# Patient Record
Sex: Male | Born: 1945 | Race: White | Hispanic: No | Marital: Married | Smoking: Former smoker
Health system: Southern US, Community
[De-identification: ages and names within clinical notes are randomized; demographics above are authoritative.]

## PROBLEM LIST (undated history)

## (undated) DIAGNOSIS — J708 Respiratory conditions due to other specified external agents: Secondary | ICD-10-CM

## (undated) DIAGNOSIS — J45909 Unspecified asthma, uncomplicated: Secondary | ICD-10-CM

## (undated) DIAGNOSIS — I219 Acute myocardial infarction, unspecified: Secondary | ICD-10-CM

## (undated) DIAGNOSIS — J449 Chronic obstructive pulmonary disease, unspecified: Secondary | ICD-10-CM

## (undated) DIAGNOSIS — I10 Essential (primary) hypertension: Secondary | ICD-10-CM

## (undated) DIAGNOSIS — N400 Enlarged prostate without lower urinary tract symptoms: Secondary | ICD-10-CM

## (undated) DIAGNOSIS — R911 Solitary pulmonary nodule: Secondary | ICD-10-CM

## (undated) DIAGNOSIS — E785 Hyperlipidemia, unspecified: Secondary | ICD-10-CM

## (undated) DIAGNOSIS — IMO0001 Reserved for inherently not codable concepts without codable children: Secondary | ICD-10-CM

## (undated) DIAGNOSIS — Z860100 Personal history of colon polyps, unspecified: Secondary | ICD-10-CM

## (undated) DIAGNOSIS — M1990 Unspecified osteoarthritis, unspecified site: Secondary | ICD-10-CM

## (undated) DIAGNOSIS — G4730 Sleep apnea, unspecified: Secondary | ICD-10-CM

## (undated) DIAGNOSIS — J189 Pneumonia, unspecified organism: Secondary | ICD-10-CM

## (undated) DIAGNOSIS — E1169 Type 2 diabetes mellitus with other specified complication: Secondary | ICD-10-CM

## (undated) DIAGNOSIS — R011 Cardiac murmur, unspecified: Secondary | ICD-10-CM

## (undated) DIAGNOSIS — I2510 Atherosclerotic heart disease of native coronary artery without angina pectoris: Secondary | ICD-10-CM

## (undated) DIAGNOSIS — M5126 Other intervertebral disc displacement, lumbar region: Secondary | ICD-10-CM

## (undated) DIAGNOSIS — Z87442 Personal history of urinary calculi: Secondary | ICD-10-CM

## (undated) DIAGNOSIS — E782 Mixed hyperlipidemia: Secondary | ICD-10-CM

## (undated) DIAGNOSIS — R0683 Snoring: Secondary | ICD-10-CM

## (undated) DIAGNOSIS — K219 Gastro-esophageal reflux disease without esophagitis: Secondary | ICD-10-CM

## (undated) DIAGNOSIS — T594X1A Toxic effect of chlorine gas, accidental (unintentional), initial encounter: Secondary | ICD-10-CM

## (undated) HISTORY — DX: Personal history of colonic polyps: Z86.010

## (undated) HISTORY — DX: Personal history of colon polyps, unspecified: Z86.0100

## (undated) HISTORY — PX: SPINE SURGERY: SHX786

## (undated) HISTORY — DX: Hyperlipidemia, unspecified: E78.5

## (undated) HISTORY — PX: TONSILLECTOMY: SUR1361

## (undated) HISTORY — DX: Benign prostatic hyperplasia without lower urinary tract symptoms: N40.0

## (undated) HISTORY — DX: Chronic obstructive pulmonary disease, unspecified: J44.9

## (undated) HISTORY — DX: Unspecified osteoarthritis, unspecified site: M19.90

## (undated) HISTORY — DX: Solitary pulmonary nodule: R91.1

## (undated) HISTORY — DX: Snoring: R06.83

## (undated) HISTORY — DX: Pneumonia, unspecified organism: J18.9

## (undated) HISTORY — DX: Atherosclerotic heart disease of native coronary artery without angina pectoris: I25.10

## (undated) HISTORY — DX: Essential (primary) hypertension: I10

## (undated) HISTORY — PX: CARDIOVASCULAR STRESS TEST: SHX262

---

## 1995-07-06 HISTORY — PX: OTHER SURGICAL HISTORY: SHX169

## 1996-07-05 DIAGNOSIS — J708 Respiratory conditions due to other specified external agents: Secondary | ICD-10-CM

## 1996-07-05 HISTORY — DX: Respiratory conditions due to other specified external agents: J70.8

## 1998-06-13 ENCOUNTER — Ambulatory Visit (HOSPITAL_COMMUNITY): Admission: RE | Admit: 1998-06-13 | Discharge: 1998-06-13 | Payer: Self-pay | Admitting: Family Medicine

## 1998-06-13 ENCOUNTER — Encounter: Payer: Self-pay | Admitting: Family Medicine

## 1998-08-13 ENCOUNTER — Ambulatory Visit (HOSPITAL_COMMUNITY): Admission: RE | Admit: 1998-08-13 | Discharge: 1998-08-13 | Payer: Self-pay | Admitting: Cardiology

## 1998-08-13 ENCOUNTER — Encounter: Payer: Self-pay | Admitting: Critical Care Medicine

## 1998-09-05 ENCOUNTER — Ambulatory Visit (HOSPITAL_COMMUNITY): Admission: RE | Admit: 1998-09-05 | Discharge: 1998-09-05 | Payer: Self-pay | Admitting: Cardiovascular Disease

## 1999-07-06 HISTORY — PX: OTHER SURGICAL HISTORY: SHX169

## 2004-01-29 ENCOUNTER — Encounter: Payer: Self-pay | Admitting: Internal Medicine

## 2004-05-18 ENCOUNTER — Ambulatory Visit: Payer: Self-pay | Admitting: Internal Medicine

## 2004-05-28 ENCOUNTER — Emergency Department (HOSPITAL_COMMUNITY): Admission: EM | Admit: 2004-05-28 | Discharge: 2004-05-29 | Payer: Self-pay | Admitting: Emergency Medicine

## 2004-06-24 ENCOUNTER — Ambulatory Visit: Payer: Self-pay | Admitting: Internal Medicine

## 2004-07-05 DIAGNOSIS — I2510 Atherosclerotic heart disease of native coronary artery without angina pectoris: Secondary | ICD-10-CM

## 2004-07-05 HISTORY — DX: Atherosclerotic heart disease of native coronary artery without angina pectoris: I25.10

## 2004-07-17 ENCOUNTER — Ambulatory Visit: Payer: Self-pay | Admitting: Cardiology

## 2004-07-28 ENCOUNTER — Ambulatory Visit: Payer: Self-pay

## 2004-07-28 ENCOUNTER — Ambulatory Visit: Payer: Self-pay | Admitting: Cardiology

## 2004-07-31 ENCOUNTER — Inpatient Hospital Stay (HOSPITAL_BASED_OUTPATIENT_CLINIC_OR_DEPARTMENT_OTHER): Admission: RE | Admit: 2004-07-31 | Discharge: 2004-07-31 | Payer: Self-pay | Admitting: Cardiology

## 2004-07-31 ENCOUNTER — Ambulatory Visit: Payer: Self-pay | Admitting: Cardiology

## 2004-08-13 ENCOUNTER — Ambulatory Visit: Payer: Self-pay | Admitting: Cardiology

## 2004-08-17 ENCOUNTER — Ambulatory Visit: Payer: Self-pay | Admitting: Internal Medicine

## 2004-09-16 ENCOUNTER — Ambulatory Visit: Payer: Self-pay | Admitting: Internal Medicine

## 2004-11-10 ENCOUNTER — Ambulatory Visit: Payer: Self-pay | Admitting: Internal Medicine

## 2004-11-23 ENCOUNTER — Ambulatory Visit: Payer: Self-pay | Admitting: Internal Medicine

## 2005-01-19 ENCOUNTER — Ambulatory Visit: Payer: Self-pay | Admitting: Internal Medicine

## 2005-02-23 ENCOUNTER — Ambulatory Visit: Payer: Self-pay | Admitting: Internal Medicine

## 2005-03-18 ENCOUNTER — Ambulatory Visit: Payer: Self-pay | Admitting: Internal Medicine

## 2005-05-13 ENCOUNTER — Ambulatory Visit: Payer: Self-pay | Admitting: Internal Medicine

## 2005-05-31 ENCOUNTER — Emergency Department (HOSPITAL_COMMUNITY): Admission: EM | Admit: 2005-05-31 | Discharge: 2005-06-01 | Payer: Self-pay | Admitting: Emergency Medicine

## 2005-06-07 ENCOUNTER — Ambulatory Visit: Payer: Self-pay | Admitting: Internal Medicine

## 2005-06-14 ENCOUNTER — Ambulatory Visit: Payer: Self-pay | Admitting: Pulmonary Disease

## 2005-06-18 ENCOUNTER — Encounter: Payer: Self-pay | Admitting: Internal Medicine

## 2005-06-18 ENCOUNTER — Ambulatory Visit: Payer: Self-pay | Admitting: Cardiology

## 2005-07-20 ENCOUNTER — Ambulatory Visit: Payer: Self-pay | Admitting: Internal Medicine

## 2005-07-29 ENCOUNTER — Ambulatory Visit: Payer: Self-pay | Admitting: Internal Medicine

## 2005-12-02 ENCOUNTER — Ambulatory Visit: Payer: Self-pay | Admitting: Gastroenterology

## 2005-12-24 ENCOUNTER — Encounter (INDEPENDENT_AMBULATORY_CARE_PROVIDER_SITE_OTHER): Payer: Self-pay | Admitting: *Deleted

## 2005-12-24 ENCOUNTER — Ambulatory Visit: Payer: Self-pay | Admitting: Gastroenterology

## 2006-01-19 ENCOUNTER — Ambulatory Visit: Payer: Self-pay | Admitting: Internal Medicine

## 2006-03-22 ENCOUNTER — Ambulatory Visit: Payer: Self-pay | Admitting: Internal Medicine

## 2006-04-21 ENCOUNTER — Ambulatory Visit: Payer: Self-pay | Admitting: Internal Medicine

## 2006-08-22 ENCOUNTER — Ambulatory Visit: Payer: Self-pay | Admitting: Internal Medicine

## 2006-08-22 LAB — CONVERTED CEMR LAB
ALT: 34 units/L (ref 0–40)
Albumin: 3.8 g/dL (ref 3.5–5.2)
BUN: 19 mg/dL (ref 6–23)
CO2: 31 meq/L (ref 19–32)
Calcium: 9.6 mg/dL (ref 8.4–10.5)
Chloride: 104 meq/L (ref 96–112)
Cholesterol: 118 mg/dL (ref 0–200)
Creatinine, Ser: 1.2 mg/dL (ref 0.4–1.5)
Creatinine,U: 378.8 mg/dL
GFR calc Af Amer: 80 mL/min
GFR calc non Af Amer: 66 mL/min
Glucose, Bld: 220 mg/dL — ABNORMAL HIGH (ref 70–99)
HDL: 36.1 mg/dL — ABNORMAL LOW (ref >39.0)
Hgb A1c MFr Bld: 10.5 % — ABNORMAL HIGH (ref 4.6–6.0)
LDL Cholesterol: 43 mg/dL (ref 0–99)
Microalb Creat Ratio: 11.6 mg/g (ref 0.0–30.0)
Microalb, Ur: 4.4 mg/dL — ABNORMAL HIGH (ref 0.0–1.9)
Phosphorus: 3.5 mg/dL (ref 2.3–4.6)
Potassium: 4.7 meq/L (ref 3.5–5.1)
Sodium: 143 meq/L (ref 135–145)
Total CHOL/HDL Ratio: 3.3
Triglycerides: 194 mg/dL — ABNORMAL HIGH (ref 0–149)
VLDL: 39 mg/dL (ref 0–40)

## 2006-09-05 ENCOUNTER — Ambulatory Visit: Payer: Self-pay | Admitting: Internal Medicine

## 2006-09-16 ENCOUNTER — Ambulatory Visit: Payer: Self-pay | Admitting: Endocrinology

## 2006-09-26 ENCOUNTER — Ambulatory Visit: Payer: Self-pay | Admitting: Endocrinology

## 2006-10-24 ENCOUNTER — Ambulatory Visit: Payer: Self-pay | Admitting: Endocrinology

## 2006-11-21 ENCOUNTER — Ambulatory Visit: Payer: Self-pay | Admitting: Endocrinology

## 2006-12-27 ENCOUNTER — Encounter: Payer: Self-pay | Admitting: Internal Medicine

## 2007-01-23 ENCOUNTER — Ambulatory Visit: Payer: Self-pay | Admitting: Endocrinology

## 2007-01-23 LAB — CONVERTED CEMR LAB
Creatinine,U: 206.9 mg/dL
Hgb A1c MFr Bld: 7.4 % — ABNORMAL HIGH (ref 4.6–6.0)
Microalb Creat Ratio: 3.4 mg/g (ref 0.0–30.0)
Microalb, Ur: 0.7 mg/dL (ref 0.0–1.9)

## 2007-02-02 ENCOUNTER — Encounter: Payer: Self-pay | Admitting: Internal Medicine

## 2007-02-02 DIAGNOSIS — I2510 Atherosclerotic heart disease of native coronary artery without angina pectoris: Secondary | ICD-10-CM

## 2007-02-02 DIAGNOSIS — N529 Male erectile dysfunction, unspecified: Secondary | ICD-10-CM

## 2007-02-14 DIAGNOSIS — E119 Type 2 diabetes mellitus without complications: Secondary | ICD-10-CM | POA: Insufficient documentation

## 2007-02-14 DIAGNOSIS — I10 Essential (primary) hypertension: Secondary | ICD-10-CM

## 2007-02-14 DIAGNOSIS — E785 Hyperlipidemia, unspecified: Secondary | ICD-10-CM | POA: Insufficient documentation

## 2007-02-14 DIAGNOSIS — J309 Allergic rhinitis, unspecified: Secondary | ICD-10-CM | POA: Insufficient documentation

## 2007-04-03 ENCOUNTER — Ambulatory Visit: Payer: Self-pay | Admitting: Internal Medicine

## 2007-04-03 LAB — CONVERTED CEMR LAB: PSA: 0.75 ng/mL (ref 0.10–4.00)

## 2007-05-19 ENCOUNTER — Ambulatory Visit: Payer: Self-pay | Admitting: Internal Medicine

## 2007-06-26 ENCOUNTER — Ambulatory Visit: Payer: Self-pay | Admitting: Endocrinology

## 2007-06-26 DIAGNOSIS — R05 Cough: Secondary | ICD-10-CM

## 2007-06-26 LAB — CONVERTED CEMR LAB: Hgb A1c MFr Bld: 7.2 % — ABNORMAL HIGH (ref 4.6–6.0)

## 2007-06-27 ENCOUNTER — Telehealth: Payer: Self-pay | Admitting: Endocrinology

## 2007-07-12 ENCOUNTER — Telehealth (INDEPENDENT_AMBULATORY_CARE_PROVIDER_SITE_OTHER): Payer: Self-pay | Admitting: *Deleted

## 2007-07-13 ENCOUNTER — Telehealth (INDEPENDENT_AMBULATORY_CARE_PROVIDER_SITE_OTHER): Payer: Self-pay | Admitting: *Deleted

## 2007-09-25 ENCOUNTER — Ambulatory Visit: Payer: Self-pay | Admitting: Endocrinology

## 2007-09-25 LAB — CONVERTED CEMR LAB: Hgb A1c MFr Bld: 6.8 % — ABNORMAL HIGH (ref 4.6–6.0)

## 2007-09-26 ENCOUNTER — Ambulatory Visit: Payer: Self-pay | Admitting: Cardiology

## 2007-10-03 ENCOUNTER — Telehealth (INDEPENDENT_AMBULATORY_CARE_PROVIDER_SITE_OTHER): Payer: Self-pay | Admitting: *Deleted

## 2007-10-06 ENCOUNTER — Encounter: Payer: Self-pay | Admitting: Endocrinology

## 2007-10-06 ENCOUNTER — Ambulatory Visit: Payer: Self-pay

## 2007-10-09 ENCOUNTER — Ambulatory Visit: Payer: Self-pay

## 2007-10-19 ENCOUNTER — Telehealth: Payer: Self-pay | Admitting: Endocrinology

## 2007-10-23 ENCOUNTER — Telehealth (INDEPENDENT_AMBULATORY_CARE_PROVIDER_SITE_OTHER): Payer: Self-pay | Admitting: *Deleted

## 2007-10-30 ENCOUNTER — Ambulatory Visit: Payer: Self-pay | Admitting: Internal Medicine

## 2007-10-30 DIAGNOSIS — J984 Other disorders of lung: Secondary | ICD-10-CM

## 2007-10-30 DIAGNOSIS — R0602 Shortness of breath: Secondary | ICD-10-CM | POA: Insufficient documentation

## 2007-10-31 ENCOUNTER — Telehealth (INDEPENDENT_AMBULATORY_CARE_PROVIDER_SITE_OTHER): Payer: Self-pay | Admitting: *Deleted

## 2007-11-06 ENCOUNTER — Telehealth: Payer: Self-pay | Admitting: Internal Medicine

## 2007-11-06 ENCOUNTER — Ambulatory Visit: Payer: Self-pay | Admitting: Internal Medicine

## 2007-11-08 ENCOUNTER — Telehealth (INDEPENDENT_AMBULATORY_CARE_PROVIDER_SITE_OTHER): Payer: Self-pay | Admitting: *Deleted

## 2007-11-13 ENCOUNTER — Ambulatory Visit: Payer: Self-pay | Admitting: Internal Medicine

## 2007-12-04 ENCOUNTER — Ambulatory Visit: Payer: Self-pay | Admitting: Internal Medicine

## 2007-12-04 DIAGNOSIS — J439 Emphysema, unspecified: Secondary | ICD-10-CM | POA: Insufficient documentation

## 2007-12-04 DIAGNOSIS — I270 Primary pulmonary hypertension: Secondary | ICD-10-CM

## 2007-12-25 ENCOUNTER — Ambulatory Visit: Payer: Self-pay | Admitting: Endocrinology

## 2007-12-25 LAB — CONVERTED CEMR LAB: Hgb A1c MFr Bld: 7.2 % — ABNORMAL HIGH (ref 4.6–6.0)

## 2008-01-22 ENCOUNTER — Ambulatory Visit: Payer: Self-pay | Admitting: Internal Medicine

## 2008-01-24 LAB — CONVERTED CEMR LAB
ALT: 33 units/L (ref 0–53)
AST: 26 units/L (ref 0–37)
Albumin: 3.8 g/dL (ref 3.5–5.2)
Alkaline Phosphatase: 60 units/L (ref 39–117)
BUN: 21 mg/dL (ref 6–23)
Basophils Absolute: 0 10*3/uL (ref 0.0–0.1)
Basophils Relative: 0.2 % (ref 0.0–3.0)
Bilirubin, Direct: 0.1 mg/dL (ref 0.0–0.3)
CO2: 28 meq/L (ref 19–32)
Calcium: 9 mg/dL (ref 8.4–10.5)
Chloride: 107 meq/L (ref 96–112)
Cholesterol: 101 mg/dL (ref 0–200)
Creatinine, Ser: 1.4 mg/dL (ref 0.4–1.5)
Eosinophils Absolute: 0.1 10*3/uL (ref 0.0–0.7)
Eosinophils Relative: 0.9 % (ref 0.0–5.0)
GFR calc Af Amer: 67 mL/min
GFR calc non Af Amer: 55 mL/min
Glucose, Bld: 165 mg/dL — ABNORMAL HIGH (ref 70–99)
HCT: 43 % (ref 39.0–52.0)
HDL: 33.5 mg/dL — ABNORMAL LOW (ref >39.0)
Hemoglobin: 14.9 g/dL (ref 13.0–17.0)
LDL Cholesterol: 48 mg/dL (ref 0–99)
Lymphocytes Relative: 14.9 % (ref 12.0–46.0)
MCHC: 34.6 g/dL (ref 30.0–36.0)
MCV: 89.1 fL (ref 78.0–100.0)
Monocytes Absolute: 0.5 10*3/uL (ref 0.1–1.0)
Monocytes Relative: 6.1 % (ref 3.0–12.0)
Neutro Abs: 6.9 10*3/uL (ref 1.4–7.7)
Neutrophils Relative %: 77.9 % — ABNORMAL HIGH (ref 43.0–77.0)
PSA: 1.48 ng/mL (ref 0.10–4.00)
Phosphorus: 3 mg/dL (ref 2.3–4.6)
Platelets: 251 10*3/uL (ref 150–400)
Potassium: 4.3 meq/L (ref 3.5–5.1)
RBC: 4.83 M/uL (ref 4.22–5.81)
RDW: 13 % (ref 11.5–14.6)
Sodium: 142 meq/L (ref 135–145)
TSH: 0.67 microintl units/mL (ref 0.35–5.50)
Total Bilirubin: 0.9 mg/dL (ref 0.3–1.2)
Total CHOL/HDL Ratio: 3
Total Protein: 6.5 g/dL (ref 6.0–8.3)
Triglycerides: 96 mg/dL (ref 0–149)
VLDL: 19 mg/dL (ref 0–40)
WBC: 8.8 10*3/uL (ref 4.5–10.5)

## 2008-04-01 ENCOUNTER — Ambulatory Visit: Payer: Self-pay | Admitting: Endocrinology

## 2008-04-01 LAB — CONVERTED CEMR LAB: Hgb A1c MFr Bld: 7.3 % — ABNORMAL HIGH (ref 4.6–6.0)

## 2008-04-08 ENCOUNTER — Telehealth (INDEPENDENT_AMBULATORY_CARE_PROVIDER_SITE_OTHER): Payer: Self-pay | Admitting: *Deleted

## 2008-07-08 ENCOUNTER — Telehealth: Payer: Self-pay | Admitting: Internal Medicine

## 2008-08-12 ENCOUNTER — Ambulatory Visit: Payer: Self-pay | Admitting: Endocrinology

## 2008-08-12 LAB — CONVERTED CEMR LAB: Hgb A1c MFr Bld: 7.4 % — ABNORMAL HIGH (ref 4.6–6.0)

## 2008-08-26 ENCOUNTER — Telehealth: Payer: Self-pay | Admitting: Internal Medicine

## 2008-09-04 ENCOUNTER — Telehealth (INDEPENDENT_AMBULATORY_CARE_PROVIDER_SITE_OTHER): Payer: Self-pay | Admitting: *Deleted

## 2008-09-09 ENCOUNTER — Telehealth (INDEPENDENT_AMBULATORY_CARE_PROVIDER_SITE_OTHER): Payer: Self-pay | Admitting: *Deleted

## 2008-09-20 ENCOUNTER — Telehealth: Payer: Self-pay | Admitting: Internal Medicine

## 2008-11-20 ENCOUNTER — Telehealth: Payer: Self-pay | Admitting: Internal Medicine

## 2009-01-13 ENCOUNTER — Ambulatory Visit: Payer: Self-pay | Admitting: Endocrinology

## 2009-01-13 LAB — CONVERTED CEMR LAB: Hgb A1c MFr Bld: 7.8 % — ABNORMAL HIGH (ref 4.6–6.5)

## 2009-03-17 ENCOUNTER — Ambulatory Visit: Payer: Self-pay | Admitting: Internal Medicine

## 2009-03-19 LAB — CONVERTED CEMR LAB
ALT: 42 units/L (ref 0–53)
AST: 31 units/L (ref 0–37)
Albumin: 4.2 g/dL (ref 3.5–5.2)
Alkaline Phosphatase: 66 units/L (ref 39–117)
BUN: 18 mg/dL (ref 6–23)
Basophils Absolute: 0 10*3/uL (ref 0.0–0.1)
Basophils Relative: 0.2 % (ref 0.0–3.0)
Bilirubin, Direct: 0.1 mg/dL (ref 0.0–0.3)
CO2: 30 meq/L (ref 19–32)
Calcium: 9.2 mg/dL (ref 8.4–10.5)
Chloride: 106 meq/L (ref 96–112)
Cholesterol: 141 mg/dL (ref 0–200)
Creatinine, Ser: 1.3 mg/dL (ref 0.4–1.5)
Creatinine,U: 155.8 mg/dL
Eosinophils Absolute: 0.1 10*3/uL (ref 0.0–0.7)
Eosinophils Relative: 0.9 % (ref 0.0–5.0)
Glucose, Bld: 197 mg/dL — ABNORMAL HIGH (ref 70–99)
HCT: 47.2 % (ref 39.0–52.0)
HDL: 43.8 mg/dL (ref >39.00)
Hemoglobin: 16.1 g/dL (ref 13.0–17.0)
Hgb A1c MFr Bld: 7.7 % — ABNORMAL HIGH (ref 4.6–6.5)
LDL Cholesterol: 62 mg/dL (ref 0–99)
Lymphocytes Relative: 12.4 % (ref 12.0–46.0)
Lymphs Abs: 1.4 10*3/uL (ref 0.7–4.0)
MCHC: 34 g/dL (ref 30.0–36.0)
MCV: 89.5 fL (ref 78.0–100.0)
Microalb Creat Ratio: 21.8 mg/g (ref 0.0–30.0)
Microalb, Ur: 3.4 mg/dL — ABNORMAL HIGH (ref 0.0–1.9)
Monocytes Absolute: 0.5 10*3/uL (ref 0.1–1.0)
Monocytes Relative: 4.4 % (ref 3.0–12.0)
Neutro Abs: 8.9 10*3/uL — ABNORMAL HIGH (ref 1.4–7.7)
Neutrophils Relative %: 82.1 % — ABNORMAL HIGH (ref 43.0–77.0)
PSA: 0.58 ng/mL (ref 0.10–4.00)
Phosphorus: 2.7 mg/dL (ref 2.3–4.6)
Platelets: 250 10*3/uL (ref 150.0–400.0)
Potassium: 4.3 meq/L (ref 3.5–5.1)
RBC: 5.27 M/uL (ref 4.22–5.81)
RDW: 12.7 % (ref 11.5–14.6)
Sodium: 142 meq/L (ref 135–145)
TSH: 0.75 microintl units/mL (ref 0.35–5.50)
Total Bilirubin: 0.9 mg/dL (ref 0.3–1.2)
Total CHOL/HDL Ratio: 3
Total Protein: 7.6 g/dL (ref 6.0–8.3)
Triglycerides: 174 mg/dL — ABNORMAL HIGH (ref 0.0–149.0)
VLDL: 34.8 mg/dL (ref 0.0–40.0)
WBC: 10.9 10*3/uL — ABNORMAL HIGH (ref 4.5–10.5)

## 2009-05-01 ENCOUNTER — Telehealth: Payer: Self-pay | Admitting: Internal Medicine

## 2009-05-05 ENCOUNTER — Ambulatory Visit: Payer: Self-pay | Admitting: Internal Medicine

## 2009-05-05 DIAGNOSIS — S335XXA Sprain of ligaments of lumbar spine, initial encounter: Secondary | ICD-10-CM

## 2009-05-12 ENCOUNTER — Telehealth: Payer: Self-pay | Admitting: Internal Medicine

## 2009-05-12 ENCOUNTER — Ambulatory Visit: Payer: Self-pay | Admitting: Internal Medicine

## 2009-05-16 ENCOUNTER — Telehealth: Payer: Self-pay | Admitting: Internal Medicine

## 2009-06-16 ENCOUNTER — Telehealth: Payer: Self-pay | Admitting: Internal Medicine

## 2009-06-22 ENCOUNTER — Encounter: Admission: RE | Admit: 2009-06-22 | Discharge: 2009-06-22 | Payer: Self-pay | Admitting: Sports Medicine

## 2009-07-03 ENCOUNTER — Encounter: Admission: RE | Admit: 2009-07-03 | Discharge: 2009-07-03 | Payer: Self-pay | Admitting: Sports Medicine

## 2009-07-25 ENCOUNTER — Encounter: Admission: RE | Admit: 2009-07-25 | Discharge: 2009-07-25 | Payer: Self-pay | Admitting: Sports Medicine

## 2009-08-04 ENCOUNTER — Ambulatory Visit: Payer: Self-pay | Admitting: Endocrinology

## 2009-08-04 LAB — CONVERTED CEMR LAB: Hgb A1c MFr Bld: 7.3 % — ABNORMAL HIGH (ref 4.6–6.5)

## 2009-09-15 ENCOUNTER — Telehealth: Payer: Self-pay | Admitting: Internal Medicine

## 2009-09-29 ENCOUNTER — Telehealth: Payer: Self-pay | Admitting: Endocrinology

## 2009-12-29 ENCOUNTER — Telehealth: Payer: Self-pay | Admitting: Internal Medicine

## 2009-12-29 ENCOUNTER — Encounter: Payer: Self-pay | Admitting: Internal Medicine

## 2010-01-01 ENCOUNTER — Encounter: Payer: Self-pay | Admitting: Internal Medicine

## 2010-01-06 ENCOUNTER — Ambulatory Visit: Payer: Self-pay | Admitting: Endocrinology

## 2010-02-02 ENCOUNTER — Ambulatory Visit: Payer: Self-pay | Admitting: Internal Medicine

## 2010-02-05 LAB — CONVERTED CEMR LAB
ALT: 46 units/L (ref 0–53)
AST: 30 units/L (ref 0–37)
Albumin: 4.1 g/dL (ref 3.5–5.2)
Alkaline Phosphatase: 53 units/L (ref 39–117)
Bilirubin, Direct: 0.1 mg/dL (ref 0.0–0.3)
Cholesterol: 171 mg/dL (ref 0–200)
HDL: 45.1 mg/dL (ref >39.00)
LDL Cholesterol: 90 mg/dL (ref 0–99)
Total Bilirubin: 0.7 mg/dL (ref 0.3–1.2)
Total CHOL/HDL Ratio: 4
Total Protein: 6.9 g/dL (ref 6.0–8.3)
Triglycerides: 180 mg/dL — ABNORMAL HIGH (ref 0.0–149.0)
VLDL: 36 mg/dL (ref 0.0–40.0)

## 2010-04-06 LAB — HM DIABETES EYE EXAM

## 2010-05-14 ENCOUNTER — Ambulatory Visit: Payer: Self-pay | Admitting: Internal Medicine

## 2010-05-14 DIAGNOSIS — N400 Enlarged prostate without lower urinary tract symptoms: Secondary | ICD-10-CM

## 2010-05-14 DIAGNOSIS — M1990 Unspecified osteoarthritis, unspecified site: Secondary | ICD-10-CM | POA: Insufficient documentation

## 2010-05-14 LAB — HM DIABETES FOOT EXAM

## 2010-05-18 LAB — CONVERTED CEMR LAB
ALT: 58 units/L — ABNORMAL HIGH (ref 0–53)
AST: 44 units/L — ABNORMAL HIGH (ref 0–37)
Albumin: 4.4 g/dL (ref 3.5–5.2)
Alkaline Phosphatase: 63 units/L (ref 39–117)
BUN: 15 mg/dL (ref 6–23)
Basophils Absolute: 0 10*3/uL (ref 0.0–0.1)
Basophils Relative: 0.3 % (ref 0.0–3.0)
Bilirubin, Direct: 0.1 mg/dL (ref 0.0–0.3)
CO2: 29 meq/L (ref 19–32)
Calcium: 9.5 mg/dL (ref 8.4–10.5)
Chloride: 102 meq/L (ref 96–112)
Cholesterol: 162 mg/dL (ref 0–200)
Creatinine, Ser: 1.1 mg/dL (ref 0.4–1.5)
Eosinophils Absolute: 0.1 10*3/uL (ref 0.0–0.7)
Eosinophils Relative: 1.2 % (ref 0.0–5.0)
GFR calc non Af Amer: 74.41 mL/min (ref >60)
Glucose, Bld: 153 mg/dL — ABNORMAL HIGH (ref 70–99)
HCT: 46.6 % (ref 39.0–52.0)
HDL: 45.5 mg/dL (ref >39.00)
Hemoglobin: 16.2 g/dL (ref 13.0–17.0)
Hgb A1c MFr Bld: 8.1 % — ABNORMAL HIGH (ref 4.6–6.5)
LDL Cholesterol: 83 mg/dL (ref 0–99)
Lymphocytes Relative: 17.8 % (ref 12.0–46.0)
Lymphs Abs: 1.7 10*3/uL (ref 0.7–4.0)
MCHC: 34.7 g/dL (ref 30.0–36.0)
MCV: 88.8 fL (ref 78.0–100.0)
Monocytes Absolute: 0.4 10*3/uL (ref 0.1–1.0)
Monocytes Relative: 4.4 % (ref 3.0–12.0)
Neutro Abs: 7.3 10*3/uL (ref 1.4–7.7)
Neutrophils Relative %: 76.3 % (ref 43.0–77.0)
PSA: 0.53 ng/mL (ref 0.10–4.00)
Phosphorus: 3.7 mg/dL (ref 2.3–4.6)
Platelets: 282 10*3/uL (ref 150.0–400.0)
Potassium: 4.1 meq/L (ref 3.5–5.1)
RBC: 5.25 M/uL (ref 4.22–5.81)
RDW: 13.7 % (ref 11.5–14.6)
Sodium: 140 meq/L (ref 135–145)
TSH: 1.05 microintl units/mL (ref 0.35–5.50)
Total Bilirubin: 0.7 mg/dL (ref 0.3–1.2)
Total CHOL/HDL Ratio: 4
Total Protein: 7.1 g/dL (ref 6.0–8.3)
Triglycerides: 169 mg/dL — ABNORMAL HIGH (ref 0.0–149.0)
VLDL: 33.8 mg/dL (ref 0.0–40.0)
WBC: 9.5 10*3/uL (ref 4.5–10.5)

## 2010-06-08 ENCOUNTER — Ambulatory Visit: Payer: Self-pay | Admitting: Endocrinology

## 2010-06-22 ENCOUNTER — Telehealth: Payer: Self-pay | Admitting: Endocrinology

## 2010-08-04 NOTE — Progress Notes (Signed)
Summary:  LEVITRA  Phone Note Refill Request Message from:  [facility] on September 15, 2009 11:52 AM  Refills Requested: Medication #1:  LEVITRA 20 MG  TABS As needed Form on your desk, ok to refill?   Method Requested: Fax to Mail Away Pharmacy Initial call taken by: Mervin Hack CMA [facility]),  September 15, 2009 11:52 AM  Follow-up for Phone Call        per Dr. Alphonsus Sias ok to refill, until physical Follow-up by: Mervin Hack CMA [facility]),  September 15, 2009 11:55 AM  Additional Follow-up for Phone Call Additional follow up Details #1::        Rx [facility]ed [facility] Additional Follow-up by: Mervin Hack CMA [facility]),  September 15, 2009 1:07 PM    Prescriptions: LEVITRA 20 MG  TABS (VARDENAFIL HCL) As needed  #30 x 3   Entered by:   Mervin Hack CMA (AAMA)   Authorized by:   Cindee Salt MD   Signed by:   Mervin Hack CMA (AAMA) on 09/15/2009   Method used:   Electronically to        [facility]* (mail-order)             ,          Ph: [phone]       Fax: [phone]   RxID:   [id number]

## 2010-08-04 NOTE — Assessment & Plan Note (Signed)
Summary: RS/D FROM 12/6 DIABETES/#/CD   Vital Signs:  Patient profile:   65 year old male Height:      74.75 inches (189.87 cm) Weight:      308 pounds (140 kg) O2 Sat:      95 % on Room air Temp:     97.0 degrees F (36.11 degrees C) oral Pulse rate:   94 / minute BP sitting:   180 / 84  (left arm) Cuff size:   large  Vitals Entered By: Josph Macho CMA (August 04, 2009 9:13 AM)  O2 Flow:  Room air CC: Follow-up visit/ CF Is Patient Diabetic? Yes   Referring Provider:  Dr. Juanito Doom Primary Provider:  Dr. Tillman Abide  CC:  Follow-up visit/ CF.  History of Present Illness: pt states he feels well in general, except for exacerbation of low-back pain.  he continues to work.  no cbg record, but states cbg's are well-controlled, even after steriod injections.  he has reduced his humalog to 60 units at a time, due to hypoglycemia.    Current Medications (verified): 1)  Clonidine Hcl 0.3 Mg  Tabs (Clonidine Hcl) .... Take One By Mouth Two Times A Day 2)  Lotrel 10-20 Mg  Caps (Amlodipine Besy-Benazepril Hcl) .... Take One By Mouth Once A Day 3)  Allegra 180 Mg  Tabs (Fexofenadine Hcl) .... Take One By Mouth Once A Day 4)  Fish Oil 1000 Mg  Caps (Omega-3 Fatty Acids) .... Take 2 By Mouth Two Times A Day 5)  Multivitamins   Tabs (Multiple Vitamin) .... Take One By Mouth Once A Day 6)  Potassium Chloride 20 Meq  Pack (Potassium Chloride) .... Take One By Mouth Once A Day 7)  Levitra 20 Mg  Tabs (Vardenafil Hcl) .... As Needed 8)  Humalog Pen 100 Unit/ml Soln (Insulin Lispro (Human)) .... Three Times A Day (Qac) 70-70-30 Units 9)  Simvastatin 80 Mg Tabs (Simvastatin) .... Take 1 Tablet By Mouth At Bedtime 10)  Furosemide 20 Mg Tabs (Furosemide) .... Take 1 Tablet By Mouth Every Morning 11)  Onetouch Ultra Test   Strp (Glucose Blood) .... Three Times A Day, 250.01, and Lancets 12)  Easy Touch Pen Needles 31g X 6 Mm Misc (Insulin Pen Needle) .... Any Brand, Tid 13)  Aspirin 81 Mg  Tabs (Aspirin) .... Take 1 By Mouth Once Daily 14)  Carisoprodol 350 Mg Tabs (Carisoprodol) .... 1/2 - 1 Tab By Mouth Three Times A Day As Needed For Muscle Spasm 15)  Ibuprofen 800 Mg Tabs (Ibuprofen) .Marland Kitchen.. 1 Tab By Mouth Three Times A Day With Food As Needed For Pain 16)  Hydrocodone-Acetaminophen 5-325 Mg Tabs (Hydrocodone-Acetaminophen) .Marland Kitchen.. 1 Tab Up To Four Times Daily As Needed For Severe Pain  Past History:  Past Medical History: Last updated: 05/05/2009 Hypertension Coronary artery disease - non obstructive on cath 2006-------------------------------Dr Wall Diabetes mellitus, type II----------------------------------------------------------------------Dr Delfino Lovett stage 2 COPD  - on pfts and ct chest. No desaturation on walking 11/2007. Started on spiriva 5/09 Allergic rhinitis Colonic polyps, hx of Hyperlipidemia Chemical Inhalation (Clorox, Muradic acid) in 1997 during cleanup of garage. Ended up in ER visit. Says CT chest showed "scar tissue" Jan 2006 - RHC - the right atrial pressure was 5 mean.  The right  ventricular pressure was 33/5.  The pulmonary artery pressure was 33/15 with  a mean of 24.  The pulmonary wedge pressure was 11.  Left ventricular  pressure was 122/18.  The aortic pressure was 122/74 with  a mean of 93.  Cardiac output/cardiac index was 5.3/2.1 L/minute/sq m. 6mm pulmonary nodule - stable Dec 2005 through Dec 2006 and May 2009 - no further followup  Review of Systems       he has loist a few lbs, due to his efforts  Physical Exam  General:  normal appearance.   Psych:  Alert and cooperative; normal mood and affect; normal attention span and concentration.   Additional Exam:  Hemoglobin A1C       [H]  7.3 %    Impression & Recommendations:  Problem # 1:  DIABETES MELLITUS, TYPE II (ICD-250.00) well-controlled for his situation  Medications Added to Medication List This Visit: 1)  Humalog Pen 100 Unit/ml Soln (Insulin lispro (human)) .... Three  times a day (qac) 60-60-30 units  Other Orders: TLB-A1C / Hgb A1C (Glycohemoglobin) (83036-A1C) Est. Patient Level III (13086)  Patient Instructions: 1)  take humalog 60 units with each meal (except for 30 with the meal at work). 2)  check your blood sugar 3 times a day.  vary the time of day when you check, between before the 3 meals, and at bedtime.  also check if you have symptoms of your blood sugar being too high or too low.  please keep a record of the readings and bring it to your next appointment here.  please call us sooner if you are having low blood sugar episodes. 3)  return 3 mos.

## 2010-08-04 NOTE — Medication Information (Signed)
Summary: Fax Regarding Simvastatin with Amlodipine Interaction/[facility]  Fax Regarding Simvastatin with Amlodipine Interaction/[facility]   Imported By: Lanelle Bal 01/06/2010 12:28:24  _____________________________________________________________________  External Attachment:    Type:   Image     Comment:   External Document

## 2010-08-04 NOTE — Assessment & Plan Note (Signed)
Summary: FOLLOW UP-LB   Vital Signs:  Patient profile:   65 year old male Height:      75 inches (190.50 cm) Weight:      316 pounds (143.64 kg) BMI:     39.64 O2 Sat:      92 % on Room air Temp:     98.4 degrees F (36.89 degrees C) oral Pulse rate:   90 / minute BP sitting:   140 / 84  (left arm) Cuff size:   large  Vitals Entered By: Brenton Grills CMA [facility]) (June 08, 2010 8:14 AM)  O2 Flow:  Room air CC: Follow-up visit/aj Is Patient Diabetic? Yes   Referring Provider:  Dr. Juanito Doom Primary Provider:  Dr. Tillman Abide  CC:  Follow-up visit/aj.  History of Present Illness: pt states he feels well in general.  no cbg record, but states cbg's are well-controlled.  due to hyperglycemia, he sometimes exceeds the 45 units of humalog at work.    Current Medications (verified): 1)  Pravastatin Sodium 80 Mg Tabs (Pravastatin Sodium) .... Take 1 By Mouth Once Daily 2)  Clonidine Hcl 0.3 Mg  Tabs (Clonidine Hcl) .... Take One By Mouth Two Times A Day 3)  Lotrel 10-20 Mg  Caps (Amlodipine Besy-Benazepril Hcl) .... Take One By Mouth Once A Day 4)  Potassium Chloride 20 Meq  Pack (Potassium Chloride) .... Take One By Mouth Once A Day 5)  Levitra 20 Mg  Tabs (Vardenafil Hcl) .... As Needed 6)  Humalog Pen 100 Unit/ml Soln (Insulin Lispro (Human)) .... Three Times A Day (Qac) 70-70-45 Units 7)  Furosemide 20 Mg Tabs (Furosemide) .... Take 1 Tablet By Mouth Every Morning 8)  Onetouch Ultra Test   Strp (Glucose Blood) .... Three Times A Day, 250.01, and Lancets 9)  Easy Touch Pen Needles 31g X 6 Mm Misc (Insulin Pen Needle) .... Any Brand, Tid 10)  Aspirin 81 Mg Tabs (Aspirin) .... Take 1 By Mouth Once Daily 11)  Fish Oil 1000 Mg  Caps (Omega-3 Fatty Acids) .... Take 2 By Mouth Two Times A Day 12)  Multivitamins   Tabs (Multiple Vitamin) .... Take One By Mouth Once A Day 13)  Clonazepam 0.5 Mg Tabs (Clonazepam) .Marland Kitchen.. 1-2 Tabs At Bedtime As Needed To Help Sleep and Legs  Allergies  (verified): No Known Drug Allergies  Past History:  Past Medical History: Last updated: 05/14/2010 Hypertension Coronary artery disease - non obstructive on cath 2006-------------------------------Dr Wall COPD  Gold's stage 2  - on pfts and ct chest. No desaturation on walking 11/2007. Started on spiriva 5/09 Allergic rhinitis Colonic polyps, hx of Hyperlipidemia Chemical Inhalation (Clorox, Muradic acid) in 1997 during cleanup of garage. Ended up in ER visit. Says CT chest          showed "scar tissue" Jan 2006 - RHC - the right atrial pressure was 5 mean.  The right  ventricular pressure was 33/5.  The pulmonary artery pressure was 33/15 with  a mean of 24.  The pulmonary wedge pressure was 11.  Left ventricular  pressure was 122/18.  The aortic pressure was 122/74 with a mean of 93.  Cardiac output/cardiac index was 5.3/2.1 L/minute/sq m. 6mm pulmonary nodule - stable Dec 2005 through Dec 2006 and May 2009 - no further followup Osteoarthritis Benign prostatic hypertrophy  Review of Systems  The patient denies hypoglycemia.    Physical Exam  General:  morbidly obese.  no distress  Pulses:  dorsalis pedis intact bilat.  Extremities:  no deformity.  no ulcer on the feet.  feet are of normal temp.  there is rust-colored discoloration c/w chronic venous insufficiency. 1+ right pedal edema and 1+ left pedal edema.   Neurologic:  sensation is intact to touch on the feet  Additional Exam:   Hemoglobin A1C       [H]  8.1 %     Impression & Recommendations:  Problem # 1:  DIABETES MELLITUS, TYPE II (ICD-250.00) needs increased rx  Medications Added to Medication List This Visit: 1)  Humalog Kwikpen 100 Unit/ml Soln (Insulin lispro (human)) .... 80 units three times a day (just before each meal), and pen needles three times a day  Other Orders: Est. Patient Level III (16109)  Patient Instructions: 1)  check your blood sugar 3 times a day.  vary the time of day when you  check, between before the 3 meals, and at bedtime.  also check if you have symptoms of your blood sugar being too high or too low.  please keep a record of the readings and bring it to your next appointment here.  please call us sooner if you are having low blood sugar episodes. 2)  change humalog to 80 units just before the evening meal, and 60 with breakfast or a meal at work.   3)  Please schedule a follow-up appointment in 3 months. 4)  (update: i left message on phone-tree:  rx as we discussed) Prescriptions: HUMALOG KWIKPEN 100 UNIT/ML SOLN (INSULIN LISPRO (HUMAN)) 80 units three times a day (just before each meal), and pen needles three times a day  #1 box x 11   Entered and Authorized by:   Minus Breeding MD   Signed by:   Minus Breeding MD on 06/08/2010   Method used:   Print then Give to Patient   RxID:   [id number] HUMALOG KWIKPEN 100 UNIT/ML SOLN (INSULIN LISPRO (HUMAN)) 80 units three times a day (just before each meal), and pen needles three times a day  #14 boxes x 3   Entered and Authorized by:   Minus Breeding MD   Signed by:   Minus Breeding MD on 06/08/2010   Method used:   Electronically to        [facility]* (retail)             ,          Ph: [phone]       Fax: [phone]   RxID:   [id number]    Orders Added: 1)  Est. Patient Level III [64403]

## 2010-08-04 NOTE — Progress Notes (Signed)
Summary: Simvastatin with Amlodipine  Phone Note Outgoing Call   Summary of Call: Please call there has been a new safety warning about the simvastatin along with amlodipine I think it would be safest if we just change him to pravastatin at the same 80mg  dose  Please fax form to [facility] set up lipid, hepatic in about 6 weeks Initial call taken by: Cindee Salt MD,  December 29, 2009 5:45 PM  Follow-up for Phone Call        left message on machine at home for patient to return my call.  DeShannon Smith CMA [facility])  December 30, 2009 2:36 PM   left message on machine at home for patient to return my call. form faxed to [facility] and scanned DeShannon Katrinka Blazing CMA [facility])  January 01, 2010 12:26 PM   Spoke with patient's wife and advised results. Rx sent to pharmacy and lab appt made. 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 Follow-up by: Mervin Hack CMA [facility]),  January 01, 2010 12:33 PM    New/Updated Medications: PRAVASTATIN SODIUM 80 MG TABS (PRAVASTATIN SODIUM) take 1 by mouth once daily Prescriptions: PRAVASTATIN SODIUM 80 MG TABS (PRAVASTATIN SODIUM) take 1 by mouth once daily  #30 x 1   Entered by:   Mervin Hack CMA (AAMA)   Authorized by:   Cindee Salt MD   Signed by:   Mervin Hack CMA (AAMA) on 01/01/2010   Method used:   Electronically to        [facility] [address] [phone]* (retail)       [address]       [place], [place]  [address]       Ph: [phone] or [phone]       Fax: [phone]   RxID:   [id number]

## 2010-08-04 NOTE — Progress Notes (Signed)
Summary: test strips  Phone Note Refill Request Message from:  Fax from Pharmacy on September 29, 2009 11:33 AM  Refills Requested: Medication #1:  ONETOUCH ULTRA TEST   STRP three times a day  Method Requested: Fax to [facility] Initial call taken by: Orlan Leavens,  September 29, 2009 11:33 AM    Prescriptions: Letta Pate ULTRA TEST   STRP (GLUCOSE BLOOD) three times a day, 250.01, and lancets  #300 x 3   Entered by:   Orlan Leavens   Authorized by:   Minus Breeding MD   Signed by:   Orlan Leavens on 09/29/2009   Method used:   Faxed to ...       [facility]* (mail-order)             ,          Ph: [phone]       Fax: [phone]   RxID:   [id number]

## 2010-08-04 NOTE — Medication Information (Signed)
Summary: Change from Simvastatin to Pravastatin/[facility]  Change from Simvastatin to Pravastatin/[facility]   Imported By: Lanelle Bal 01/15/2010 10:56:37  _____________________________________________________________________  External Attachment:    Type:   Image     Comment:   External Document

## 2010-08-04 NOTE — Assessment & Plan Note (Signed)
Summary: fu / rs'd from 6-20 /nws   Vital Signs:  Patient profile:   65 year old male Height:      75 inches Weight:      314 pounds BMI:     39.39 O2 Sat:      96 % on Room air Temp:     97.6 degrees F oral Pulse rate:   95 / minute BP sitting:   126 / 84  (left arm) Cuff size:   large  Vitals Entered By: Margaret Pyle, CMA (January 06, 2010 7:59 AM)  O2 Flow:  Room air CC: F/U - DM, Pt states he is no longer taking Hydrocodone, Carisoprodol or Ibuprofen. Is Patient Diabetic? Yes Did you bring your meter with you today? No   Referring Provider:  Dr. Juanito Doom Primary Provider:  Dr. Tillman Abide  CC:  F/U - DM, Pt states he is no longer taking Hydrocodone, and Carisoprodol or Ibuprofen.Marland Kitchen  History of Present Illness: pt says he does not feel well due to a l-spinal disc problems.  no cbg record, but states cbg's are higher over the past few mos.  he says there is no trend of the cbg's throughout the day.  he has increased his insulin to 70 units qac at home, and 45 units at work.    Allergies (verified): No Known Drug Allergies  Past History:  Past Medical History: Last updated: 05/05/2009 Hypertension Coronary artery disease - non obstructive on cath 2006-------------------------------Dr Wall Diabetes mellitus, type II----------------------------------------------------------------------Dr Delfino Lovett stage 2 COPD  - on pfts and ct chest. No desaturation on walking 11/2007. Started on spiriva 5/09 Allergic rhinitis Colonic polyps, hx of Hyperlipidemia Chemical Inhalation (Clorox, Muradic acid) in 1997 during cleanup of garage. Ended up in ER visit. Says CT chest showed "scar tissue" Jan 2006 - RHC - the right atrial pressure was 5 mean.  The right  ventricular pressure was 33/5.  The pulmonary artery pressure was 33/15 with  a mean of 24.  The pulmonary wedge pressure was 11.  Left ventricular  pressure was 122/18.  The aortic pressure was 122/74 with a mean of  93.  Cardiac output/cardiac index was 5.3/2.1 L/minute/sq m. 6mm pulmonary nodule - stable Dec 2005 through Dec 2006 and May 2009 - no further followup  Review of Systems  The patient denies hypoglycemia.    Physical Exam  General:  morbidly obese.  no distress  Pulses:  dorsalis pedis intact bilat.   Extremities:  no deformity.  no ulcer on the feet.  feet are of normal temp.  there is rust-colored discoloration c/w chronic venous insufficiency. 1+ right pedal edema and 1+ left pedal edema.   Neurologic:  sensation is intact to touch on the feet  Additional Exam:  a1c=8.5   Impression & Recommendations:  Problem # 1:  DIABETES MELLITUS, TYPE II (ICD-250.00) needs increased rx  Medications Added to Medication List This Visit: 1)  Humalog Pen 100 Unit/ml Soln (Insulin lispro (human)) .... Three times a day (qac) 70-70-45 units  Other Orders: TLB-A1C / Hgb A1C (Glycohemoglobin) (83036-A1C) Est. Patient Level III (09811)  Patient Instructions: 1)  good diet and exercise habits significanly improve the control of your diabetes.  please let me know if you wish to be referred to a dietician.  high blood sugar is very risky to your health.  you should see an eye doctor every year. 2)  controlling your blood pressure and cholesterol drastically reduces the damage diabetes does to your  body.  this also applies to quitting smoking.  please discuss these with your doctor.  you should take an aspirin every day, unless you have been advised by a doctor not to. 3)  check your blood sugar 3 times a day.  vary the time of day when you check, between before the 3 meals, and at bedtime.  also check if you have symptoms of your blood sugar being too high or too low.  please keep a record of the readings and bring it to your next appointment here.  please call us sooner if you are having low blood sugar episodes. 4)  blood tests are being ordered for you today.  please call [phone] to hear your test  results. 5)  pending the test results, please increase humalog to 70 units just before each meal, except 45 units with meals at work.   6)  Please schedule a follow-up appointment in 3 months. 7)  (update: i left message on phone-tree:  rx as we discussed)

## 2010-08-04 NOTE — Assessment & Plan Note (Signed)
Summary: CPX/CLE   Vital Signs:  Patient profile:   65 year old male Weight:      316 pounds Temp:     98.2 degrees F oral Pulse rate:   88 / minute Pulse rhythm:   regular BP sitting:   160 / 100  (left arm) Cuff size:   large  Vitals Entered By: Mervin Hack CMA Duncan Dull) (May 14, 2010 3:36 PM) CC: adult physical   History of Present Illness: Has had a "bad year"  ruptured disc in back--so much pain he wound up using walker went for 3 shots for back. Pain better but legs weaker After 2nd shot, legs were almost paralyzed. Did wear off fortunately didn't take 3rd sot still wiht soreness in hips and legs feel "like I'm 65 years old" has given out and fallen at times can't go out to stores or carry groceries Hasn't seen anyone again He fears nerve damage Stopped PT after the last injection--wasn't helping Still working but has been difficult  Noting dribbling and dripping after voiding no sig increased frequency (other than with furosemide) going on for 3 months or so  Diabetes has been okay dr Everardo All follows recent eye exam  Allergies: No Known Drug Allergies  Past History:  Past medical, surgical, family and social histories (including risk factors) reviewed for relevance to current acute and chronic problems.  Past Medical History: Hypertension Coronary artery disease - non obstructive on cath 2006-------------------------------Dr Wall COPD  Gold's stage 2  - on pfts and ct chest. No desaturation on walking 11/2007. Started on spiriva 5/09 Allergic rhinitis Colonic polyps, hx of Hyperlipidemia Chemical Inhalation (Clorox, Muradic acid) in 1997 during cleanup of garage. Ended up in ER visit. Says CT chest          showed "scar tissue" Jan 2006 - [facility] - the right atrial pressure was 5 mean.  The right  ventricular pressure was 33/5.  The pulmonary artery pressure was 33/15 with  a mean of 24.  The pulmonary wedge pressure was 11.  Left ventricular  pressure was 122/18.  The aortic pressure was 122/74 with a mean of 93.  Cardiac output/cardiac index was 5.3/2.1 L/minute/sq m. 6mm pulmonary nodule - stable Dec 2005 through Dec 2006 and May 2009 - no further followup Osteoarthritis Benign prostatic hypertrophy  Past Surgical History: Reviewed history from 04/03/2007 and no changes required. Spine surgery Cervical disc repair 1997 Cath (?VSD)  ~1954 Pneumonia (out pt)  ~2001 Stress test 2003? Cath negative 01/06  Family History: Reviewed history from 02/02/2007 and no changes required. Father: Died in his 57's, Prostate cancer, aneurysms (AAA) Mother: Alive Siblings: One sister No CAD, HtN, DM Prostate cancer- Dad Colon cancer- none  Social History: Reviewed history from 10/30/2007 and no changes required. 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  Worked at [employer] and was exposed to [employer]  Review of Systems General:  Complains of sleep disorder; not sleeping well now when he gets back after 3rd shift--used to be able to weight fairly stable wears seat belt. Eyes:  Denies double vision and vision loss-1 eye. ENT:  Denies decreased hearing and ringing in ears; teeth okay--overdue for dentist. CV:  Complains of palpitations and shortness of breath with exertion; denies chest pain or discomfort, difficulty breathing at night, difficulty breathing while lying down, fainting, and lightheadness. Resp:  Denies cough and shortness of  breath. GI:  Complains of constipation and nausea; denies abdominal pain, bloody stools, dark tarry stools, indigestion, and vomiting; occ queasy bowels okay with laxative every other day . GU:  Complains of erectile dysfunction and nocturia; denies urinary hesitancy; dribbling Nocturia x 1 in general. MS:  Complains of joint pain, low back  pain, and stiffness; denies joint swelling. Derm:  Denies lesion(s) and rash. Neuro:  Complains of headaches and weakness; denies numbness and tingling; occ headaches . Psych:  Complains of anxiety and depression; tough year. Heme:  Denies abnormal bruising and enlarge lymph nodes. Allergy:  Complains of seasonal allergies and sneezing; not bad recently.  Physical Exam  General:  alert and normal appearance.   Eyes:  pupils equal, pupils round, pupils reactive to light, and no optic disk abnormalities.   Ears:  R ear normal and L ear normal.   Mouth:  no erythema, no exudates, and no lesions.   Neck:  supple, no masses, no thyromegaly, and no cervical lymphadenopathy.   Lungs:  normal respiratory effort, no intercostal retractions, no accessory muscle use, and normal breath sounds.   Heart:  normal rate, regular rhythm, no murmur, and no gallop.   Abdomen:  soft, non-tender, and no masses.   Rectal:  no hemorrhoids and no masses.   Prostate:  no gland enlargement and no nodules.   Msk:  no joint tenderness and no joint swelling.   Pulses:  1+ in feet Extremities:  1+ edema to calves Neurologic:  mild symmetric weakness of legs Skin:  no suspicious lesions and no ulcerations.   Axillary Nodes:  No palpable lymphadenopathy Psych:  normally interactive, good eye contact, not anxious appearing, and not depressed appearing.    Diabetes Management Exam:    Foot Exam (with socks and/or shoes not present):       Sensory-Pinprick/Light touch:          Left medial foot (L-4): normal          Left dorsal foot (L-5): normal          Left lateral foot (S-1): normal          Right medial foot (L-4): normal          Right dorsal foot (L-5): normal          Right lateral foot (S-1): normal       Inspection:          Left foot: normal          Right foot: normal       Nails:          Left foot: thickened          Right foot: thickened    Eye Exam:       Eye Exam done elsewhere           Date: 04/06/2010          Results: no retinopathy          Done by: vision works    Impression & Recommendations:  Problem # 1:  PREVENTIVE HEALTH CARE (ICD-V70.0) Assessment Comment Only has had hard year will try clonzepam for sleep and legs due for PSA  Problem # 2:  OSTEOARTHRITIS (ICD-715.90) Assessment: Deteriorated doesn't want to try therapy again hopefully can increase his walking if he sleeps better  The following medications were removed from the medication list:    Ibuprofen 800 Mg Tabs (Ibuprofen) .Marland Kitchen... 1 tab by mouth three times a day with food  as needed for pain    Hydrocodone-acetaminophen 5-325 Mg Tabs (Hydrocodone-acetaminophen) .Marland Kitchen... 1 tab up to four times daily as needed for severe pain His updated medication list for this problem includes:    Aspirin 81 Mg Tabs (Aspirin) .Marland Kitchen... Take 1 by mouth once daily  Problem # 3:  BENIGN PROSTATIC HYPERTROPHY (ICD-600.00) Assessment: New early symptoms will try tamsulosin if worsens  Problem # 4:  HYPERLIPIDEMIA (ICD-272.4) Assessment: Unchanged  due for labs  His updated medication list for this problem includes:    Pravastatin Sodium 80 Mg Tabs (Pravastatin sodium) .Marland Kitchen... Take 1 by mouth once daily  Labs Reviewed: SGOT: 30 (02/02/2010)   SGPT: 46 (02/02/2010)   HDL:45.10 (02/02/2010), 43.80 (03/17/2009)  LDL:90 (02/02/2010), 62 (03/17/2009)  Chol:171 (02/02/2010), 141 (03/17/2009)  Trig:180.0 (02/02/2010), 174.0 (03/17/2009)  Orders: TLB-Lipid Panel (80061-LIPID) TLB-Hepatic/Liver Function Pnl (80076-HEPATIC) Venipuncture (32951)  Problem # 5:  HYPERTENSION (ICD-401.9) Assessment: Unchanged  high today but generally okay no changes now  His updated medication list for this problem includes:    Clonidine Hcl 0.3 Mg Tabs (Clonidine hcl) .Marland Kitchen... Take one by mouth two times a day    Lotrel 10-20 Mg Caps (Amlodipine besy-benazepril hcl) .Marland Kitchen... Take one by mouth once a day    Furosemide 20 Mg Tabs (Furosemide)  .Marland Kitchen... Take 1 tablet by mouth every morning  BP today: 160/100 Prior BP: 126/84 (01/06/2010)  Labs Reviewed: K+: 4.3 (03/17/2009) Creat: : 1.3 (03/17/2009)   Chol: 171 (02/02/2010)   HDL: 45.10 (02/02/2010)   LDL: 90 (02/02/2010)   TG: 180.0 (02/02/2010)  Orders: TLB-Renal Function Panel (80069-RENAL) TLB-CBC Platelet - w/Differential (85025-CBCD) TLB-TSH (Thyroid Stimulating Hormone) (84443-TSH)  Problem # 6:  DIABETES MELLITUS, TYPE II (ICD-250.00) Assessment: Comment Only  Dr Everardo All follows  wil check A1c with labs  His updated medication list for this problem includes:    Lotrel 10-20 Mg Caps (Amlodipine besy-benazepril hcl) .Marland Kitchen... Take one by mouth once a day    Humalog Pen 100 Unit/ml Soln (Insulin lispro (human)) .Marland Kitchen... Three times a day (qac) 70-70-45 units    Aspirin 81 Mg Tabs (Aspirin) .Marland Kitchen... Take 1 by mouth once daily  Orders: TLB-A1C / Hgb A1C (Glycohemoglobin) (83036-A1C)  Complete Medication List: 1)  Pravastatin Sodium 80 Mg Tabs (Pravastatin sodium) .... Take 1 by mouth once daily 2)  Clonidine Hcl 0.3 Mg Tabs (Clonidine hcl) .... Take one by mouth two times a day 3)  Lotrel 10-20 Mg Caps (Amlodipine besy-benazepril hcl) .... Take one by mouth once a day 4)  Potassium Chloride 20 Meq Pack (Potassium chloride) .... Take one by mouth once a day 5)  Levitra 20 Mg Tabs (Vardenafil hcl) .... As needed 6)  Humalog Pen 100 Unit/ml Soln (Insulin lispro (human)) .... Three times a day (qac) 70-70-45 units 7)  Furosemide 20 Mg Tabs (Furosemide) .... Take 1 tablet by mouth every morning 8)  Onetouch Ultra Test Strp (Glucose blood) .... Three times a day, 250.01, and lancets 9)  Easy Touch Pen Needles 31g X 6 Mm Misc (Insulin pen needle) .... Any brand, tid 10)  Aspirin 81 Mg Tabs (Aspirin) .... Take 1 by mouth once daily 11)  Fish Oil 1000 Mg Caps (Omega-3 fatty acids) .... Take 2 by mouth two times a day 12)  Multivitamins Tabs (Multiple vitamin) .... Take one by mouth  once a day 13)  Clonazepam 0.5 Mg Tabs (Clonazepam) .Marland Kitchen.. 1-2 tabs at bedtime as needed to help sleep and legs  Other Orders: TLB-PSA (Prostate Specific Antigen) (84153-PSA)  Patient Instructions: 1)  Please schedule a follow-up appointment in 6 months .  2)  Please call if the urinary problems worsen 3)  Call if the medicine to help sleep is not effective Prescriptions: CLONAZEPAM 0.5 MG TABS (CLONAZEPAM) 1-2 tabs at bedtime as needed to help sleep and legs  #60 x 0   Entered and Authorized by:   Cindee Salt MD   Signed by:   Cindee Salt MD on 05/14/2010   Method used:   Print then Give to Patient   RxID:   [id number]    Orders Added: 1)  TLB-PSA (Prostate Specific Antigen) [95284-XLK] 2)  TLB-A1C / Hgb A1C (Glycohemoglobin) [83036-A1C] 3)  TLB-Lipid Panel [80061-LIPID] 4)  TLB-Hepatic/Liver Function Pnl [80076-HEPATIC] 5)  Venipuncture [36415] 6)  TLB-Renal Function Panel [80069-RENAL] 7)  TLB-CBC Platelet - w/Differential [85025-CBCD] 8)  TLB-TSH (Thyroid Stimulating Hormone) [44010-UVO]    Current Allergies (reviewed today): No known allergies   Appended Document: CPX/CLE     Clinical Lists Changes  Orders: Added new Service order of Admin 1st Vaccine (53664) - Signed Added new Service order of Flu Vaccine 35yrs + [phone]) - Signed Observations: Added new observation of FLU VAX VIS: 01/27/10 version (05/14/2010 16:22) Added new observation of FLU VAXLOT: [id number] (05/14/2010 16:22) Added new observation of FLU VAXMFR: [employer]axosmith[employer]line (05/14/2010 16:22) Added new observation of FLU VAX EXP: 01/02/2011 (05/14/2010 16:22) Added new observation of FLU VAX DSE: 0.81ml (05/14/2010 16:22) Added new observation of FLU VAX: Fluvax 3+ (05/14/2010 16:22)  Flu Vaccine Consent Questions     Do you have a history of severe allergic reactions to this vaccine? no    Any prior history of allergic reactions to egg and/or gelatin? no    Do you have a  sensitivity to the preservative Thimersol? no    Do you have a past history of Guillan-Barre Syndrome? no    Do you currently have an acute febrile illness? no    Have you ever had a severe reaction to latex? no    Vaccine information given and explained to patient? yes    Are you currently pregnant? no    Lot Number:[id number]   Exp Date:01/02/2011   Site Given  Left Deltoid IM    .[email or web address]

## 2010-08-06 NOTE — Progress Notes (Signed)
Summary: Rx clarification  Phone Note From Pharmacy   Caller: [facility] [phone] ref [id number] Summary of Call: Pharmacy called requesting clarification of pt's Rx frp Insulin Humalin N 80u three times a day which per pharmacist "seems to be a large qauntity and increase from last Rx", please advise. Initial call taken by: Margaret Pyle, CMA,  June 22, 2010 11:06 AM  Follow-up for Phone Call        med list says "humalog" is 80 tid, and is correct Follow-up by: Minus Breeding MD,  June 22, 2010 1:08 PM  Additional Follow-up for Phone Call Additional follow up Details #1::        [facility] informed Additional Follow-up by: Margaret Pyle, CMA,  June 22, 2010 3:20 PM

## 2010-09-12 ENCOUNTER — Encounter: Payer: Self-pay | Admitting: Internal Medicine

## 2010-09-14 ENCOUNTER — Encounter: Payer: Self-pay | Admitting: Internal Medicine

## 2010-10-17 ENCOUNTER — Other Ambulatory Visit: Payer: Self-pay | Admitting: Endocrinology

## 2010-10-19 ENCOUNTER — Encounter: Payer: Self-pay | Admitting: Endocrinology

## 2010-10-19 ENCOUNTER — Ambulatory Visit (INDEPENDENT_AMBULATORY_CARE_PROVIDER_SITE_OTHER): Payer: BLUE CROSS/BLUE SHIELD | Admitting: Endocrinology

## 2010-10-19 ENCOUNTER — Other Ambulatory Visit (INDEPENDENT_AMBULATORY_CARE_PROVIDER_SITE_OTHER): Payer: BLUE CROSS/BLUE SHIELD

## 2010-10-19 VITALS — BP 152/86 | HR 88 | Temp 98.0°F | Ht 75.0 in | Wt 319.8 lb

## 2010-10-19 DIAGNOSIS — E119 Type 2 diabetes mellitus without complications: Secondary | ICD-10-CM

## 2010-10-19 NOTE — Progress Notes (Signed)
  Subjective:    Patient ID: Dennis Vasquez, male    DOB: 1945-07-28, 65 y.o.   MRN: [id number]  HPI pt states he feels well in general.  no cbg record, but states cbg's are well-controlled.  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   Review of Systems denies hypoglycemia.    Objective:   Physical Exam Pulses: dorsalis pedis intact bilat.   Feet: no deformity.  no ulcer on the feet.  feet are of normal color and temp.  There is 2+ bilat leg edema.  There is bilat spotty rust color of the feet. Neuro: sensation is intact to touch on the feet.       Assessment & Plan:  Dm.  rx limited by 3rd shift work, poor diet and exercise, and lack of cbg record.

## 2010-10-19 NOTE — Patient Instructions (Addendum)
check your blood sugar 3 times a day.  vary the time of day when you check, between before the 3 meals, and at bedtime.  also check if you have symptoms of your blood sugar being too high or too low.  please keep a record of the readings and bring it to your next appointment here.  please call us sooner if you are having low blood sugar episodes. blood tests are being ordered for you today.  please call [phone] to hear your test results. pending the test results, please continue humalog to 80 units just before the evening meal, and 60 with breakfast or a meal at work.   Please schedule a follow-up appointment in 3 months.

## 2010-11-09 ENCOUNTER — Ambulatory Visit: Payer: Self-pay | Admitting: Internal Medicine

## 2010-11-17 NOTE — Assessment & Plan Note (Signed)
[facility]                            CARDIOLOGY OFFICE NOTE   NAME:Lacks, JABREE REBERT                      MRN:          [id number]  DATE:09/26/2007                            DOB:          [birth year]    Ms. Hardie Pulley comes in today per the request of Dr. Romero Belling with  increased shortness of breath and excess fatigue while chain sawing and  weed eating.   Mr.  Kahl is a delightful 65 year old married white male who comes  with his wife today, taking care of a lot of his family.   We saw him several years ago at which time he had some chest pain and  then underwent catheterization.  He had a right left heart  catheterization which showed nonobstructive coronary disease and mild  pulmonary hypertension.  He also had a 2-D echocardiogram which was  overall normal.   Recently, he has been out trying to do some chain sawing and spring  cleaning.  He became profoundly short of breath,  felt like he was going  to pass out.  He has been very fatigued and just has not felt well.  He  has also had the same to some lesser degree with weed eating.   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 He is now on three x a day insulin totaling a total of  230 units a day just to control his blood sugar.  Apparently, he was not  tolerating Actos and metformin and glipizide very well per his report.  I have no records on this.   He says his appetite is not increased but he has gained about 50 pounds  in the last year. He is up to 317.   PAST MEDICAL HISTORY:   ALLERGIES:  HE IS INTOLERANT OF PENICILLIN.   He does not smoke.  He smoked up until February 2005, at which time he  quit.  He does not use recreational drugs.   He does not exercise on a regular basis.   He has no dye allergy.   CURRENT MEDS:  1. Fish oil 1000 mg p.o. b.i.d.  2. Potassium 10 mEq a day.  3. Clonidine 0.3 mg p.o. b.i.d.  4. Humalog t.i.d.  as I mentioned above.  5. Multivitamin daily.  6. Aspirin 325 mg a day.  7. Allegra 180 mg a day.  8. Zocor 80 mg a day.  9. Furosemide 20 mg a day.  10.Lotrel 10/20 daily.   FAMILY HISTORY:  Is negative for premature coronary disease.   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  He is married, has one child.   REVIEW OF SYSTEMS:  Other than the HPI, is some seasonal allergies.  All  other review of systems were reviewed and are negative.   PHYSICAL EXAMINATION:  He is 6 feet, 4 inches. He weighs  322 pounds on  our scales. His blood pressure is 140/79, left arm.  His pulse 75 and  regular.  HEENT:  Normocephalic,  atraumatic.  PERRLA.  Extraocular is intact.  Sclerae are clear.  Face symmetry normal.  Dentition satisfactory though  his teeth are split fairly wide on the upper level.  He looks extremely  tired.  SKIN: Is warm and dry.  NECK: Is supple.  Carotid upstrokes were equal bilaterally without  bruits.  There is no JVD.  Thyroid is not enlarged.  Trachea is midline.  LUNGS:  Clear.  HEART:  Reveals a poorly appreciated PMI.  Is normal S1-S2 without  murmur, rub or gallop.  ABDOMEN:  Protuberant, good bowel sounds.  No midline bruit.  No obvious  organomegaly though difficult to assess.  EXTREMITIES:  No cyanosis, clubbing, has 1+ pretibial edema.  Pulses are  intact.  NEURO:  Exam is intact.  No sign of DVT.   His electrocardiogram shows sinus rhythm with incomplete right bundle.   ASSESSMENT/PLAN:  Mr. Frei has had profound dyspnea on exertion with  recent yard work and has multiple risk factors for progressive coronary  disease.  Though his catheterization was fairly benign in 2005, I am  very concerned about obstructive disease.  This could also just be  deconditioning and of course his weight has vastly increased.   PLAN:  Exercise rest/stress Myoview.   See him back at that time.  If this is negative for  ischemia, will call  Dr. Romero Belling and see if we can work out a way to reduce his weight  with his insulin program.  As I told him, the insulin is  control his blood sugar, though it is making him eat and store a lot  more fat.  He also has a very unhealthy lifestyle as far as his work  schedule.     Thomas C. Daleen Squibb, MD, [facility]  Electronically Signed    TCW/MedQ  DD: 09/26/2007  DT: 09/26/2007  Job #: [id number]   cc:   Karie Schwalbe, MD  Cleophas Dunker Everardo All, MD

## 2010-11-20 NOTE — Cardiovascular Report (Signed)
NAME:  EMMETTE, KATT NO.:  [id number]   MEDICAL RECORD NO.:  [id number]          PATIENT TYPE:  OIB   LOCATION:  6501                         FACILITY:  [facility]   PHYSICIAN:  Charlies Constable, M.D. [facility] DATE OF BIRTH:  [birth year]   DATE OF PROCEDURE:  07/31/2004  DATE OF DISCHARGE:                              CARDIAC CATHETERIZATION   CLINICAL HISTORY:  Mr. 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  He recently saw Dr. Daleen Squibb for symptoms of  shortness of breath with exertion.  He has a high risk factor profile with  diabetes and continued cigarette use.  Dr. Daleen Squibb elected to evaluate him with  right and left heart catheterization and coronary angiography which is  scheduled today.  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   PROCEDURE:  Right heart catheterization was performed percutaneously through  the right femoral vein using a medium sheath and Swan-Ganz thermodilution  catheter.  Left heart catheterization was performed percutaneously through  the right femoral artery using arterial sheath and 6-French preformed  coronary catheters.  A front wall arterial puncture was performed and  Omnipaque contrast was used.  The patient tolerated the procedure well and  left the laboratory in satisfactory condition.   RESULTS:   HEMODYNAMIC DATA:  The right atrial pressure was 5 mean.  The right  ventricular pressure was 33/5.  The pulmonary artery pressure was 33/15 with  a mean of 24.  The pulmonary wedge pressure was 11.  Left ventricular  pressure was 122/18.  The aortic pressure was 122/74 with a mean of 93.  Cardiac output/cardiac index was 5.3/2.1 L/minute/sq m.   ANGIOGRAPHIC DATA:  Left main coronary artery:  Free of significant disease.   Left anterior descending artery:  Gave rise to two septal perforators and a  diagonal branch.  There is  30% narrowing in the proximal LAD.  There are  irregularities in the proximal mid LAD, but no other major obstruction.   Circumflex artery:  Gave rise to two marginal branches, a third large  marginal branch, and a posterolateral branch.  These vessels were free of  significant disease.   Right coronary artery:  Moderate size vessel.  Gave rise to a right  ventricular branch, a posterior descending branch, and three posterolateral  branches.  There was 40% narrowing in the mid right coronary artery and  irregularities in the mid vessel.  There was 70% ostial stenosis of a small  acute marginal branch.   LEFT VENTRICULOGRAM:  The left ventriculogram performed in the RAO  projection showed good wall motion with no areas of hypokinesis.  The  estimated ejection fraction was 60%.   CONCLUSIONS:  1.  Mild nonobstructive coronary artery disease with 30% narrowing in the      proximal left anterior descending, no significant obstruction of      circumflex artery, and 40% narrowing in the mid right coronary artery  with normal left ventricular function.  2.  Normal pulmonary wedge and pulmonary artery pressures.   RECOMMENDATIONS:  Patient has nonobstructive coronary disease, but has no  cardiac source of dyspnea that we can identify.  Will plan to reassure the  patient about this and consider further pulmonary evaluation for etiology of  shortness of breath.  He is a continued smoker and does have a history of  chemical damage to his lungs.      BB/MEDQ  D:  07/31/2004  T:  07/31/2004  Job:  [id number]   cc:   Thomas C. Wall, M.D.   [facility] Lab

## 2010-11-20 NOTE — Consult Note (Signed)
[facility]                          ENDOCRINOLOGY CONSULTATION   NAME:Dennis Vasquez, Dennis Vasquez                      MRN:          [id number]  DATE:09/16/2006                            DOB:          April 09, 1946    REFERRING PHYSICIAN:  Dr. Alphonsus Sias.   REASON FOR REFERRAL:  Diabetes.   HISTORY OF PRESENT ILLNESS:  A 65 year old man who reports a 6 year  history of diabetes.  He is unaware of any chronic complications.  He  states his diabetes was well controlled last year on Avandia, glipizide,  and Metformin, but he suffered edema on this.  Because of safety  concerns surrounding Avandia, it was changed to Precose.  His glucose  has increased.  He had 6 months of severe flatulence with associated  cramps in the abdomen.  Actos was added to try to improved the glucose,  but there has only been slight improvement since then.  He describes his  diet as okay and his exercise as none.   PAST MEDICAL HISTORY:  1. Allergic rhinitis.  2. Dyslipidemia.  3. Hypertension.   SOCIAL HISTORY:  He is married.  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   FAMILY HISTORY:  Negative for diabetes.   REVIEW OF SYSTEMS:  Denies shortness of breath.  He denies numbness.  He  lost significant amount of weight when he discontinue his Avandia, but  he states he is starting to put it back on again.   PHYSICAL EXAMINATION:  VITAL SIGNS:  Blood pressure 146/77.  Heart rate  is 86.  Temperature is 97.4.  The weight is 285.  GENERAL:  Overweight, no distress.  SKIN:  No rash, not diaphoretic.  HEENT:  No proptosis, no periorbital swelling.  Pharynx normal.  NECK:  Supple.  No goiter.  CHEST:  Clear to auscultation.  There is 1+ bilateral pretibial edema,  regular rate and rhythm.  No murmur.  Pedal pulses are decreased,  probably due to the edema.  FEET:  Normal color and temperature.  There is no ulcer present on the  feet.  NEUROLOGIC:   Alert, well-oriented.  Sensation is intact to touch in the  feet.   LABORATORY STUDIES:  Forwarded by Dr. Alphonsus Sias.  On January 19, 2006,  hemoglobin A1c 6.9%.  On August 22, 2006, A1c is 10.5%.  Urine  microalbumin is negative on August 22, 2006.   IMPRESSION:  1. Type 2 diabetes.  I think his A1c has increased mostly due to      progression of his disease rather than medication changes.  2. Gastrointestinal symptoms of cramps and flatulence due to the      Precose.  3. TZD therapy is limited by edema.   PLAN:  1. We discussed the risk of diabetes and the importance of diet and      exercise therapy.  2. I told him he should consider starting insulin and after some      discussion, he agrees.  He is referred to Georgiana Spinner, CDE, at      [facility].  He will start taking      Humalog or NovoLog 5 units t.i.d. (q.a.c.).  At the time he starts      this, he will discontinue his Actos and Precose.  I have asked him      to increase this p.r.n. elevated glucoses.  I told him further that      when his glucoses normalize, to discontinue the Metformin and      Glipizide and then further titrate his insulin.  3. Return in a few weeks.     Sean A. Everardo All, MD  Electronically Signed    SAE/MedQ  DD: 09/18/2006  DT: 09/19/2006  Job #: [id number]

## 2010-12-07 ENCOUNTER — Encounter: Payer: Self-pay | Admitting: Internal Medicine

## 2010-12-07 ENCOUNTER — Ambulatory Visit (INDEPENDENT_AMBULATORY_CARE_PROVIDER_SITE_OTHER): Payer: Self-pay | Admitting: Internal Medicine

## 2010-12-07 VITALS — BP 142/80 | HR 76 | Temp 98.2°F | Ht 75.0 in | Wt 309.0 lb

## 2010-12-07 DIAGNOSIS — E785 Hyperlipidemia, unspecified: Secondary | ICD-10-CM

## 2010-12-07 DIAGNOSIS — L918 Other hypertrophic disorders of the skin: Secondary | ICD-10-CM | POA: Insufficient documentation

## 2010-12-07 DIAGNOSIS — M519 Unspecified thoracic, thoracolumbar and lumbosacral intervertebral disc disorder: Secondary | ICD-10-CM | POA: Insufficient documentation

## 2010-12-07 DIAGNOSIS — I2510 Atherosclerotic heart disease of native coronary artery without angina pectoris: Secondary | ICD-10-CM

## 2010-12-07 DIAGNOSIS — I10 Essential (primary) hypertension: Secondary | ICD-10-CM

## 2010-12-07 DIAGNOSIS — L909 Atrophic disorder of skin, unspecified: Secondary | ICD-10-CM

## 2010-12-07 DIAGNOSIS — N400 Enlarged prostate without lower urinary tract symptoms: Secondary | ICD-10-CM

## 2010-12-07 MED ORDER — CLONAZEPAM 0.5 MG PO TABS: 0.5000 mg | ORAL_TABLET | Freq: Every evening | ORAL | Status: DC

## 2010-12-07 NOTE — Assessment & Plan Note (Signed)
Has been quiet On aspirin, statin

## 2010-12-07 NOTE — Assessment & Plan Note (Signed)
This is his biggest limiting factor

## 2010-12-07 NOTE — Assessment & Plan Note (Signed)
Liquid nitrogen 45 seconds x 2 Discussed home care 

## 2010-12-07 NOTE — Progress Notes (Signed)
Subjective:    Patient ID: Dennis Vasquez, male    DOB: 1946-01-11, 65 y.o.   MRN: [id number]  HPI DOing okay Diabetes control has improved Has cut back on the lunch insulin Lab Results  Component Value Date   HGBA1C 7.7* 10/19/2010   Went back to Dr Farris Has for his back Leaned over to pick up something and strained it Better with some muscle relaxer Still feels his legs are a little weak Still has sig limitation--remains sedentary at work  Still with urinary problems Some dribbling after No problems with urgency or frequency Stable nocturia x 1 generally  Stable resp status No exercise so limited tolerance--but really limited by legs No chest pain Stable ankle edema  Has bothersome skin lesion on right neck Keeps getting caught on shirt and painful  Current outpatient prescriptions:amLODipine-benazepril (LOTREL) 10-20 MG per capsule, Take 1 capsule by mouth daily.  , Disp: , Rfl: ;  aspirin 81 MG tablet, Take 81 mg by mouth daily.  , Disp: , Rfl: ;  clonazePAM (KLONOPIN) 0.5 MG tablet, Take by mouth. Take 1-2 tablets by mouth at bedtime as needed to help sleep and legs , Disp: , Rfl: ;  cloNIDine (CATAPRES) 0.3 MG tablet, Take 0.3 mg by mouth 2 (two) times daily.  , Disp: , Rfl:  fish oil-omega-3 fatty acids 1000 MG capsule, Take 2 g by mouth 2 (two) times daily.  , Disp: , Rfl: ;  furosemide (LASIX) 20 MG tablet, Take 20 mg by mouth every morning.  , Disp: , Rfl: ;  glucose blood (ONE TOUCH ULTRA TEST) test strip, Use to test bs three times a day dx 250.00, Disp: 100 each, Rfl: 3 insulin lispro (HUMALOG KWIKPEN) 100 UNIT/ML injection, Inject into the skin. three times a day just before each meal:  80 units with evening meal, and 60 units with the other 2 meals., Disp: , Rfl: ;  Insulin Pen Needle (EASY TOUCH PEN NEEDLES) 31G X 6 MM MISC, Any brand, use three times a day , Disp: , Rfl: ;  Multiple Vitamin (MULTIVITAMIN) tablet, Take 1 tablet by mouth daily.  , Disp: , Rfl:    potassium chloride (KLOR-CON) 20 MEQ packet, Take 20 mEq by mouth daily.  , Disp: , Rfl: ;  pravastatin (PRAVACHOL) 80 MG tablet, Take 80 mg by mouth daily.  , Disp: , Rfl: ;  vardenafil (LEVITRA) 20 MG tablet, Take 20 mg by mouth as needed.  , Disp: , Rfl:   Past Medical History  Diagnosis Date  . Hypertension   . Coronary artery disease 2006    Non obstructive on cath 2006  . COPD (chronic obstructive pulmonary disease)     ON On PFT's and CT cgest. No desaturation on walking 11/2007. Started on  Spiriva  . Allergic rhinitis   . Hx of colonic polyps   . Hyperlipidemia   . Pulmonary nodule     6mm, stable Dec 2005 through Dec 2006 and May 2009, no further follow-up  . Osteoarthritis   . BPH (benign prostatic hyperplasia)   . Pneumonia ~2001    out patient    Past Surgical History  Procedure Date  . Spine surgery   . Cervical dis repair 1997  . Coronary angioplasty ~1954    ? VSD  . Coronary angioplasty 01/06    Negative  . Cardiovascular stress test 2003?  Marland Kitchen Pneumonia 2001    out pt    Family History  Problem Relation Age of Onset  .  Cancer Father     Prostate  . Aneurysm Father     AAA  . Heart disease Neg Hx     Negative FH for CAD  . Hypertension Neg Hx   . Diabetes Neg Hx     History   Social History  . Marital Status: Married    Spouse Name: N/A    Number of Children: 1  . Years of Education: N/A   Occupational History  . 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 Smokeless tobacco: Not on file  . Alcohol Use: No  . Drug Use: Not on file  . Sexually Active: Not on file   Other Topics Concern  . Not on file   Social History Narrative   No asbestos exposure, no silica exposure.Worked at [employer] and was exposed to [employer].   Review of Systems Still struggles staying asleep--works nights and can initiate but then doesn't stay asleep for  long Clonazepam did help but he ran out Appetite is fine Weight is down a little     Objective:   Physical Exam  Constitutional: He appears well-developed and well-nourished. No distress.  Neck: Normal range of motion. Neck supple. No thyromegaly present.  Cardiovascular: Normal rate, regular rhythm, normal heart sounds and intact distal pulses.  Exam reveals no gallop.   No murmur heard. Pulmonary/Chest: Effort normal and breath sounds normal. No respiratory distress. He has no wheezes. He has no rales.  Musculoskeletal: Normal range of motion. He exhibits no edema and no tenderness.  Lymphadenopathy:    He has no cervical adenopathy.  Skin:       Irritated skin tag lower right neck at collar  Psychiatric: He has a normal mood and affect. His behavior is normal. Judgment and thought content normal.          Assessment & Plan:

## 2010-12-07 NOTE — Assessment & Plan Note (Signed)
Lab Results  Component Value Date   LDLCALC 83 05/14/2010   Good control Labs next time

## 2010-12-07 NOTE — Assessment & Plan Note (Signed)
BP Readings from Last 3 Encounters:  12/07/10 142/80  10/19/10 152/86  06/08/10 140/84   Reasonable control  no changes Labs next time

## 2010-12-07 NOTE — Assessment & Plan Note (Signed)
Ongoing mild dribbling Will treat with tamsulosin if he worsens

## 2010-12-21 ENCOUNTER — Other Ambulatory Visit: Payer: Self-pay | Admitting: Family Medicine

## 2010-12-21 ENCOUNTER — Other Ambulatory Visit: Payer: Self-pay | Admitting: Internal Medicine

## 2011-01-13 ENCOUNTER — Other Ambulatory Visit: Payer: Self-pay | Admitting: Internal Medicine

## 2011-01-18 ENCOUNTER — Encounter: Payer: Self-pay | Admitting: Gastroenterology

## 2011-01-18 ENCOUNTER — Ambulatory Visit: Payer: BLUE CROSS/BLUE SHIELD | Admitting: Endocrinology

## 2011-02-08 ENCOUNTER — Ambulatory Visit: Payer: Self-pay | Admitting: Endocrinology

## 2011-03-10 ENCOUNTER — Other Ambulatory Visit: Payer: Self-pay | Admitting: Endocrinology

## 2011-03-10 ENCOUNTER — Other Ambulatory Visit: Payer: Self-pay | Admitting: Internal Medicine

## 2011-03-15 ENCOUNTER — Encounter: Payer: Self-pay | Admitting: Endocrinology

## 2011-03-15 ENCOUNTER — Ambulatory Visit (INDEPENDENT_AMBULATORY_CARE_PROVIDER_SITE_OTHER): Payer: BC Managed Care – PPO | Admitting: Endocrinology

## 2011-03-15 ENCOUNTER — Other Ambulatory Visit (INDEPENDENT_AMBULATORY_CARE_PROVIDER_SITE_OTHER): Payer: BC Managed Care – PPO

## 2011-03-15 VITALS — BP 152/88 | HR 97 | Temp 97.8°F | Ht 75.0 in | Wt 315.0 lb

## 2011-03-15 DIAGNOSIS — E119 Type 2 diabetes mellitus without complications: Secondary | ICD-10-CM

## 2011-03-15 LAB — HEMOGLOBIN A1C: Hgb A1c MFr Bld: 7.8 % — ABNORMAL HIGH (ref 4.6–6.5)

## 2011-03-15 NOTE — Patient Instructions (Addendum)
check your blood sugar 3 times a day.  vary the time of day when you check, between before the 3 meals, and at bedtime.  also check if you have symptoms of your blood sugar being too high or too low.  please keep a record of the readings and bring it to your next appointment here.  please call us sooner if you are having low blood sugar episodes. blood tests are being ordered for you today.  please call [phone] to hear your test results. pending the test results, please continue humalog to 80 units just before the evening meal, and 60 with breakfast, and 25 unit with your meal at work.   Please schedule a follow-up appointment in 3 months. good diet and exercise habits significanly improve the control of your diabetes.  please let me know if you wish to be referred to a dietician.  high blood sugar is very risky to your health.  you should see an eye doctor every year. controlling your blood pressure and cholesterol drastically reduces the damage diabetes does to your body.  this also applies to quitting smoking.  please discuss these with your doctor.  you should take an aspirin every day, unless you have been advised by your doctor not to.

## 2011-03-15 NOTE — Progress Notes (Signed)
Subjective:    Patient ID: Dennis Vasquez, male    DOB: 29-Oct-1945, 65 y.o.   MRN: [id number]  HPI pt states he feels well in general.  Pt says he had to reduce his insulin dose at work to 25 units, due to hypoglycemia.  no cbg record, but states cbg's are well-controlled on this regimen.   Past Medical History  Diagnosis Date  . Hypertension   . Coronary artery disease 2006    Non obstructive on cath 2006  . COPD (chronic obstructive pulmonary disease)     ON On PFT's and CT cgest. No desaturation on walking 11/2007. Started on  Spiriva  . Allergic rhinitis   . Hx of colonic polyps   . Hyperlipidemia   . Pulmonary nodule     6mm, stable Dec 2005 through Dec 2006 and May 2009, no further follow-up  . Osteoarthritis   . BPH (benign prostatic hyperplasia)   . Pneumonia ~2001    out patient    Past Surgical History  Procedure Date  . Spine surgery   . Cervical dis repair 1997  . Coronary angioplasty ~1954    ? VSD  . Coronary angioplasty 01/06    Negative  . Cardiovascular stress test 2003?  Marland Kitchen Pneumonia 2001    out pt    History   Social History  . Marital Status: Married    Spouse Name: N/A    Number of Children: 1  . Years of Education: N/A   Occupational History  . 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 Smoking status: Former Smoker    Quit date: 07/06/2003  . Smokeless tobacco: Not on file  . Alcohol Use: No  . Drug Use: Not on file  . Sexually Active: Not on file   Other Topics Concern  . Not on file   Social History Narrative   No asbestos exposure, no silica exposure.Worked at [employer] and was exposed to [employer].    Current Outpatient Prescriptions on File Prior to Visit  Medication Sig Dispense Refill  . amLODipine-benazepril (LOTREL) 10-20 MG per capsule TAKE 1 CAPSULE DAILY  90 capsule  1  . aspirin 81 MG tablet Take 81 mg by mouth daily.        . clonazePAM (KLONOPIN)  0.5 MG tablet Take 0.5-1 mg by mouth Nightly.        . cloNIDine (CATAPRES) 0.3 MG tablet TAKE 1 TABLET TWICE A DAY  180 tablet  1  . fish oil-omega-3 fatty acids 1000 MG capsule Take 2 g by mouth 2 (two) times daily.        . furosemide (LASIX) 20 MG tablet TAKE 1 TABLET EVERY MORNING  90 tablet  1  . insulin lispro (HUMALOG KWIKPEN) 100 UNIT/ML injection Inject into the skin. three times a day just before each meal:  80 units with evening meal, and 60 units with breakfast, and 25 units with your meal at work.      . Insulin Pen Needle (EASY TOUCH PEN NEEDLES) 31G X 6 MM MISC Any brand, use three times a day       . LEVITRA 20 MG tablet TAKE AS NEEDED  30 tablet  2  . Multiple Vitamin (MULTIVITAMIN) tablet Take 1 tablet by mouth daily.        . ONE TOUCH ULTRA TEST test strip TEST BLOOD SUGAR THREE TIMES A DAY  100 each  1  . potassium chloride (KLOR-CON) 20 MEQ packet Take 20 mEq by mouth daily.        . pravastatin (PRAVACHOL) 80 MG tablet TAKE 1 TABLET AT BEDTIME  90 tablet  2    No Known Allergies  Family History  Problem Relation Age of Onset  . Cancer Father     Prostate  . Aneurysm Father     AAA  . Heart disease Neg Hx     Negative FH for CAD  . Hypertension Neg Hx   . Diabetes Neg Hx     BP 152/88  Pulse 97  Temp(Src) 97.8 F (36.6 C) (Oral)  Ht 6\' 3"  (1.905 m)  Wt 315 lb (142.883 kg)  BMI 39.37 kg/m2  SpO2 93%     Review of Systems Denies loc    Objective:   Physical Exam VITAL SIGNS:  See vs page GENERAL: no distress SKIN: Insulin injection sites at the upper arms are normal       Assessment & Plan:  Dm, apparently well-controlled, on this lower dosage.

## 2011-04-19 ENCOUNTER — Emergency Department (HOSPITAL_COMMUNITY): Payer: BC Managed Care – PPO

## 2011-04-19 ENCOUNTER — Emergency Department (HOSPITAL_COMMUNITY)
Admission: EM | Admit: 2011-04-19 | Discharge: 2011-04-20 | Disposition: A | Discharge status: Home / Self Care | Payer: BC Managed Care – PPO | Attending: Emergency Medicine | Admitting: Emergency Medicine

## 2011-04-19 DIAGNOSIS — M545 Low back pain, unspecified: Secondary | ICD-10-CM | POA: Insufficient documentation

## 2011-04-19 DIAGNOSIS — R5383 Other fatigue: Secondary | ICD-10-CM | POA: Insufficient documentation

## 2011-04-19 DIAGNOSIS — E119 Type 2 diabetes mellitus without complications: Secondary | ICD-10-CM | POA: Insufficient documentation

## 2011-04-19 DIAGNOSIS — I252 Old myocardial infarction: Secondary | ICD-10-CM | POA: Insufficient documentation

## 2011-04-19 DIAGNOSIS — R5381 Other malaise: Secondary | ICD-10-CM | POA: Insufficient documentation

## 2011-04-19 DIAGNOSIS — I10 Essential (primary) hypertension: Secondary | ICD-10-CM | POA: Insufficient documentation

## 2011-04-19 DIAGNOSIS — Z79899 Other long term (current) drug therapy: Secondary | ICD-10-CM | POA: Insufficient documentation

## 2011-04-19 DIAGNOSIS — Z794 Long term (current) use of insulin: Secondary | ICD-10-CM | POA: Insufficient documentation

## 2011-04-19 LAB — URINALYSIS, ROUTINE W REFLEX MICROSCOPIC
Bilirubin Urine: NEGATIVE
Glucose, UA: 100 mg/dL — AB
Hgb urine dipstick: NEGATIVE
Leukocytes, UA: NEGATIVE
Nitrite: NEGATIVE
Protein, ur: 30 mg/dL — AB
Specific Gravity, Urine: 1.023 (ref 1.005–1.030)
Urobilinogen, UA: 0.2 mg/dL (ref 0.0–1.0)
pH: 6.5 (ref 5.0–8.0)

## 2011-04-19 LAB — DIFFERENTIAL
Basophils Absolute: 0 10*3/uL (ref 0.0–0.1)
Basophils Relative: 0 % (ref 0–1)
Eosinophils Absolute: 0.1 10*3/uL (ref 0.0–0.7)
Eosinophils Relative: 1 % (ref 0–5)
Lymphocytes Relative: 10 % — ABNORMAL LOW (ref 12–46)
Lymphs Abs: 1.3 10*3/uL (ref 0.7–4.0)
Monocytes Absolute: 0.6 10*3/uL (ref 0.1–1.0)
Monocytes Relative: 4 % (ref 3–12)
Neutro Abs: 10.9 10*3/uL — ABNORMAL HIGH (ref 1.7–7.7)
Neutrophils Relative %: 85 % — ABNORMAL HIGH (ref 43–77)

## 2011-04-19 LAB — CBC
HCT: 45.1 % (ref 39.0–52.0)
Hemoglobin: 15.8 g/dL (ref 13.0–17.0)
MCH: 30.2 pg (ref 26.0–34.0)
MCHC: 35 g/dL (ref 30.0–36.0)
MCV: 86.1 fL (ref 78.0–100.0)
Platelets: 241 10*3/uL (ref 150–400)
RBC: 5.24 MIL/uL (ref 4.22–5.81)
RDW: 13.2 % (ref 11.5–15.5)
WBC: 12.8 10*3/uL — ABNORMAL HIGH (ref 4.0–10.5)

## 2011-04-19 LAB — URINE MICROSCOPIC-ADD ON

## 2011-04-19 LAB — POCT I-STAT TROPONIN I: Troponin i, poc: 0.01 ng/mL (ref 0.00–0.08)

## 2011-04-19 LAB — BASIC METABOLIC PANEL
BUN: 14 mg/dL (ref 6–23)
CO2: 28 mEq/L (ref 19–32)
Calcium: 9.5 mg/dL (ref 8.4–10.5)
Chloride: 96 mEq/L (ref 96–112)
Creatinine, Ser: 0.99 mg/dL (ref 0.50–1.35)
GFR calc Af Amer: >90 mL/min (ref >90)
GFR calc non Af Amer: 86 mL/min — ABNORMAL LOW (ref >90)
Glucose, Bld: 210 mg/dL — ABNORMAL HIGH (ref 70–99)
Potassium: 4.4 mEq/L (ref 3.5–5.1)
Sodium: 134 mEq/L — ABNORMAL LOW (ref 135–145)

## 2011-05-09 ENCOUNTER — Other Ambulatory Visit: Payer: Self-pay | Admitting: Endocrinology

## 2011-05-14 ENCOUNTER — Ambulatory Visit: Payer: BC Managed Care – PPO | Admitting: Cardiology

## 2011-06-07 ENCOUNTER — Encounter: Payer: Self-pay | Admitting: Internal Medicine

## 2011-06-07 ENCOUNTER — Ambulatory Visit (INDEPENDENT_AMBULATORY_CARE_PROVIDER_SITE_OTHER): Payer: BC Managed Care – PPO | Admitting: Internal Medicine

## 2011-06-07 VITALS — BP 164/86 | HR 92 | Temp 98.1°F | Ht 75.0 in | Wt 316.0 lb

## 2011-06-07 DIAGNOSIS — E119 Type 2 diabetes mellitus without complications: Secondary | ICD-10-CM

## 2011-06-07 DIAGNOSIS — Z2911 Encounter for prophylactic immunotherapy for respiratory syncytial virus (RSV): Secondary | ICD-10-CM

## 2011-06-07 DIAGNOSIS — E785 Hyperlipidemia, unspecified: Secondary | ICD-10-CM

## 2011-06-07 DIAGNOSIS — Z0000 Encounter for general adult medical examination without abnormal findings: Secondary | ICD-10-CM

## 2011-06-07 DIAGNOSIS — I10 Essential (primary) hypertension: Secondary | ICD-10-CM

## 2011-06-07 DIAGNOSIS — Z23 Encounter for immunization: Secondary | ICD-10-CM

## 2011-06-07 LAB — LIPID PANEL
HDL: 47.4 mg/dL (ref >39.00)
LDL Cholesterol: 86 mg/dL (ref 0–99)
Total CHOL/HDL Ratio: 4
VLDL: 33.4 mg/dL (ref 0.0–40.0)

## 2011-06-07 LAB — PSA: PSA: 0.62 ng/mL (ref 0.10–4.00)

## 2011-06-07 MED ORDER — CLONIDINE HCL 0.3 MG PO TABS: 0.3000 mg | ORAL_TABLET | Freq: Three times a day (TID) | ORAL | Status: DC

## 2011-06-07 MED ORDER — FUROSEMIDE 40 MG PO TABS: 40.0000 mg | ORAL_TABLET | Freq: Two times a day (BID) | ORAL | Status: DC

## 2011-06-07 NOTE — Assessment & Plan Note (Signed)
Stable but problematic health status Limited mostly by back pain, despite using oxycodone for pain Discussed pool or bike exercise Will administer zostavax PSA after discussion

## 2011-06-07 NOTE — Assessment & Plan Note (Signed)
Lab Results  Component Value Date   HGBA1C 7.8* 03/15/2011   If sugars stay up, he will check with Dr Everardo All

## 2011-06-07 NOTE — Assessment & Plan Note (Signed)
BP Readings from Last 3 Encounters:  06/07/11 164/86  03/15/11 152/88  12/07/10 142/80   Not adequately controlled Will increase clonidine to tid Double furosemide to 40mg  Recheck 3 months

## 2011-06-07 NOTE — Assessment & Plan Note (Signed)
Lab Results  Component Value Date   LDLCALC 83 05/14/2010   No problems with the pravastatin Due for labs

## 2011-06-07 NOTE — Progress Notes (Signed)
Subjective:    Patient ID: Dennis Vasquez, male    DOB: Sep 28, 1945, 65 y.o.   MRN: [id number]  HPI Had to go to ER about 4 weeks ago Had spell with hip pain---took pain meds Within 30-45 minutes, felt palpitations, breathing off. Sugar was okay Rescue brought to ER--sat for a long time and then felt better so now distinct diagnosis  Continues on pain meds for back and leg pain (ruptured disc and radiculopathy) Struggle to go to work but continues Legs are "feeble" Left knee gives out at times---Dr Farris Has feels it is arthritis mostly  Doesn't check blood pressure Diabetes control is worse lately---had to adjust insulin due to hypoglycemia Up to 200 fasting at times  Current Outpatient Prescriptions on File Prior to Visit  Medication Sig Dispense Refill  . amLODipine-benazepril (LOTREL) 10-20 MG per capsule TAKE 1 CAPSULE DAILY  90 capsule  1  . aspirin 81 MG tablet Take 81 mg by mouth daily.        . clonazePAM (KLONOPIN) 0.5 MG tablet Take 0.5-1 mg by mouth Nightly.        . cloNIDine (CATAPRES) 0.3 MG tablet TAKE 1 TABLET TWICE A DAY  180 tablet  1  . fish oil-omega-3 fatty acids 1000 MG capsule Take 2 g by mouth 2 (two) times daily.        . furosemide (LASIX) 20 MG tablet TAKE 1 TABLET EVERY MORNING  90 tablet  1  . insulin lispro (HUMALOG KWIKPEN) 100 UNIT/ML injection Inject into the skin. three times a day just before each meal:  80 units with evening meal, and 60 units with breakfast, and 25 units with your meal at work.      . Insulin Pen Needle (EASY TOUCH PEN NEEDLES) 31G X 6 MM MISC Any brand, use three times a day       . LEVITRA 20 MG tablet TAKE AS NEEDED  30 tablet  2  . Multiple Vitamin (MULTIVITAMIN) tablet Take 1 tablet by mouth daily.        . ONE TOUCH ULTRA TEST test strip TEST BLOOD SUGAR THREE TIMES A DAY  200 each  3  . potassium chloride (KLOR-CON) 20 MEQ packet Take 20 mEq by mouth daily.        . pravastatin (PRAVACHOL) 80 MG tablet TAKE 1 TABLET AT  BEDTIME  90 tablet  2    No Known Allergies  Past Medical History  Diagnosis Date  . Hypertension   . Coronary artery disease 2006    Non obstructive on cath 2006  . COPD (chronic obstructive pulmonary disease)     ON On PFT's and CT cgest. No desaturation on walking 11/2007. Started on  Spiriva  . Allergic rhinitis   . Hx of colonic polyps   . Hyperlipidemia   . Pulmonary nodule     6mm, stable Dec 2005 through Dec 2006 and May 2009, no further follow-up  . Osteoarthritis   . BPH (benign prostatic hyperplasia)   . Pneumonia ~2001    out patient    Past Surgical History  Procedure Date  . Spine surgery   . Cervical dis repair 1997  . Coronary angioplasty ~1954    ? VSD  . Coronary angioplasty 01/06    Negative  . Cardiovascular stress test 2003?  Marland Kitchen Pneumonia 2001    out pt    Family History  Problem Relation Age of Onset  . Cancer Father     Prostate  .  Aneurysm Father     AAA  . Heart disease Neg Hx     Negative FH for CAD  . Hypertension Neg Hx   . Diabetes Neg Hx     History   Social History  . Marital Status: Married    Spouse Name: N/A    Number of Children: 1  . Years of Education: N/A   Occupational History  . 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 Smoking status: Former Smoker    Quit date: 07/06/2003  . Smokeless tobacco: Never Used  . Alcohol Use: No  . Drug Use: Not on file  . Sexually Active: Not on file   Other Topics Concern  . Not on file   Social History Narrative   No asbestos exposure, no silica exposure.Worked at [employer] and was exposed to [employer].   Review of Systems  Constitutional: Negative for fatigue and unexpected weight change.       Wears seat belt  HENT: Negative for hearing loss, congestion, rhinorrhea, dental problem and tinnitus.        Alergies better since stopped smoking Overdue with dentist  Eyes: Negative for visual disturbance.         No unilateral vision loss or diplopia  Respiratory: Positive for shortness of breath. Negative for cough and chest tightness.        May have some worsening in DOE  Cardiovascular: Positive for palpitations and leg swelling. Negative for chest pain.  Gastrointestinal: Negative for nausea, constipation and blood in stool.       Occ nausea if he gets stressed No heartburn Does get bloating  Genitourinary: Positive for urgency.       Some dribbling Sporadic nocturia Ongoing ED---limited due to back more   Musculoskeletal: Positive for back pain, arthralgias and gait problem. Negative for joint swelling.       Bad leg pain  Skin: Negative for color change and rash.       No suspicious areas  Neurological: Negative for dizziness, syncope, weakness, light-headedness, numbness and headaches.  Hematological: Negative for adenopathy. Does not bruise/bleed easily.  Psychiatric/Behavioral: Negative for sleep disturbance and dysphoric mood. The patient is not nervous/anxious.        Objective:   Physical Exam  Constitutional: He is oriented to person, place, and time. He appears well-developed and well-nourished. No distress.  HENT:  Head: Normocephalic and atraumatic.  Right Ear: External ear normal.  Left Ear: External ear normal.  Mouth/Throat: Oropharynx is clear and moist. No oropharyngeal exudate.       TMs normal  Eyes: Conjunctivae and EOM are normal. Pupils are equal, round, and reactive to light.       Fundi benign  Neck: Normal range of motion. Neck supple. No thyromegaly present.  Cardiovascular: Normal rate, regular rhythm, normal heart sounds and intact distal pulses.  Exam reveals no gallop.   No murmur heard. Pulmonary/Chest: Effort normal and breath sounds normal. No respiratory distress. He has no wheezes. He has no rales.  Abdominal: Soft. There is no tenderness.  Musculoskeletal: He exhibits edema.       2+ tight edema in calves/feet  Lymphadenopathy:    He  has no cervical adenopathy.  Neurological: He is alert and oriented to person, place, and time.  Skin: No rash noted. No erythema.  Psychiatric: He has a normal mood and affect. His behavior is normal. Judgment  and thought content normal.          Assessment & Plan:

## 2011-06-09 ENCOUNTER — Other Ambulatory Visit: Payer: Self-pay | Admitting: Internal Medicine

## 2011-06-25 ENCOUNTER — Ambulatory Visit: Payer: BC Managed Care – PPO | Admitting: Cardiology

## 2011-07-08 ENCOUNTER — Other Ambulatory Visit: Payer: Self-pay | Admitting: Internal Medicine

## 2011-07-14 ENCOUNTER — Ambulatory Visit (INDEPENDENT_AMBULATORY_CARE_PROVIDER_SITE_OTHER): Payer: BC Managed Care – PPO | Admitting: Cardiology

## 2011-07-14 ENCOUNTER — Encounter: Payer: Self-pay | Admitting: Cardiology

## 2011-07-14 VITALS — BP 150/88 | HR 76 | Ht 75.0 in | Wt 317.0 lb

## 2011-07-14 DIAGNOSIS — I270 Primary pulmonary hypertension: Secondary | ICD-10-CM

## 2011-07-14 DIAGNOSIS — I2510 Atherosclerotic heart disease of native coronary artery without angina pectoris: Secondary | ICD-10-CM

## 2011-07-14 DIAGNOSIS — E785 Hyperlipidemia, unspecified: Secondary | ICD-10-CM

## 2011-07-14 DIAGNOSIS — E118 Type 2 diabetes mellitus with unspecified complications: Secondary | ICD-10-CM | POA: Insufficient documentation

## 2011-07-14 DIAGNOSIS — I10 Essential (primary) hypertension: Secondary | ICD-10-CM

## 2011-07-14 NOTE — Patient Instructions (Signed)
Your physician wants you to follow-up in: 12 months or sooner if needed.  You will receive a reminder letter in the mail two months in advance. If you don't receive a letter, please call our office to schedule the follow-up appointment.  Your physician has requested that you have a lexiscan myoview. For further information please visit [email or web address] Please follow instruction sheet, as given.

## 2011-07-14 NOTE — Assessment & Plan Note (Signed)
His increased dyspnea on exertion is most likely multifactorial as I discussed with he and his wife today. However, he could have progression of his coronary disease that was diagnosed in 2006. Will perform stress Myoview to rule out any obstructive disease.  Advised to not smoke anymore which he quit in 2006, triglyceride, increase his activity to improve his endurance.

## 2011-07-14 NOTE — Progress Notes (Signed)
HPI [name] comes in today for evaluation of increased dyspnea on exertion. This got progressively worse the last couple months.  He has episodes where he becomes short of breath, quivers all over, and has back pain. He went to the emergency room with these complaints on October 16 this past fall. Third day which was done with no specific finding. 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 He quit smoking 6 years ago. His CBC, troponin, electrolytes, urinalysis were all normal.  His known coronary disease by catheterization in January 2006. That time he had a 40% stenosis in the right coronary artery, a 70% in a small marginal branch of the right, and a 30% in the proximal LAD. Remarkably, he had normal pulmonary wedge and pulmonary pressures. Left ventricular function was normal with an EF of 60%.  Denies any orthopnea, PND or increased edema. His Lasix dose has been double however. He said no syncope or presyncope. He denies any true angina. His mobility is limited by a bad left knee.  Past Medical History  Diagnosis Date  . Hypertension   . Coronary artery disease 2006    Non obstructive on cath 2006  . COPD (chronic obstructive pulmonary disease)     ON On PFT's and CT cgest. No desaturation on walking 11/2007. Started on  Spiriva  . Allergic rhinitis   . Hx of colonic polyps   . Hyperlipidemia   . Pulmonary nodule     6mm, stable Dec 2005 through Dec 2006 and May 2009, no further follow-up  . Osteoarthritis   . BPH (benign prostatic hyperplasia)   . Pneumonia ~2001    out patient    Current Outpatient Prescriptions  Medication Sig Dispense Refill  . amLODipine-benazepril (LOTREL) 10-20 MG per capsule TAKE 1 CAPSULE DAILY  90 capsule  0  . aspirin 81 MG tablet Take 81 mg by mouth daily.        . clonazePAM (KLONOPIN) 0.5 MG tablet Take 0.5-1 mg by mouth Nightly.        . cloNIDine (CATAPRES) 0.3 MG tablet Take  1 tablet (0.3 mg total) by mouth 3 (three) times daily.  270 tablet  3  . fish oil-omega-3 fatty acids 1000 MG capsule Take 2 g by mouth 2 (two) times daily.        . furosemide (LASIX) 40 MG tablet Take 1 tablet (40 mg total) by mouth 2 (two) times daily.  90 tablet  3  . insulin lispro (HUMALOG KWIKPEN) 100 UNIT/ML injection Inject into the skin. three times a day just before each meal:  80 units with evening meal, and 60 units with breakfast, and 25 units with your meal at work.      . Insulin Pen Needle (EASY TOUCH PEN NEEDLES) 31G X 6 MM MISC Any brand, use three times a day       . LEVITRA 20 MG tablet TAKE AS NEEDED  30 tablet  2  . Multiple Vitamin (MULTIVITAMIN) tablet Take 1 tablet by mouth daily.        . ONE TOUCH ULTRA TEST test strip TEST BLOOD SUGAR THREE TIMES A DAY  200 each  3  . potassium chloride (KLOR-CON) 20 MEQ packet Take 20 mEq by mouth daily.        . potassium chloride SA (K-DUR,KLOR-CON) 20 MEQ tablet TAKE 1 TABLET DAILY  90 tablet  1  . pravastatin (PRAVACHOL) 80 MG tablet  TAKE 1 TABLET AT BEDTIME  90 tablet  2    No Known Allergies  Family History  Problem Relation Age of Onset  . Cancer Father     Prostate  . Aneurysm Father     AAA  . Heart disease Neg Hx     Negative FH for CAD  . Hypertension Neg Hx   . Diabetes Neg Hx     History   Social History  . Marital Status: Married    Spouse Name: N/A    Number of Children: 1  . Years of Education: N/A   Occupational History  . 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 Smoking status: Former Smoker    Quit date: 07/06/2003  . Smokeless tobacco: Never Used  . Alcohol Use: No  . Drug Use: Not on file  . Sexually Active: Not on file   Other Topics Concern  . Not on file   Social History Narrative   No asbestos exposure, no silica exposure.Worked at [employer] and was exposed to [employer].    ROS ALL NEGATIVE EXCEPT THOSE NOTED  IN HPI  PE  General Appearance: well developed, well nourished in no acute distress, morbidly obese HEENT: symmetrical face, PERRLA, good dentition  Neck: no JVD, thyromegaly, or adenopathy, trachea midline Chest: symmetric without deformity Cardiac: poorly appreciated because of large chest, RRR, normal S1, S2, no gallop or murmur Lung: clear to ausculation and percussion Vascular: all pulses full without bruits  Abdominal: nondistended, nontender, good bowel sounds, no HSM, no bruits Extremities: no cyanosis, clubbing , 1+ bilateral pitting edema is, no sign of DVT, no varicosities  Skin: normal color, no rashes Neuro: alert and oriented x 3, non-focal Pysch: normal affect  EKG EKG reviewed from the ER in October and was normal. We did not repeat today. BMET    Component Value Date/Time   NA 134* 04/19/2011 2258   K 4.4 04/19/2011 2258   CL 96 04/19/2011 2258   CO2 28 04/19/2011 2258   GLUCOSE 210* 04/19/2011 2258   BUN 14 04/19/2011 2258   CREATININE 0.99 04/19/2011 2258   CALCIUM 9.5 04/19/2011 2258   GFRNONAA 86* 04/19/2011 2258   GFRAA >90 04/19/2011 2258    Lipid Panel     Component Value Date/Time   CHOL 167 06/07/2011 1256   TRIG 167.0* 06/07/2011 1256   HDL 47.40 06/07/2011 1256   CHOLHDL 4 06/07/2011 1256   VLDL 33.4 06/07/2011 1256   LDLCALC 86 06/07/2011 1256    CBC    Component Value Date/Time   WBC 12.8* 04/19/2011 2258   RBC 5.24 04/19/2011 2258   HGB 15.8 04/19/2011 2258   HCT 45.1 04/19/2011 2258   PLT 241 04/19/2011 2258   MCV 86.1 04/19/2011 2258   MCH 30.2 04/19/2011 2258   MCHC 35.0 04/19/2011 2258   RDW 13.2 04/19/2011 2258   LYMPHSABS 1.3 04/19/2011 2258   MONOABS 0.6 04/19/2011 2258   EOSABS 0.1 04/19/2011 2258   BASOSABS 0.0 04/19/2011 2258

## 2011-07-19 ENCOUNTER — Ambulatory Visit: Payer: BC Managed Care – PPO | Admitting: Endocrinology

## 2011-07-19 DIAGNOSIS — Z0289 Encounter for other administrative examinations: Secondary | ICD-10-CM

## 2011-07-20 ENCOUNTER — Other Ambulatory Visit (HOSPITAL_COMMUNITY): Payer: Self-pay | Admitting: Cardiology

## 2011-07-20 DIAGNOSIS — R0602 Shortness of breath: Secondary | ICD-10-CM

## 2011-07-21 ENCOUNTER — Ambulatory Visit (HOSPITAL_COMMUNITY): Payer: BC Managed Care – PPO | Attending: Cardiovascular Disease | Admitting: Radiology

## 2011-07-21 VITALS — BP 129/82 | Ht 75.0 in | Wt 312.0 lb

## 2011-07-21 DIAGNOSIS — I10 Essential (primary) hypertension: Secondary | ICD-10-CM | POA: Insufficient documentation

## 2011-07-21 DIAGNOSIS — J4489 Other specified chronic obstructive pulmonary disease: Secondary | ICD-10-CM | POA: Insufficient documentation

## 2011-07-21 DIAGNOSIS — R079 Chest pain, unspecified: Secondary | ICD-10-CM | POA: Insufficient documentation

## 2011-07-21 DIAGNOSIS — Z87891 Personal history of nicotine dependence: Secondary | ICD-10-CM | POA: Insufficient documentation

## 2011-07-21 DIAGNOSIS — R0989 Other specified symptoms and signs involving the circulatory and respiratory systems: Secondary | ICD-10-CM | POA: Insufficient documentation

## 2011-07-21 DIAGNOSIS — I2510 Atherosclerotic heart disease of native coronary artery without angina pectoris: Secondary | ICD-10-CM | POA: Insufficient documentation

## 2011-07-21 DIAGNOSIS — J449 Chronic obstructive pulmonary disease, unspecified: Secondary | ICD-10-CM | POA: Insufficient documentation

## 2011-07-21 DIAGNOSIS — E119 Type 2 diabetes mellitus without complications: Secondary | ICD-10-CM | POA: Insufficient documentation

## 2011-07-21 DIAGNOSIS — E785 Hyperlipidemia, unspecified: Secondary | ICD-10-CM | POA: Insufficient documentation

## 2011-07-21 DIAGNOSIS — R0602 Shortness of breath: Secondary | ICD-10-CM | POA: Insufficient documentation

## 2011-07-21 DIAGNOSIS — Z794 Long term (current) use of insulin: Secondary | ICD-10-CM | POA: Insufficient documentation

## 2011-07-21 DIAGNOSIS — I2789 Other specified pulmonary heart diseases: Secondary | ICD-10-CM | POA: Insufficient documentation

## 2011-07-21 DIAGNOSIS — R0609 Other forms of dyspnea: Secondary | ICD-10-CM | POA: Insufficient documentation

## 2011-07-21 MED ORDER — REGADENOSON 0.4 MG/5ML IV SOLN
0.4000 mg | Freq: Once | INTRAVENOUS | Status: AC
  Administered 2011-07-21: 0.4 mg via INTRAVENOUS

## 2011-07-21 MED ORDER — TECHNETIUM TC 99M TETROFOSMIN IV KIT
33.0000 | PACK | Freq: Once | INTRAVENOUS | Status: AC | PRN
  Administered 2011-07-21: 33 via INTRAVENOUS

## 2011-07-21 NOTE — Progress Notes (Signed)
[facility] 3 NUCLEAR MED [address] [place] [address] [phone]  Cardiology Nuclear Med Study  Dennis Vasquez is a 66 y.o. male [id number] 11-09-1945   Nuclear Med Background Indication for Stress Test:  Evaluation for Ischemia History:  COPD,2006 Echo: NL, 2006 Heart Catheterization: N/O DZ EF: 60% mild pulmonary htn and 2009 Myocardial Perfusion Study: Inf. Wall thinning diaph. Attenuation (-) ischemia EF: 60%  Cardiac Risk Factors: History of Smoking, Hypertension, IDDM Type 2 and Lipids  Symptoms:  Chest Pain, DOE and SOB   Nuclear Pre-Procedure Caffeine/Decaff Intake:  None NPO After: 3:30am   Lungs:  clear IV 0.9% NS with Angio Cath:  20g  IV Site: R Antecubital  IV Started by:  [name], EMT-P  Chest Size (in):  58 Cup Size: n/a  Height: 6\' 3"  (1.905 m)  Weight:  312 lb (141.522 kg)  BMI:  Body mass index is 39.00 kg/(m^2). Tech Comments:  CBG = 177 @ 7am, per patient.    Nuclear Med Study 1 or 2 day study: 2 day  Stress Test Type:  Lexiscan  Reading MD: B.Zyionna Pesce,M.D. Order Authorizing Provider:  Juanito Doom  Resting Radionuclide: Technetium 65m Tetrofosmin  Resting Radionuclide Dose: 33.0 mCi on 07/26/11  Stress Radionuclide:  Technetium 73m Tetrofosmin  Stress Radionuclide Dose: 33.0 mCi  On 07/21/11          Stress Protocol Rest HR: 71 Stress HR: 96  Rest BP: 129/82 Stress BP: 155/86  Exercise Time (min): n/a METS: n/a   Predicted Max HR: 157 bpm % Max HR: 61.15 bpm Rate Pressure Product: 21308   Dose of Adenosine (mg):  n/a Dose of Lexiscan: 0.4 mg  Dose of Atropine (mg): n/a Dose of Dobutamine: n/a mcg/kg/min (at max HR)  Stress Test Technologist: Milana Na, EMT-P  Nuclear Technologist:  Domenic Polite, CNMT     Rest Procedure:  Myocardial perfusion imaging was performed at rest 45 minutes following the intravenous administration of Technetium 68m Tetrofosmin. Rest ECG: NSR  Stress Procedure:  The patient  received IV Lexiscan 0.4 mg over 15-seconds.  Technetium 30m Tetrofosmin injected at 30-seconds.  There were no significant changes, + sob, and feels funny with Lexiscan.  Quantitative spect images were obtained after a 45 minute delay. Stress ECG: No significant ST segment change suggestive of ischemia.  QPS Raw Data Images:  Acquisition technically good; normal left ventricular size. Stress Images:  There is decreased uptake in the inferior wall and apex. Rest Images:  There is decreased uptake in the inferior wall and apex; inferior defect less prominent compared to the stress images. Subtraction (SDS):  These findings are consistent with inferior and apical thinning and mild inferior ischemia. Transient Ischemic Dilatation (Normal <1.22):  1.01 Lung/Heart Ratio (Normal <0.45):  0.36  Quantitative Gated Spect Images QGS EDV:  128 ml QGS ESV:  50 ml QGS cine images:  NL LV Function; NL Wall Motion QGS EF: 61%  Impression Exercise Capacity:  Lexiscan with no exercise. BP Response:  Normal blood pressure response. Clinical Symptoms:  No chest pain. ECG Impression:  No significant ST segment change suggestive of ischemia. Comparison with Prior Nuclear Study: Mild inferior ischemia new compared to previous  Overall Impression:  Abnormal stress nuclear study with a small, partially reversible inferior and apical defect consistent with inferior and apical thinning and mild inferior ischemia.   Olga Millers

## 2011-07-26 ENCOUNTER — Ambulatory Visit (HOSPITAL_COMMUNITY): Payer: BC Managed Care – PPO | Attending: Cardiovascular Disease | Admitting: Radiology

## 2011-07-26 DIAGNOSIS — R0989 Other specified symptoms and signs involving the circulatory and respiratory systems: Secondary | ICD-10-CM

## 2011-07-26 MED ORDER — TECHNETIUM TC 99M TETROFOSMIN IV KIT
33.0000 | PACK | Freq: Once | INTRAVENOUS | Status: AC | PRN
  Administered 2011-07-26: 33 via INTRAVENOUS

## 2011-08-02 ENCOUNTER — Other Ambulatory Visit: Payer: Self-pay | Admitting: Sports Medicine

## 2011-08-02 DIAGNOSIS — M545 Low back pain: Secondary | ICD-10-CM

## 2011-08-04 ENCOUNTER — Telehealth: Payer: Self-pay | Admitting: Cardiology

## 2011-08-04 DIAGNOSIS — I2510 Atherosclerotic heart disease of native coronary artery without angina pectoris: Secondary | ICD-10-CM

## 2011-08-04 NOTE — Telephone Encounter (Signed)
New msg: pt wife calling wanting to find out results of pt stress test and lexi only. Please return pt wife call to discuss further.

## 2011-08-04 NOTE — Telephone Encounter (Signed)
lmtcb myoview results. Mylo Red RN

## 2011-08-05 ENCOUNTER — Ambulatory Visit
Admission: RE | Admit: 2011-08-05 | Discharge: 2011-08-05 | Disposition: A | Discharge status: Home / Self Care | Payer: BC Managed Care – PPO | Source: Ambulatory Visit | Attending: Sports Medicine | Admitting: Sports Medicine

## 2011-08-05 ENCOUNTER — Encounter: Payer: Self-pay | Admitting: *Deleted

## 2011-08-05 DIAGNOSIS — M545 Low back pain: Secondary | ICD-10-CM

## 2011-08-05 NOTE — Telephone Encounter (Signed)
Wife returns call today re: myoview results.  Per Dr. Daleen Squibb would like to set pt up for op cath reevaluate progression of CAD. Wife is agreeable.  She would also like for Dr. Daleen Squibb to know pt has been having "quite a bit of back and leg pain for the past 2 weeks"  He will be having an MRI of his back this evening then follow-up with Dr. Farris Has on 2/4 to review.  Would like cath scheduled for next week.  Cath scheduled Friday 08/13/11 with Dr. Excell Seltzer in the [facility] Mylo Red RN

## 2011-08-05 NOTE — Telephone Encounter (Signed)
Left detailed message on personal voicemail. Re: cath scheduled for 2/8 and lab work at the office on Monday. Will give pt letter when comes in for lab work on Monday Mylo Red RN

## 2011-08-06 HISTORY — PX: BACK SURGERY: SHX140

## 2011-08-06 HISTORY — PX: CARDIAC CATHETERIZATION: SHX172

## 2011-08-09 ENCOUNTER — Other Ambulatory Visit (INDEPENDENT_AMBULATORY_CARE_PROVIDER_SITE_OTHER): Payer: BC Managed Care – PPO | Admitting: *Deleted

## 2011-08-09 ENCOUNTER — Ambulatory Visit: Payer: BC Managed Care – PPO | Admitting: Internal Medicine

## 2011-08-09 DIAGNOSIS — I2510 Atherosclerotic heart disease of native coronary artery without angina pectoris: Secondary | ICD-10-CM

## 2011-08-09 LAB — BASIC METABOLIC PANEL
BUN: 18 mg/dL (ref 6–23)
Calcium: 9.4 mg/dL (ref 8.4–10.5)
Creatinine, Ser: 1.1 mg/dL (ref 0.4–1.5)
GFR: 69.59 mL/min (ref >60.00)
Glucose, Bld: 223 mg/dL — ABNORMAL HIGH (ref 70–99)

## 2011-08-09 LAB — CBC WITH DIFFERENTIAL/PLATELET
Basophils Absolute: 0 10*3/uL (ref 0.0–0.1)
HCT: 44.4 % (ref 39.0–52.0)
Lymphs Abs: 1.2 10*3/uL (ref 0.7–4.0)
Monocytes Relative: 4.4 % (ref 3.0–12.0)
Neutrophils Relative %: 85.1 % — ABNORMAL HIGH (ref 43.0–77.0)
Platelets: 312 10*3/uL (ref 150.0–400.0)
RDW: 13.3 % (ref 11.5–14.6)

## 2011-08-09 NOTE — Telephone Encounter (Signed)
Notified pt that he does not need to be fasting for labs today.

## 2011-08-09 NOTE — Telephone Encounter (Signed)
Follow up from previous:  Patient wife calling. Lab work today non fasting, need clarification

## 2011-08-12 ENCOUNTER — Other Ambulatory Visit: Payer: Self-pay | Admitting: Cardiovascular Disease

## 2011-08-13 ENCOUNTER — Inpatient Hospital Stay (HOSPITAL_BASED_OUTPATIENT_CLINIC_OR_DEPARTMENT_OTHER)
Admission: RE | Admit: 2011-08-13 | Discharge: 2011-08-13 | Disposition: A | Discharge status: Home / Self Care | Payer: BC Managed Care – PPO | Source: Ambulatory Visit | Attending: Cardiovascular Disease | Admitting: Cardiovascular Disease

## 2011-08-13 ENCOUNTER — Encounter (HOSPITAL_BASED_OUTPATIENT_CLINIC_OR_DEPARTMENT_OTHER)
Admission: RE | Disposition: A | Discharge status: Home / Self Care | Payer: Self-pay | Source: Ambulatory Visit | Attending: Cardiovascular Disease

## 2011-08-13 DIAGNOSIS — J4489 Other specified chronic obstructive pulmonary disease: Secondary | ICD-10-CM | POA: Insufficient documentation

## 2011-08-13 DIAGNOSIS — I2510 Atherosclerotic heart disease of native coronary artery without angina pectoris: Secondary | ICD-10-CM

## 2011-08-13 DIAGNOSIS — J449 Chronic obstructive pulmonary disease, unspecified: Secondary | ICD-10-CM | POA: Insufficient documentation

## 2011-08-13 DIAGNOSIS — I10 Essential (primary) hypertension: Secondary | ICD-10-CM | POA: Insufficient documentation

## 2011-08-13 DIAGNOSIS — R0602 Shortness of breath: Secondary | ICD-10-CM | POA: Insufficient documentation

## 2011-08-13 LAB — POCT I-STAT 3, VENOUS BLOOD GAS (G3P V)
Acid-Base Excess: 3 mmol/L — ABNORMAL HIGH (ref 0.0–2.0)
Acid-Base Excess: 3 mmol/L — ABNORMAL HIGH (ref 0.0–2.0)
Bicarbonate: 29.1 mEq/L — ABNORMAL HIGH (ref 20.0–24.0)
O2 Saturation: 66 %
O2 Saturation: 70 %
TCO2: 31 mmol/L (ref 0–100)
pO2, Ven: 36 mmHg (ref 30.0–45.0)

## 2011-08-13 LAB — POCT I-STAT 3, ART BLOOD GAS (G3+): Acid-Base Excess: 2 mmol/L (ref 0.0–2.0)

## 2011-08-13 SURGERY — JV LEFT HEART CATHETERIZATION WITH CORONARY ANGIOGRAM
Anesthesia: Moderate Sedation

## 2011-08-13 MED ORDER — ACETAMINOPHEN 325 MG PO TABS: 650.0000 mg | ORAL_TABLET | ORAL | Status: DC | PRN

## 2011-08-13 MED ORDER — DIAZEPAM 5 MG PO TABS
5.0000 mg | ORAL_TABLET | ORAL | Status: AC
Start: 2011-08-13 — End: 2011-08-13
  Administered 2011-08-13: 5 mg via ORAL

## 2011-08-13 MED ORDER — SODIUM CHLORIDE 0.9 % IV SOLN
INTRAVENOUS | Status: DC
  Administered 2011-08-13: 08:00:00 via INTRAVENOUS

## 2011-08-13 MED ORDER — ONDANSETRON HCL 4 MG/2ML IJ SOLN: 4.0000 mg | Freq: Four times a day (QID) | INTRAMUSCULAR | Status: DC | PRN

## 2011-08-13 MED ORDER — SODIUM CHLORIDE 0.9 % IV SOLN: 1.0000 mL/kg/h | INTRAVENOUS | Status: DC

## 2011-08-13 MED ORDER — SODIUM CHLORIDE 0.9 % IV SOLN: 250.0000 mL | INTRAVENOUS | Status: DC | PRN

## 2011-08-13 MED ORDER — ASPIRIN 81 MG PO CHEW
324.0000 mg | CHEWABLE_TABLET | ORAL | Status: AC
  Administered 2011-08-13: 324 mg via ORAL

## 2011-08-13 MED ORDER — OXYCODONE-ACETAMINOPHEN 5-325 MG PO TABS
2.0000 | ORAL_TABLET | ORAL | Status: DC | PRN
  Administered 2011-08-13: 2 via ORAL

## 2011-08-13 MED ORDER — SODIUM CHLORIDE 0.9 % IJ SOLN: 3.0000 mL | INTRAMUSCULAR | Status: DC | PRN

## 2011-08-13 MED ORDER — SODIUM CHLORIDE 0.9 % IJ SOLN: 3.0000 mL | Freq: Two times a day (BID) | INTRAMUSCULAR | Status: DC

## 2011-08-13 NOTE — Progress Notes (Signed)
Bedrest begins @ 1000, Dr. Excell Seltzer in to discuss results with patient and wife.

## 2011-08-13 NOTE — Progress Notes (Signed)
Discharge instructions completed, ambulated to bathroom without bleeding from right groin site, discharged to home via wheelchair with wife. 

## 2011-08-13 NOTE — H&P (View-Only) (Signed)
HPI Dennis Vasquez comes in today for evaluation of increased dyspnea on exertion. This got progressively worse the last couple months.  He has episodes where he becomes short of breath, quivers all over, and has back pain. He went to the emergency room with these complaints on October 16 this past fall. Third day which was done with no specific finding. 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 He quit smoking 6 years ago. His CBC, troponin, electrolytes, urinalysis were all normal.  His known coronary disease by catheterization in January 2006. That time he had a 40% stenosis in the right coronary artery, a 70% in a small marginal branch of the right, and a 30% in the proximal LAD. Remarkably, he had normal pulmonary wedge and pulmonary pressures. Left ventricular function was normal with an EF of 60%.  Denies any orthopnea, PND or increased edema. His Lasix dose has been double however. He said no syncope or presyncope. He denies any true angina. His mobility is limited by a bad left knee.  Past Medical History  Diagnosis Date  . Hypertension   . Coronary artery disease 2006    Non obstructive on cath 2006  . COPD (chronic obstructive pulmonary disease)     ON On PFT's and CT cgest. No desaturation on walking 11/2007. Started on  Spiriva  . Allergic rhinitis   . Hx of colonic polyps   . Hyperlipidemia   . Pulmonary nodule     6mm, stable Dec 2005 through Dec 2006 and May 2009, no further follow-up  . Osteoarthritis   . BPH (benign prostatic hyperplasia)   . Pneumonia ~2001    out patient    Current Outpatient Prescriptions  Medication Sig Dispense Refill  . amLODipine-benazepril (LOTREL) 10-20 MG per capsule TAKE 1 CAPSULE DAILY  90 capsule  0  . aspirin 81 MG tablet Take 81 mg by mouth daily.        . clonazePAM (KLONOPIN) 0.5 MG tablet Take 0.5-1 mg by mouth Nightly.        . cloNIDine (CATAPRES) 0.3 MG tablet Take  1 tablet (0.3 mg total) by mouth 3 (three) times daily.  270 tablet  3  . fish oil-omega-3 fatty acids 1000 MG capsule Take 2 g by mouth 2 (two) times daily.        . furosemide (LASIX) 40 MG tablet Take 1 tablet (40 mg total) by mouth 2 (two) times daily.  90 tablet  3  . insulin lispro (HUMALOG KWIKPEN) 100 UNIT/ML injection Inject into the skin. three times a day just before each meal:  80 units with evening meal, and 60 units with breakfast, and 25 units with your meal at work.      . Insulin Pen Needle (EASY TOUCH PEN NEEDLES) 31G X 6 MM MISC Any brand, use three times a day       . LEVITRA 20 MG tablet TAKE AS NEEDED  30 tablet  2  . Multiple Vitamin (MULTIVITAMIN) tablet Take 1 tablet by mouth daily.        . ONE TOUCH ULTRA TEST test strip TEST BLOOD SUGAR THREE TIMES A DAY  200 each  3  . potassium chloride (KLOR-CON) 20 MEQ packet Take 20 mEq by mouth daily.        . potassium chloride SA (K-DUR,KLOR-CON) 20 MEQ tablet TAKE 1 TABLET DAILY  90 tablet  1  . pravastatin (PRAVACHOL) 80 MG tablet   TAKE 1 TABLET AT BEDTIME  90 tablet  2    No Known Allergies  Family History  Problem Relation Age of Onset  . Cancer Father     Prostate  . Aneurysm Father     AAA  . Heart disease Neg Hx     Negative FH for CAD  . Hypertension Neg Hx   . Diabetes Neg Hx     History   Social History  . Marital Status: Married    Spouse Name: N/A    Number of Children: 1  . Years of Education: N/A   Occupational History  . 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 Smoking status: Former Smoker    Quit date: 07/06/2003  . Smokeless tobacco: Never Used  . Alcohol Use: No  . Drug Use: Not on file  . Sexually Active: Not on file   Other Topics Concern  . Not on file   Social History Narrative   No asbestos exposure, no silica exposure.Worked at [employer] and was exposed to cotton.    ROS ALL NEGATIVE EXCEPT THOSE NOTED  IN HPI  PE  General Appearance: well developed, well nourished in no acute distress, morbidly obese HEENT: symmetrical face, PERRLA, good dentition  Neck: no JVD, thyromegaly, or adenopathy, trachea midline Chest: symmetric without deformity Cardiac: poorly appreciated because of large chest, RRR, normal S1, S2, no gallop or murmur Lung: clear to ausculation and percussion Vascular: all pulses full without bruits  Abdominal: nondistended, nontender, good bowel sounds, no HSM, no bruits Extremities: no cyanosis, clubbing , 1+ bilateral pitting edema is, no sign of DVT, no varicosities  Skin: normal color, no rashes Neuro: alert and oriented x 3, non-focal Pysch: normal affect  EKG EKG reviewed from the ER in October and was normal. We did not repeat today. BMET    Component Value Date/Time   NA 134* 04/19/2011 2258   K 4.4 04/19/2011 2258   CL 96 04/19/2011 2258   CO2 28 04/19/2011 2258   GLUCOSE 210* 04/19/2011 2258   BUN 14 04/19/2011 2258   CREATININE 0.99 04/19/2011 2258   CALCIUM 9.5 04/19/2011 2258   GFRNONAA 86* 04/19/2011 2258   GFRAA >90 04/19/2011 2258    Lipid Panel     Component Value Date/Time   CHOL 167 06/07/2011 1256   TRIG 167.0* 06/07/2011 1256   HDL 47.40 06/07/2011 1256   CHOLHDL 4 06/07/2011 1256   VLDL 33.4 06/07/2011 1256   LDLCALC 86 06/07/2011 1256    CBC    Component Value Date/Time   WBC 12.8* 04/19/2011 2258   RBC 5.24 04/19/2011 2258   HGB 15.8 04/19/2011 2258   HCT 45.1 04/19/2011 2258   PLT 241 04/19/2011 2258   MCV 86.1 04/19/2011 2258   MCH 30.2 04/19/2011 2258   MCHC 35.0 04/19/2011 2258   RDW 13.2 04/19/2011 2258   LYMPHSABS 1.3 04/19/2011 2258   MONOABS 0.6 04/19/2011 2258   EOSABS 0.1 04/19/2011 2258   BASOSABS 0.0 04/19/2011 2258      

## 2011-08-13 NOTE — Op Note (Signed)
Cardiac Catheterization Procedure Note  Name: Dennis Vasquez MRN: [id number] DOB: 06-19-1946  Procedure: Right Heart Cath, Left Heart Cath, Selective Coronary Angiography, LV angiography  Indication: This is a 66 year old gentleman with progressive shortness of breath. He had a stress Myoview scan that showed inferior ischemia. He was referred for cardiac cath. Because of his prominent complaint of dyspnea, I elected to do both a right and left heart catheterization.   Procedural Details: The right groin was prepped, draped, and anesthetized with 1% lidocaine. Using the modified Seldinger technique a 4 French sheath was placed in the right femoral artery and a 6 French sheath was placed in the right femoral vein. A multipurpose catheter was used for the right heart catheterization. Standard protocol was followed for recording of right heart pressures and sampling of oxygen saturations. Fick cardiac output was calculated. Standard Judkins catheters were used for selective coronary angiography and left ventriculography. There were no immediate procedural complications. The patient was transferred to the post catheterization recovery area for further monitoring.  Procedural Findings: Hemodynamics RA 8 RV 33/10 PA 32/13 with a mean of 22 PCWP 17 LV 141/19 AO 143/86 with a mean of 112  Oxygen saturations: PA 70 AO 92 SVC 66  Cardiac Output (Fick) 7.4  Cardiac Index (Fick) 2.8   Coronary angiography: Coronary dominance: right  Left mainstem: Widely patent with no obstructive disease  Left anterior descending (LAD): The LAD is diffusely calcified. There is mild proximal vessel stenosis in the range of 40-50%. There is associated calcification. The LAD wraps around the left ventricular apex. There is no significant distal vessel stenosis.  Left circumflex (LCx): The left circumflex is widely patent throughout. The proximal vessel is tortuous. There are 2 large obtuse marginal branches  with no stenosis.  Right coronary artery (RCA): The RCA is dominant. The vessel has a 50% mid stenosis. The mid and distal vessel tapers and is mildly disease. The distal vessel has another 50% stenosis before the bifurcation of the PDA and posterolateral segments. The PDA branch is small in caliber.  Left ventriculography: Left ventricular systolic function is normal, LVEF is estimated at 55-65%, there is no significant mitral regurgitation   Final Conclusions:   1. Mild diffuse coronary artery disease 2. Normal LV function 3. Essentially normal intracardiac hemodynamics  Tonny Bollman 08/13/2011, 9:44 AM

## 2011-08-13 NOTE — Interval H&P Note (Signed)
History and Physical Interval Note:  08/13/2011 9:13 AM  Dennis Vasquez  has presented today for surgery, with the diagnosis of chest pain  The various methods of treatment have been discussed with the patient and family. After consideration of risks, benefits and other options for treatment, the patient has consented to  Procedure(s): JV LEFT HEART CATHETERIZATION WITH CORONARY ANGIOGRAM as a surgical intervention .  The patients' history has been reviewed, patient examined, no change in status, stable for surgery.  I have reviewed the patients' chart and labs.  Questions were answered to the patient's satisfaction.     Tonny Bollman  Abnormal Myoview noted with inferior ischemia. Main symptom is dyspnea. Plan right and left heart cath.  Tonny Bollman 08/13/2011 9:13 AM

## 2011-08-17 ENCOUNTER — Telehealth: Payer: Self-pay | Admitting: Physician Assistant

## 2011-08-17 NOTE — Telephone Encounter (Signed)
Dennis Vasquez wife Dennis Vasquez called the answering service re: husband's cath site bruising. I called her back. She states that her husband underwent cardiac catheterization on 08/13/11. Immediately after, Dennis Vasquez developed a small localized mass and erythema which was stable. He was discharged. Over the weekend he developed some more bruising. The knot has not increased in size, there is no bleeding, discharge, increased pain, calor to the site, fevers or chills. I stated that bruising can be common after a cardiac catheterization and to monitor the site for an increase in the size of the "knot," increased pain, bleeding, discharge, swelling, fevers, chills or heat from the site. She was advised to contact the office if any of these qualities were apparent. She expressed understanding and agreed.    Jacqulyn Bath, PA-C 08/17/2011 6:48 PM

## 2011-08-27 ENCOUNTER — Encounter: Payer: BC Managed Care – PPO | Admitting: Physician Assistant

## 2011-08-30 ENCOUNTER — Ambulatory Visit: Payer: BC Managed Care – PPO | Admitting: Internal Medicine

## 2011-09-01 ENCOUNTER — Encounter: Payer: BC Managed Care – PPO | Admitting: Physician Assistant

## 2011-09-03 ENCOUNTER — Other Ambulatory Visit: Payer: Self-pay | Admitting: Endocrinology

## 2011-09-07 ENCOUNTER — Other Ambulatory Visit: Payer: Self-pay | Admitting: Internal Medicine

## 2011-09-10 ENCOUNTER — Encounter: Payer: BC Managed Care – PPO | Admitting: Physician Assistant

## 2011-09-17 ENCOUNTER — Encounter: Payer: BC Managed Care – PPO | Admitting: Physician Assistant

## 2011-09-23 ENCOUNTER — Encounter: Payer: BC Managed Care – PPO | Admitting: Physician Assistant

## 2011-09-27 ENCOUNTER — Ambulatory Visit: Payer: BC Managed Care – PPO | Admitting: Internal Medicine

## 2011-09-28 ENCOUNTER — Encounter: Payer: Self-pay | Admitting: Physician Assistant

## 2011-09-28 ENCOUNTER — Ambulatory Visit (INDEPENDENT_AMBULATORY_CARE_PROVIDER_SITE_OTHER): Payer: BC Managed Care – PPO | Admitting: Physician Assistant

## 2011-09-28 VITALS — BP 172/90 | HR 99 | Ht 75.0 in | Wt 317.0 lb

## 2011-09-28 DIAGNOSIS — R0683 Snoring: Secondary | ICD-10-CM

## 2011-09-28 DIAGNOSIS — R0600 Dyspnea, unspecified: Secondary | ICD-10-CM

## 2011-09-28 DIAGNOSIS — J449 Chronic obstructive pulmonary disease, unspecified: Secondary | ICD-10-CM

## 2011-09-28 DIAGNOSIS — I10 Essential (primary) hypertension: Secondary | ICD-10-CM

## 2011-09-28 DIAGNOSIS — R0609 Other forms of dyspnea: Secondary | ICD-10-CM

## 2011-09-28 DIAGNOSIS — I2510 Atherosclerotic heart disease of native coronary artery without angina pectoris: Secondary | ICD-10-CM

## 2011-09-28 MED ORDER — ALBUTEROL SULFATE HFA 108 (90 BASE) MCG/ACT IN AERS: 2.0000 | INHALATION_SPRAY | Freq: Four times a day (QID) | RESPIRATORY_TRACT | Status: DC | PRN

## 2011-09-28 MED ORDER — ALBUTEROL SULFATE HFA 108 (90 BASE) MCG/ACT IN AERS: 2.0000 | INHALATION_SPRAY | Freq: Four times a day (QID) | RESPIRATORY_TRACT | Status: AC | PRN

## 2011-09-28 MED ORDER — FUROSEMIDE 40 MG PO TABS: 60.0000 mg | ORAL_TABLET | Freq: Two times a day (BID) | ORAL | Status: DC

## 2011-09-28 MED ORDER — POTASSIUM CHLORIDE CRYS ER 20 MEQ PO TBCR: 20.0000 meq | EXTENDED_RELEASE_TABLET | Freq: Two times a day (BID) | ORAL | Status: DC

## 2011-09-28 NOTE — Progress Notes (Signed)
[address] [place], [place]  [address] Phone: [phone] Fax:  [phone]  Date:  09/28/2011   Name:  Dennis Vasquez       DOB:  June 29, 1946 MRN:  [id number]  PCP:  Dr. Alphonsus Sias Primary Cardiologist:  Dr. Valera Castle  Primary Electrophysiologist:  None    History of Present Illness: Dennis Vasquez is a 66 y.o. male who presents for post cath follow up.  He has a history of nonobstructive CAD, COPD, hypertension, hyperlipidemia, pulmonary nodule, BPH.  He saw Dr. Daleen Squibb 07/14/11 new dyspnea with exertion.  Stress test performed 07/21/11 demonstrated an EF of 61%, and small partially reversible inferior and apical defect consistent with inferior and apical thinning and mild inferior ischemia.  Cardiac catheterization is recommended.  LHC and are HC2/8/13: PCWP 17, CO2 7.4, CI 2.8, proximal LAD 40-50%, mid RCA 50%, distal RCA 50%, EF 55-65%, essentially normal intracardiac hemodynamics.  He has had back surgery since his cath.  Notes his BP was hard to control afterward.  He continues to have significant DOE.  Notes 2b-3 symptoms.  Sleeps on 2 pillows.  No PND.  Notes increased LE edema at times as well as increased abd girth.  Weight is stable by our scales.  Does admit to eating some foods high in salt, but otherwise does not add NaCl.  No ETOH.  No smoking.  No OTC meds or NSAIDs.  Notes snoring hx as well as witness apneic episodes.  Also admits to daytime hypersomnolence.    Past Medical History  Diagnosis Date  . Hypertension   . Coronary artery disease 2006    Non obstructive on cath 2006  . COPD (chronic obstructive pulmonary disease)     ON On PFT's and CT cgest. No desaturation on walking 11/2007. Started on  Spiriva  . Allergic rhinitis   . Hx of colonic polyps   . Hyperlipidemia   . Pulmonary nodule     6mm, stable Dec 2005 through Dec 2006 and May 2009, no further follow-up  . Osteoarthritis   . BPH (benign prostatic hyperplasia)   . Pneumonia  ~2001    out patient    Current Outpatient Prescriptions  Medication Sig Dispense Refill  . amLODipine-benazepril (LOTREL) 10-20 MG per capsule TAKE 1 CAPSULE DAILY  90 capsule  3  . aspirin 81 MG tablet Take 81 mg by mouth daily.        . cloNIDine (CATAPRES) 0.3 MG tablet Take 1 tablet (0.3 mg total) by mouth 3 (three) times daily.  270 tablet  3  . fish oil-omega-3 fatty acids 1000 MG capsule Take 2 g by mouth 2 (two) times daily.        . furosemide (LASIX) 40 MG tablet Take 1 tablet (40 mg total) by mouth 2 (two) times daily.  90 tablet  3  . insulin lispro (HUMALOG KWIKPEN) 100 UNIT/ML injection Inject into the skin 80 units just before the evening meal, and 60 with breakfast, and 25 unit with your meal at work.  180 mL  0  . Insulin Pen Needle (EASY TOUCH PEN NEEDLES) 31G X 6 MM MISC Any brand, use three times a day       . LEVITRA 20 MG tablet TAKE AS NEEDED  30 tablet  2  . Multiple Vitamin (MULTIVITAMIN) tablet Take 1 tablet by mouth daily.        Marland Kitchen oxyCODONE-acetaminophen (PERCOCET) 5-325 MG per tablet Take 2 tablets by mouth as  needed.       . potassium chloride SA (K-DUR,KLOR-CON) 20 MEQ tablet TAKE 1 TABLET DAILY  90 tablet  1  . pravastatin (PRAVACHOL) 80 MG tablet TAKE 1 TABLET AT BEDTIME  90 tablet  3    Allergies: No Known Allergies  History  Substance Use Topics  . Smoking status: Former Smoker    Quit date: 07/06/2003  . Smokeless tobacco: Never Used  . Alcohol Use: No     ROS:  Please see the history of present illness.   Notes occ nonproductive cough that is chronic.  No fevers.  No melena, hematochezia.  All other systems reviewed and negative.   PHYSICAL EXAM: VS:  BP 172/90  Pulse 99  Ht 6\' 3"  (1.905 m)  Wt 317 lb (143.79 kg)  BMI 39.62 kg/m2 Repeat BP by me on R 124/84  Well nourished, well developed, in no acute distress HEENT: normal Neck: I cannot appreciate JVD Cardiac:  normal S1, S2; RRR; no murmur Lungs:  clear to auscultation bilaterally,  no wheezing, rhonchi or rales Abd: soft, nontender, no hepatomegaly Ext: trace to 1+ bilat LE edema; right groin without hematoma or bruit  Skin: warm and dry Neuro:  CNs 2-12 intact, no focal abnormalities noted  EKG:  Sinus rhythm, heart rate 99, normal axis, nonspecific ST-T wave changes, No change from prior tracing  ASSESSMENT AND PLAN:  1. Dyspnea  Likely multifactorial.  RHC was ok last month. But with edema and symptoms, I suspect he has an element of diastolic dysfxn.  Will increase lasix to 60 mg bid and K+ to 20 mEq bid.  Check bmet and bnp and repeat bmet in one week.  He saw pulmonary in 2009.  Was to have sleep study.  Never followed up.  Also, he was put on Spiriva.  Patient stopped due to increased cough.  I will refer him back to pulmonary for follow up on COPD and to get evaluated for OSA, which I am certain he has.  Also, likely deconditioned with recent incapacitation from back injury and subsequent surgery.   2. Chronic airway obstruction, not elsewhere classified  Rx. For albuterol prn given.  Refer back to pulmonary.   3. CORONARY ARTERY DISEASE  Continue ASA and statin.  Follow up with Dr. Valera Castle in 2 mos.   4. HYPERTENSION  Repeat BP by me looks good.  Adjust Lasix as noted.  Monitor.   5. Snoring  Refer to pulmonary for sleep apnea eval.    Signed, Tereso Newcomer, PA-C  3:39 PM 09/28/2011

## 2011-09-28 NOTE — Patient Instructions (Signed)
Your physician recommends that you schedule a follow-up appointment in: 2 weeks with Dr Daleen Squibb Your physician recommends that you return for lab work drawn today (BMP and BNP) and in 1 week (BMP) Your physician has recommended you make the following change in your medication: INCREASE Furosemide to 60 mg twice daily and Potassium to 20 mEq twice daily and START Albuterol inhaler as needed You have been referred to Pulmonary for evaluation of Sleep apnea and Dyspnea (possible COPD) Your physician has requested that you have an echocardiogram. Echocardiography is a painless test that uses sound waves to create images of your heart. It provides your doctor with information about the size and shape of your heart and how well your heart's chambers and valves are working. This procedure takes approximately one hour. There are no restrictions for this procedure.    3 to 4 Gram Sodium Diet, No Added Salt (NAS) A 3 to 4 gram sodium diet restricts the amount of sodium in the diet to no more than 3 to 4 g or 3000 to 4000 mg daily. Limiting the amount of sodium is often used to help lower blood pressure. It is important if you have heart, liver, or kidney problems. Many foods contain sodium for flavor and sometimes as a preservative. When the amount of sodium in a diet needs to be low, it is important to know what to look for when choosing foods and drinks. The following includes some information and guidelines to help make it easier for you to adapt to a low sodium diet. QUICK TIPS  Do not add salt to food.   Avoid convenience items and fast food.   Choose unsalted snack foods.   Buy lower sodium products, often labeled as "lower sodium" or "no salt added."   Check food labels to learn how much sodium is in 1 serving.   When eating at a restaurant, ask that your food be prepared with less salt or none, if possible.  READING FOOD LABELS FOR SODIUM INFORMATION The nutrition facts label is a good place to  find how much sodium is in foods. Look for products with no more than 500 to 600 mg of sodium per meal and no more than 150 mg per serving. Remember that 3 to 4 g = 3000 to 4000 mg. The food label may also list foods as:  Sodium-free: Less than 5 mg in a serving.   Very low sodium: 35 mg or less in a serving.   Low-sodium: 140 mg or less in a serving.   Light in sodium: 50% less sodium in a serving. For example, if a food that usually has 300 mg of sodium is changed to become light in sodium, it will have 150 mg of sodium.   Reduced sodium: 25% less sodium in a serving. For example, if a food that usually has 400 mg of sodium is changed to reduced sodium, it will have 300 mg of sodium.  CHOOSING FOODS Grains  Avoid: Salted crackers and snack items. Bread stuffing and biscuit mixes. Seasoned rice or pasta mixes.   Choose: Unsalted snack items. English muffins, breads, and rolls. Homemade pancakes and waffles. Most cereals. Pasta.  Meats  Avoid: Salted, canned, smoked, spiced, pickled meats, including fish and poultry. Bacon, ham, sausage, cold cuts, hot dogs, anchovies.   Choose: Low-sodium canned tuna and salmon. Fresh or frozen meat, poultry, and fish.  Dairy  Avoid: Processed cheese and spreads. Cottage cheese. Buttermilk and condensed milk. Regular cheese.  Choose: Milk. Low-sodium cottage cheese. Yogurt. Sour cream. Low-sodium cheese.  Fruits and Vegetables  Avoid: Regular canned vegetables. Regular canned tomato sauce and paste. Frozen vegetables in sauces. Olives. Rosita Fire. Relishes. Sauerkraut.   Choose: Low-sodium canned vegetables. Low-sodium tomato sauce and paste. Frozen or fresh vegetables. Fresh and frozen fruit.  Condiments  Avoid: Canned and packaged gravies. Worcestershire sauce. Tartar sauce. Barbecue sauce. Soy sauce. Steak sauce. Ketchup. Onion, garlic, and table salt. Meat flavorings and tenderizers.   Choose: Fresh and dried herbs and spices. Low-sodium  varieties of mustard and ketchup. Lemon juice. Tabasco sauce. Horseradish.  SAMPLE 3 TO 4 GRAM SODIUM MEAL PLAN  Breakfast / Sodium (mg)  1 cup low-fat milk / 143 mg   2 slices whole-wheat toast / 270 mg   1 tbs heart-healthy margarine / 153 mg   1 hard-boiled egg / 139 mg  Lunch / Sodium (mg)  1 cup raw carrots / 76 mg    cup hummus / 298 mg   1 cup low-fat milk / 143 mg    cup red grapes / 2 mg   1 cup low-sodium chicken and rice soup / 480 mg   10 low-sodium saltine crackers / 191 mg  Dinner / Sodium (mg)  1 cup whole-wheat pasta / 2 mg   1 cup tomato sauce / 1178 mg   3 oz lean ground beef / 57 mg   1 small side salad (1 cup raw spinach leaves,  cup cucumber,  cup yellow bell pepper) / 25 mg   1 tsp ranch dressing / 144 mg  Snack / Sodium (mg)  1 slice cheddar cheese / 258 mg   1 medium apple / 1 mg  Nutrient Analysis  Calories: 2005   Protein: 85 g   Carbohydrate: 245 g   Fat: 78 g   Sodium: 3560 mg  Document Released: 06/21/2005 Document Revised: 06/10/2011 Document Reviewed: 09/22/2009 [facility] Patient Information 2012 [place], [place].

## 2011-09-29 ENCOUNTER — Telehealth: Payer: Self-pay | Admitting: *Deleted

## 2011-09-29 LAB — BASIC METABOLIC PANEL
Chloride: 100 mEq/L (ref 96–112)
Creatinine, Ser: 1.2 mg/dL (ref 0.4–1.5)
Potassium: 3.9 mEq/L (ref 3.5–5.1)
Sodium: 138 mEq/L (ref 135–145)

## 2011-09-29 LAB — BRAIN NATRIURETIC PEPTIDE: Pro B Natriuretic peptide (BNP): 29 pg/mL (ref 0.0–100.0)

## 2011-09-29 NOTE — Telephone Encounter (Signed)
lmom for ptcb to go over lab results and recommendations from PA. Danielle Rankin

## 2011-09-29 NOTE — Telephone Encounter (Signed)
F/U   Patient wife Dennis Vasquez. Returning  [employer]. Ms. [name] can be reached at [phone]

## 2011-09-29 NOTE — Telephone Encounter (Signed)
Message copied by Tarri Fuller on Wed Sep 29, 2011  4:05 PM ------      Message from: [place], [place] T      Created: Wed Sep 29, 2011  1:26 PM       Make sure he has repeat BMET in one week.      Renal fxn good.      K+ ok.      Sugar high.  Not sure if he is a known diabetic.  Sugar > 200 consistent with diabetes.         - needs follow up with PCP for probable diabetes      Tereso Newcomer, PA-C  1:26 PM 09/29/2011

## 2011-09-30 ENCOUNTER — Telehealth: Payer: Self-pay | Admitting: *Deleted

## 2011-09-30 DIAGNOSIS — I10 Essential (primary) hypertension: Secondary | ICD-10-CM

## 2011-09-30 NOTE — Telephone Encounter (Signed)
Message copied by Tarri Fuller on Thu Sep 30, 2011  8:37 AM ------      Message from: [place], [place] T      Created: Wed Sep 29, 2011  1:26 PM       Make sure he has repeat BMET in one week.      Renal fxn good.      K+ ok.      Sugar high.  Not sure if he is a known diabetic.  Sugar > 200 consistent with diabetes.         - needs follow up with PCP for probable diabetes      Tereso Newcomer, PA-C  1:26 PM 09/29/2011

## 2011-09-30 NOTE — Telephone Encounter (Signed)
bmet order placed today to be done 10/08/11. lmom for pt's wife that I have been unsuccessful in reaching to go over lab results and appt for repeat bmet to be done. Danielle Rankin

## 2011-09-30 NOTE — Telephone Encounter (Signed)
lmom for wife tcb to go over lab results. Left [facility] # for pt's wife tcb [phone]. Danielle Rankin

## 2011-09-30 NOTE — Telephone Encounter (Signed)
lmom for wife tcb to go over lab results. I left the [facility] # [id number] for pt's wife tcb. Danielle Rankin

## 2011-09-30 NOTE — Progress Notes (Signed)
I have tried to reach the pt's wife several times w/o success, to go over pt's lab results and for appt for repeat bmet. I am scheduling pt to have repeat bmet 10/08/11, I left this on my recent message to the wife labs ok but need repeat bmet next Friday 10/08/11. I lm that if she has questions she can call [phone] [facility] office or [phone] [facility] office. Danielle Rankin

## 2011-10-04 ENCOUNTER — Encounter: Payer: Self-pay | Admitting: *Deleted

## 2011-10-08 ENCOUNTER — Other Ambulatory Visit: Payer: BC Managed Care – PPO

## 2011-10-11 ENCOUNTER — Other Ambulatory Visit: Payer: BC Managed Care – PPO

## 2011-10-11 ENCOUNTER — Other Ambulatory Visit: Payer: Self-pay | Admitting: *Deleted

## 2011-10-11 ENCOUNTER — Ambulatory Visit (HOSPITAL_COMMUNITY): Payer: BC Managed Care – PPO

## 2011-10-11 ENCOUNTER — Ambulatory Visit (HOSPITAL_COMMUNITY): Payer: BC Managed Care – PPO | Attending: Cardiology

## 2011-10-11 ENCOUNTER — Other Ambulatory Visit (INDEPENDENT_AMBULATORY_CARE_PROVIDER_SITE_OTHER): Payer: BC Managed Care – PPO

## 2011-10-11 ENCOUNTER — Other Ambulatory Visit: Payer: Self-pay

## 2011-10-11 DIAGNOSIS — R0989 Other specified symptoms and signs involving the circulatory and respiratory systems: Secondary | ICD-10-CM | POA: Insufficient documentation

## 2011-10-11 DIAGNOSIS — I10 Essential (primary) hypertension: Secondary | ICD-10-CM | POA: Insufficient documentation

## 2011-10-11 DIAGNOSIS — R911 Solitary pulmonary nodule: Secondary | ICD-10-CM | POA: Insufficient documentation

## 2011-10-11 DIAGNOSIS — I2510 Atherosclerotic heart disease of native coronary artery without angina pectoris: Secondary | ICD-10-CM | POA: Insufficient documentation

## 2011-10-11 DIAGNOSIS — J449 Chronic obstructive pulmonary disease, unspecified: Secondary | ICD-10-CM | POA: Insufficient documentation

## 2011-10-11 DIAGNOSIS — R0609 Other forms of dyspnea: Secondary | ICD-10-CM | POA: Insufficient documentation

## 2011-10-11 DIAGNOSIS — E785 Hyperlipidemia, unspecified: Secondary | ICD-10-CM | POA: Insufficient documentation

## 2011-10-11 DIAGNOSIS — R0600 Dyspnea, unspecified: Secondary | ICD-10-CM

## 2011-10-11 DIAGNOSIS — J4489 Other specified chronic obstructive pulmonary disease: Secondary | ICD-10-CM | POA: Insufficient documentation

## 2011-10-12 ENCOUNTER — Telehealth: Payer: Self-pay | Admitting: *Deleted

## 2011-10-12 LAB — BASIC METABOLIC PANEL
CO2: 30 mEq/L (ref 19–32)
Calcium: 9.7 mg/dL (ref 8.4–10.5)
Chloride: 102 mEq/L (ref 96–112)
Sodium: 142 mEq/L (ref 135–145)

## 2011-10-12 NOTE — Telephone Encounter (Signed)
lmom echo ok.  Dennis Vasquez  

## 2011-10-12 NOTE — Telephone Encounter (Signed)
Message copied by Tarri Fuller on Tue Oct 12, 2011  9:38 AM ------      Message from: [place], [place] T      Created: Tue Oct 12, 2011  8:25 AM       Good LV function      Tereso Newcomer, [place]  8:25 AM 10/12/2011

## 2011-10-13 ENCOUNTER — Telehealth: Payer: Self-pay | Admitting: *Deleted

## 2011-10-13 DIAGNOSIS — I10 Essential (primary) hypertension: Secondary | ICD-10-CM

## 2011-10-13 NOTE — Telephone Encounter (Signed)
s/w pt's wife about lab results and med change on lasix. repeat bmet 4/18. Danielle Rankin

## 2011-10-13 NOTE — Telephone Encounter (Signed)
Message copied by Tarri Fuller on Wed Oct 13, 2011  9:21 AM ------      Message from: [place], [place] T      Created: Tue Oct 12, 2011  5:34 PM       Change Lasix to 60 mg in AM and 40 mg in PM      Repeat BMET in one week      Tereso Newcomer, PA-C  5:34 PM 10/12/2011

## 2011-10-21 ENCOUNTER — Other Ambulatory Visit: Payer: BC Managed Care – PPO

## 2011-10-25 ENCOUNTER — Institutional Professional Consult (permissible substitution): Payer: BC Managed Care – PPO | Admitting: Pulmonary Disease

## 2011-10-27 ENCOUNTER — Other Ambulatory Visit: Payer: Self-pay | Admitting: Neurological Surgery

## 2011-10-27 DIAGNOSIS — M5416 Radiculopathy, lumbar region: Secondary | ICD-10-CM

## 2011-10-29 ENCOUNTER — Other Ambulatory Visit: Payer: BC Managed Care – PPO

## 2011-10-29 ENCOUNTER — Ambulatory Visit
Admission: RE | Admit: 2011-10-29 | Discharge: 2011-10-29 | Disposition: A | Discharge status: Home / Self Care | Payer: BC Managed Care – PPO | Source: Ambulatory Visit | Attending: Neurological Surgery | Admitting: Neurological Surgery

## 2011-10-29 DIAGNOSIS — M5416 Radiculopathy, lumbar region: Secondary | ICD-10-CM

## 2011-10-29 MED ORDER — GADOBENATE DIMEGLUMINE 529 MG/ML IV SOLN
20.0000 mL | Freq: Once | INTRAVENOUS | Status: AC | PRN
  Administered 2011-10-29: 20 mL via INTRAVENOUS

## 2011-10-31 ENCOUNTER — Other Ambulatory Visit: Payer: BC Managed Care – PPO

## 2011-11-05 ENCOUNTER — Other Ambulatory Visit: Payer: Self-pay | Admitting: Neurological Surgery

## 2011-11-05 ENCOUNTER — Encounter (HOSPITAL_COMMUNITY): Payer: Self-pay | Admitting: Pharmacy Technician

## 2011-11-05 ENCOUNTER — Other Ambulatory Visit (INDEPENDENT_AMBULATORY_CARE_PROVIDER_SITE_OTHER): Payer: BC Managed Care – PPO

## 2011-11-05 DIAGNOSIS — I10 Essential (primary) hypertension: Secondary | ICD-10-CM

## 2011-11-05 LAB — BASIC METABOLIC PANEL
CO2: 29 mEq/L (ref 19–32)
Calcium: 9.5 mg/dL (ref 8.4–10.5)
Chloride: 101 mEq/L (ref 96–112)
Creatinine, Ser: 1.3 mg/dL (ref 0.4–1.5)
Glucose, Bld: 194 mg/dL — ABNORMAL HIGH (ref 70–99)
Sodium: 140 mEq/L (ref 135–145)

## 2011-11-08 ENCOUNTER — Telehealth: Payer: Self-pay | Admitting: *Deleted

## 2011-11-08 NOTE — Telephone Encounter (Signed)
Advised wife of labs and sent to Dr Alphonsus Sias PCP

## 2011-11-08 NOTE — Telephone Encounter (Signed)
Message copied by Burnell Blanks on Mon Nov 08, 2011  5:02 PM ------      Message from: [place], [place] T      Created: Mon Nov 08, 2011  1:23 PM       K+ and creatinine ok      Follow up with PCP for diabetes      Tereso Newcomer, PA-C  1:23 PM 11/08/2011

## 2011-11-09 ENCOUNTER — Encounter (HOSPITAL_COMMUNITY): Payer: Self-pay

## 2011-11-09 ENCOUNTER — Encounter (HOSPITAL_COMMUNITY)
Admission: RE | Admit: 2011-11-09 | Discharge: 2011-11-09 | Disposition: A | Discharge status: Home / Self Care | Payer: BC Managed Care – PPO | Source: Ambulatory Visit | Attending: Neurological Surgery | Admitting: Neurological Surgery

## 2011-11-09 HISTORY — DX: Other intervertebral disc displacement, lumbar region: M51.26

## 2011-11-09 HISTORY — DX: Toxic effect of chlorine gas, accidental (unintentional), initial encounter: T59.4X1A

## 2011-11-09 HISTORY — DX: Respiratory conditions due to other specified external agents: J70.8

## 2011-11-09 HISTORY — DX: Mixed hyperlipidemia: E78.2

## 2011-11-09 LAB — BASIC METABOLIC PANEL
Chloride: 101 mEq/L (ref 96–112)
GFR calc Af Amer: 80 mL/min — ABNORMAL LOW (ref >90)
GFR calc non Af Amer: 69 mL/min — ABNORMAL LOW (ref >90)
Potassium: 4 mEq/L (ref 3.5–5.1)
Sodium: 137 mEq/L (ref 135–145)

## 2011-11-09 LAB — SURGICAL PCR SCREEN: MRSA, PCR: NEGATIVE

## 2011-11-09 LAB — CBC
Hemoglobin: 14.8 g/dL (ref 13.0–17.0)
MCH: 29.8 pg (ref 26.0–34.0)
MCHC: 34.7 g/dL (ref 30.0–36.0)
Platelets: 240 10*3/uL (ref 150–400)
RDW: 13.4 % (ref 11.5–15.5)

## 2011-11-09 LAB — DIFFERENTIAL
Basophils Absolute: 0 10*3/uL (ref 0.0–0.1)
Basophils Relative: 0 % (ref 0–1)
Eosinophils Absolute: 0.2 10*3/uL (ref 0.0–0.7)
Neutro Abs: 8.1 10*3/uL — ABNORMAL HIGH (ref 1.7–7.7)
Neutrophils Relative %: 70 % (ref 43–77)

## 2011-11-09 NOTE — Pre-Procedure Instructions (Signed)
[address]  11/09/2011   Your procedure is scheduled on:  Wednesday, May 8  Report to [facility] Short Stay Center at 1:00 PM  Call this number if you have problems the morning of surgery: [phone]   Remember:   Do not eat food:After Midnight.  May have clear liquids: up to 4 Hours before arrival.  Clear liquids include soda, tea, black coffee, apple or grape juice, broth.  Take these medicines the morning of surgery with A SIP OF WATER: *Clonidine,Oxycodone**   Do not wear jewelry, make-up or nail polish.  Do not wear lotions, powders, or perfumes. You may wear deodorant.  Do not shave 48 hours prior to surgery.  Do not bring valuables to the hospital.  Contacts, dentures or bridgework may not be worn into surgery.  Leave suitcase in the car. After surgery it may be brought to your room.  For patients admitted to the hospital, checkout time is 11:00 AM the day of discharge.   Patients discharged the day of surgery will not be allowed to drive home.  Name and phone number of your driver: *n/a**  Special Instructions: CHG Shower Use Special Wash: 1/2 bottle night before surgery and 1/2 bottle morning of surgery.   Please read over the following fact sheets that you were given: Pain Booklet, Coughing and Deep Breathing, MRSA Information and Surgical Site Infection Prevention

## 2011-11-09 NOTE — Progress Notes (Signed)
Office called to  get orders signed and released.

## 2011-11-10 ENCOUNTER — Encounter (HOSPITAL_COMMUNITY): Payer: Self-pay | Admitting: Anesthesiology

## 2011-11-10 ENCOUNTER — Encounter (HOSPITAL_COMMUNITY)
Admission: RE | Disposition: A | Discharge status: Home / Self Care | Payer: Self-pay | Source: Ambulatory Visit | Attending: Neurological Surgery

## 2011-11-10 ENCOUNTER — Ambulatory Visit (HOSPITAL_COMMUNITY): Payer: BC Managed Care – PPO | Admitting: Anesthesiology

## 2011-11-10 ENCOUNTER — Encounter (HOSPITAL_COMMUNITY): Payer: Self-pay | Admitting: *Deleted

## 2011-11-10 ENCOUNTER — Ambulatory Visit (HOSPITAL_COMMUNITY)
Admission: RE | Admit: 2011-11-10 | Discharge: 2011-11-12 | Disposition: A | Discharge status: Home / Self Care | Payer: BC Managed Care – PPO | Source: Ambulatory Visit | Attending: Neurological Surgery | Admitting: Neurological Surgery

## 2011-11-10 ENCOUNTER — Ambulatory Visit (HOSPITAL_COMMUNITY): Payer: BC Managed Care – PPO

## 2011-11-10 DIAGNOSIS — I2510 Atherosclerotic heart disease of native coronary artery without angina pectoris: Secondary | ICD-10-CM | POA: Insufficient documentation

## 2011-11-10 DIAGNOSIS — Z794 Long term (current) use of insulin: Secondary | ICD-10-CM | POA: Insufficient documentation

## 2011-11-10 DIAGNOSIS — Z01812 Encounter for preprocedural laboratory examination: Secondary | ICD-10-CM | POA: Insufficient documentation

## 2011-11-10 DIAGNOSIS — I10 Essential (primary) hypertension: Secondary | ICD-10-CM | POA: Insufficient documentation

## 2011-11-10 DIAGNOSIS — Z9889 Other specified postprocedural states: Secondary | ICD-10-CM

## 2011-11-10 DIAGNOSIS — J4489 Other specified chronic obstructive pulmonary disease: Secondary | ICD-10-CM | POA: Insufficient documentation

## 2011-11-10 DIAGNOSIS — M5126 Other intervertebral disc displacement, lumbar region: Secondary | ICD-10-CM | POA: Insufficient documentation

## 2011-11-10 DIAGNOSIS — J449 Chronic obstructive pulmonary disease, unspecified: Secondary | ICD-10-CM | POA: Insufficient documentation

## 2011-11-10 DIAGNOSIS — E119 Type 2 diabetes mellitus without complications: Secondary | ICD-10-CM | POA: Insufficient documentation

## 2011-11-10 DIAGNOSIS — E785 Hyperlipidemia, unspecified: Secondary | ICD-10-CM | POA: Insufficient documentation

## 2011-11-10 HISTORY — PX: LUMBAR LAMINECTOMY/DECOMPRESSION MICRODISCECTOMY: SHX5026

## 2011-11-10 SURGERY — LUMBAR LAMINECTOMY/DECOMPRESSION MICRODISCECTOMY 1 LEVEL
Anesthesia: General | Site: Back | Laterality: Right | Wound class: Clean

## 2011-11-10 MED ORDER — HEMOSTATIC AGENTS (NO CHARGE) OPTIME
TOPICAL | Status: DC | PRN
  Administered 2011-11-10: 1 via TOPICAL

## 2011-11-10 MED ORDER — PHENOL 1.4 % MT LIQD: 1.0000 | OROMUCOSAL | Status: DC | PRN

## 2011-11-10 MED ORDER — MIDAZOLAM HCL 5 MG/5ML IJ SOLN
INTRAMUSCULAR | Status: DC | PRN
  Administered 2011-11-10: 2 mg via INTRAVENOUS

## 2011-11-10 MED ORDER — CEFAZOLIN SODIUM-DEXTROSE 2-3 GM-% IV SOLR
INTRAVENOUS | Status: AC
  Filled 2011-11-10: qty 50

## 2011-11-10 MED ORDER — CLONIDINE HCL 0.3 MG PO TABS
0.3000 mg | ORAL_TABLET | Freq: Three times a day (TID) | ORAL | Status: DC
  Administered 2011-11-10 – 2011-11-11 (×4): 0.3 mg via ORAL
  Filled 2011-11-10 (×7): qty 1

## 2011-11-10 MED ORDER — MUPIROCIN 2 % EX OINT
TOPICAL_OINTMENT | Freq: Two times a day (BID) | CUTANEOUS | Status: DC
  Administered 2011-11-10 – 2011-11-11 (×4): via NASAL
  Filled 2011-11-10: qty 22

## 2011-11-10 MED ORDER — SUCCINYLCHOLINE CHLORIDE 20 MG/ML IJ SOLN
INTRAMUSCULAR | Status: DC | PRN
  Administered 2011-11-10: 140 mg via INTRAVENOUS

## 2011-11-10 MED ORDER — MORPHINE SULFATE 4 MG/ML IJ SOLN: 0.0500 mg/kg | INTRAMUSCULAR | Status: DC | PRN

## 2011-11-10 MED ORDER — ONDANSETRON HCL 4 MG/2ML IJ SOLN
INTRAMUSCULAR | Status: DC | PRN
  Administered 2011-11-10: 4 mg via INTRAVENOUS

## 2011-11-10 MED ORDER — ROCURONIUM BROMIDE 100 MG/10ML IV SOLN
INTRAVENOUS | Status: DC | PRN
  Administered 2011-11-10: 40 mg via INTRAVENOUS

## 2011-11-10 MED ORDER — BUPIVACAINE HCL (PF) 0.25 % IJ SOLN
INTRAMUSCULAR | Status: DC | PRN
  Administered 2011-11-10: 30 mL

## 2011-11-10 MED ORDER — HYDROMORPHONE HCL PF 1 MG/ML IJ SOLN
INTRAMUSCULAR | Status: AC
  Filled 2011-11-10: qty 1

## 2011-11-10 MED ORDER — ACETAMINOPHEN 325 MG PO TABS: 650.0000 mg | ORAL_TABLET | ORAL | Status: DC | PRN

## 2011-11-10 MED ORDER — LIDOCAINE HCL (CARDIAC) 20 MG/ML IV SOLN
INTRAVENOUS | Status: DC | PRN
  Administered 2011-11-10: 100 mg via INTRAVENOUS

## 2011-11-10 MED ORDER — SODIUM CHLORIDE 0.9 % IJ SOLN
3.0000 mL | Freq: Two times a day (BID) | INTRAMUSCULAR | Status: DC
  Administered 2011-11-10 – 2011-11-11 (×3): 3 mL via INTRAVENOUS

## 2011-11-10 MED ORDER — ZOLPIDEM TARTRATE 5 MG PO TABS: 10.0000 mg | ORAL_TABLET | Freq: Every evening | ORAL | Status: DC | PRN

## 2011-11-10 MED ORDER — CEFAZOLIN SODIUM 1-5 GM-% IV SOLN
INTRAVENOUS | Status: DC | PRN
  Administered 2011-11-10: 2 g via INTRAVENOUS

## 2011-11-10 MED ORDER — SODIUM CHLORIDE 0.9 % IR SOLN
Status: DC | PRN
  Administered 2011-11-10: 16:00:00

## 2011-11-10 MED ORDER — SODIUM CHLORIDE 0.9 % IV SOLN: 250.0000 mL | INTRAVENOUS | Status: DC

## 2011-11-10 MED ORDER — AMLODIPINE BESY-BENAZEPRIL HCL 10-20 MG PO CAPS: 1.0000 | ORAL_CAPSULE | Freq: Every day | ORAL | Status: DC

## 2011-11-10 MED ORDER — AMLODIPINE BESYLATE 10 MG PO TABS
10.0000 mg | ORAL_TABLET | Freq: Every day | ORAL | Status: DC
  Administered 2011-11-11: 10 mg via ORAL
  Filled 2011-11-10 (×2): qty 1

## 2011-11-10 MED ORDER — BENAZEPRIL HCL 20 MG PO TABS
20.0000 mg | ORAL_TABLET | Freq: Every day | ORAL | Status: DC
  Administered 2011-11-11: 20 mg via ORAL
  Filled 2011-11-10 (×2): qty 1

## 2011-11-10 MED ORDER — BACITRACIN 50000 UNITS IM SOLR
INTRAMUSCULAR | Status: AC
  Filled 2011-11-10: qty 1

## 2011-11-10 MED ORDER — FENTANYL CITRATE 0.05 MG/ML IJ SOLN
INTRAMUSCULAR | Status: DC | PRN
  Administered 2011-11-10 (×2): 100 ug via INTRAVENOUS
  Administered 2011-11-10: 150 ug via INTRAVENOUS

## 2011-11-10 MED ORDER — HYDROMORPHONE HCL PF 1 MG/ML IJ SOLN
0.2500 mg | INTRAMUSCULAR | Status: DC | PRN
  Administered 2011-11-10 (×2): 0.5 mg via INTRAVENOUS

## 2011-11-10 MED ORDER — SENNA 8.6 MG PO TABS
1.0000 | ORAL_TABLET | Freq: Two times a day (BID) | ORAL | Status: DC
  Administered 2011-11-10 – 2011-11-11 (×3): 8.6 mg via ORAL
  Filled 2011-11-10 (×5): qty 1

## 2011-11-10 MED ORDER — GLYCOPYRROLATE 0.2 MG/ML IJ SOLN
INTRAMUSCULAR | Status: DC | PRN
  Administered 2011-11-10: .8 mg via INTRAVENOUS

## 2011-11-10 MED ORDER — MENTHOL 3 MG MT LOZG: 1.0000 | LOZENGE | OROMUCOSAL | Status: DC | PRN

## 2011-11-10 MED ORDER — ACETAMINOPHEN 650 MG RE SUPP: 650.0000 mg | RECTAL | Status: DC | PRN

## 2011-11-10 MED ORDER — NEOSTIGMINE METHYLSULFATE 1 MG/ML IJ SOLN
INTRAMUSCULAR | Status: DC | PRN
  Administered 2011-11-10: 5 mg via INTRAVENOUS

## 2011-11-10 MED ORDER — MUPIROCIN 2 % EX OINT
TOPICAL_OINTMENT | CUTANEOUS | Status: AC
  Filled 2011-11-10: qty 22

## 2011-11-10 MED ORDER — ONDANSETRON HCL 4 MG/2ML IJ SOLN: 4.0000 mg | Freq: Once | INTRAMUSCULAR | Status: DC | PRN

## 2011-11-10 MED ORDER — POTASSIUM CHLORIDE IN NACL 20-0.9 MEQ/L-% IV SOLN
INTRAVENOUS | Status: DC
  Filled 2011-11-10 (×4): qty 1000

## 2011-11-10 MED ORDER — INSULIN ASPART 100 UNIT/ML ~~LOC~~ SOLN
0.0000 [IU] | Freq: Three times a day (TID) | SUBCUTANEOUS | Status: DC
Start: 2011-11-10 — End: 2011-11-11
  Administered 2011-11-10 – 2011-11-11 (×2): 5 [IU] via SUBCUTANEOUS
  Administered 2011-11-11: 8 [IU] via SUBCUTANEOUS
  Administered 2011-11-11: 5 [IU] via SUBCUTANEOUS
  Filled 2011-11-10: qty 3

## 2011-11-10 MED ORDER — CYCLOBENZAPRINE HCL 10 MG PO TABS
10.0000 mg | ORAL_TABLET | Freq: Three times a day (TID) | ORAL | Status: DC | PRN
  Administered 2011-11-10 – 2011-11-11 (×3): 10 mg via ORAL
  Filled 2011-11-10 (×3): qty 1

## 2011-11-10 MED ORDER — LACTATED RINGERS IV SOLN
INTRAVENOUS | Status: DC | PRN
  Administered 2011-11-10: 15:00:00 via INTRAVENOUS

## 2011-11-10 MED ORDER — CEFAZOLIN SODIUM 1-5 GM-% IV SOLN
1.0000 g | Freq: Three times a day (TID) | INTRAVENOUS | Status: AC
  Administered 2011-11-10 – 2011-11-11 (×2): 1 g via INTRAVENOUS
  Filled 2011-11-10 (×2): qty 50

## 2011-11-10 MED ORDER — SODIUM CHLORIDE 0.9 % IJ SOLN: 3.0000 mL | INTRAMUSCULAR | Status: DC | PRN

## 2011-11-10 MED ORDER — ONDANSETRON HCL 4 MG/2ML IJ SOLN: 4.0000 mg | INTRAMUSCULAR | Status: DC | PRN

## 2011-11-10 MED ORDER — ALBUTEROL SULFATE HFA 108 (90 BASE) MCG/ACT IN AERS: 2.0000 | INHALATION_SPRAY | Freq: Four times a day (QID) | RESPIRATORY_TRACT | Status: DC | PRN

## 2011-11-10 MED ORDER — PROPOFOL 10 MG/ML IV EMUL
INTRAVENOUS | Status: DC | PRN
  Administered 2011-11-10: 100 mg via INTRAVENOUS

## 2011-11-10 MED ORDER — THROMBIN 5000 UNITS EX SOLR
CUTANEOUS | Status: DC | PRN
  Administered 2011-11-10 (×2): 5000 [IU] via TOPICAL

## 2011-11-10 MED ORDER — SODIUM CHLORIDE 0.9 % IV SOLN
INTRAVENOUS | Status: AC
  Filled 2011-11-10: qty 500

## 2011-11-10 MED ORDER — HYDROMORPHONE HCL PF 1 MG/ML IJ SOLN
0.5000 mg | INTRAMUSCULAR | Status: DC | PRN
  Administered 2011-11-10 – 2011-11-11 (×2): 1 mg via INTRAVENOUS
  Filled 2011-11-10 (×2): qty 1

## 2011-11-10 MED ORDER — FUROSEMIDE 40 MG PO TABS
60.0000 mg | ORAL_TABLET | Freq: Two times a day (BID) | ORAL | Status: DC
  Administered 2011-11-11 – 2011-11-12 (×3): 60 mg via ORAL
  Filled 2011-11-10 (×5): qty 1

## 2011-11-10 MED ORDER — OXYCODONE-ACETAMINOPHEN 5-325 MG PO TABS
1.0000 | ORAL_TABLET | ORAL | Status: DC | PRN
  Administered 2011-11-10 – 2011-11-11 (×4): 2 via ORAL
  Filled 2011-11-10 (×4): qty 2

## 2011-11-10 SURGICAL SUPPLY — 48 items
ADH SKN CLS APL DERMABOND .7 (GAUZE/BANDAGES/DRESSINGS) ×1
APL SKNCLS STERI-STRIP NONHPOA (GAUZE/BANDAGES/DRESSINGS) ×1
BAG DECANTER FOR FLEXI CONT (MISCELLANEOUS) ×2 IMPLANT
BENZOIN TINCTURE PRP APPL 2/3 (GAUZE/BANDAGES/DRESSINGS) ×2 IMPLANT
BUR MATCHSTICK NEURO 3.0 LAGG (BURR) ×2 IMPLANT
CANISTER SUCTION 2500CC (MISCELLANEOUS) ×2 IMPLANT
CLOTH BEACON ORANGE TIMEOUT ST (SAFETY) ×2 IMPLANT
CONT SPEC 4OZ CLIKSEAL STRL BL (MISCELLANEOUS) ×2 IMPLANT
DERMABOND ADVANCED (GAUZE/BANDAGES/DRESSINGS) ×1
DERMABOND ADVANCED .7 DNX12 (GAUZE/BANDAGES/DRESSINGS) IMPLANT
DRAPE LAPAROTOMY 100X72X124 (DRAPES) ×2 IMPLANT
DRAPE MICROSCOPE ZEISS OPMI (DRAPES) ×2 IMPLANT
DRAPE POUCH INSTRU U-SHP 10X18 (DRAPES) ×2 IMPLANT
DRAPE SURG 17X23 STRL (DRAPES) ×2 IMPLANT
DRESSING TELFA 8X3 (GAUZE/BANDAGES/DRESSINGS) ×2 IMPLANT
DRSG OPSITE 4X5.5 SM (GAUZE/BANDAGES/DRESSINGS) ×2 IMPLANT
DURAPREP 26ML APPLICATOR (WOUND CARE) ×2 IMPLANT
ELECT REM PT RETURN 9FT ADLT (ELECTROSURGICAL) ×2
ELECTRODE REM PT RTRN 9FT ADLT (ELECTROSURGICAL) ×1 IMPLANT
GAUZE SPONGE 4X4 16PLY XRAY LF (GAUZE/BANDAGES/DRESSINGS) IMPLANT
GLOVE BIO SURGEON STRL SZ8 (GLOVE) ×2 IMPLANT
GLOVE ECLIPSE 7.5 STRL STRAW (GLOVE) ×2 IMPLANT
GLOVE INDICATOR 7.5 STRL GRN (GLOVE) ×1 IMPLANT
GLOVE INDICATOR 8.0 STRL GRN (GLOVE) ×1 IMPLANT
GOWN BRE IMP SLV AUR LG STRL (GOWN DISPOSABLE) IMPLANT
GOWN BRE IMP SLV AUR XL STRL (GOWN DISPOSABLE) ×3 IMPLANT
GOWN STRL REIN 2XL LVL4 (GOWN DISPOSABLE) ×1 IMPLANT
HEMOSTAT POWDER KIT SURGIFOAM (HEMOSTASIS) IMPLANT
KIT BASIN OR (CUSTOM PROCEDURE TRAY) ×2 IMPLANT
KIT ROOM TURNOVER OR (KITS) ×2 IMPLANT
NDL HYPO 25X1 1.5 SAFETY (NEEDLE) ×1 IMPLANT
NDL SPNL 20GX3.5 QUINCKE YW (NEEDLE) IMPLANT
NEEDLE HYPO 25X1 1.5 SAFETY (NEEDLE) ×2 IMPLANT
NEEDLE SPNL 20GX3.5 QUINCKE YW (NEEDLE) IMPLANT
NS IRRIG 1000ML POUR BTL (IV SOLUTION) ×2 IMPLANT
PACK LAMINECTOMY NEURO (CUSTOM PROCEDURE TRAY) ×2 IMPLANT
PAD ARMBOARD 7.5X6 YLW CONV (MISCELLANEOUS) ×6 IMPLANT
RUBBERBAND STERILE (MISCELLANEOUS) ×4 IMPLANT
SPONGE SURGIFOAM ABS GEL SZ50 (HEMOSTASIS) ×2 IMPLANT
STRIP CLOSURE SKIN 1/2X4 (GAUZE/BANDAGES/DRESSINGS) ×2 IMPLANT
SUT VIC AB 0 CT1 18XCR BRD8 (SUTURE) ×1 IMPLANT
SUT VIC AB 0 CT1 8-18 (SUTURE) ×2
SUT VIC AB 2-0 CP2 18 (SUTURE) ×2 IMPLANT
SUT VIC AB 3-0 SH 8-18 (SUTURE) ×2 IMPLANT
SYR 20ML ECCENTRIC (SYRINGE) ×2 IMPLANT
TOWEL OR 17X24 6PK STRL BLUE (TOWEL DISPOSABLE) ×2 IMPLANT
TOWEL OR 17X26 10 PK STRL BLUE (TOWEL DISPOSABLE) ×2 IMPLANT
WATER STERILE IRR 1000ML POUR (IV SOLUTION) ×2 IMPLANT

## 2011-11-10 NOTE — Transfer of Care (Signed)
Immediate Anesthesia Transfer of Care Note  Patient: Dennis Vasquez  Procedure(s) Performed: Procedure(s) (LRB): LUMBAR LAMINECTOMY/DECOMPRESSION MICRODISCECTOMY 1 LEVEL (Right)  Patient Location: PACU  Anesthesia Type: General  Level of Consciousness: awake, alert , oriented and patient cooperative  Airway & Oxygen Therapy: Patient Spontanous Breathing and Patient connected to nasal cannula oxygen  Post-op Assessment: Report given to PACU RN, Post -op Vital signs reviewed and stable and Patient moving all extremities X 4  Post vital signs: Reviewed and stable  Complications: No apparent anesthesia complications

## 2011-11-10 NOTE — Anesthesia Postprocedure Evaluation (Signed)
  Anesthesia Post-op Note  Patient: Dennis Vasquez  Procedure(s) Performed: Procedure(s) (LRB): LUMBAR LAMINECTOMY/DECOMPRESSION MICRODISCECTOMY 1 LEVEL (Right)  Patient Location: PACU  Anesthesia Type: General  Level of Consciousness: awake, alert  and oriented  Airway and Oxygen Therapy: Patient Spontanous Breathing and Patient connected to nasal cannula oxygen  Post-op Pain: mild  Post-op Assessment: Post-op Vital signs reviewed  Post-op Vital Signs: Reviewed  Complications: No apparent anesthesia complications

## 2011-11-10 NOTE — Anesthesia Preprocedure Evaluation (Addendum)
Anesthesia Evaluation  Patient identified by MRN, date of birth, ID band Patient awake    Airway Mallampati: I TM Distance: >3 FB Neck ROM: Full    Dental  (+) Teeth Intact and Dental Advisory Given   Pulmonary with exertion, pneumonia , COPDformer smoker breath sounds clear to auscultation        Cardiovascular hypertension, Pt. on medications + CAD Rhythm:Regular Rate:Normal     Neuro/Psych    GI/Hepatic   Endo/Other  Diabetes mellitus-, Well Controlled, Type 2, Insulin DependentMorbid obesity  Renal/GU      Musculoskeletal   Abdominal   Peds  Hematology   Anesthesia Other Findings   Reproductive/Obstetrics                         Anesthesia Physical Anesthesia Plan  ASA: III  Anesthesia Plan: General   Post-op Pain Management:    Induction: Intravenous  Airway Management Planned: Oral ETT  Additional Equipment:   Intra-op Plan:   Post-operative Plan: Extubation in OR  Informed Consent: I have reviewed the patients History and Physical, chart, labs and discussed the procedure including the risks, benefits and alternatives for the proposed anesthesia with the patient or authorized representative who has indicated his/her understanding and acceptance.   Dental advisory given  Plan Discussed with: CRNA, Anesthesiologist and Surgeon  Anesthesia Plan Comments:         Anesthesia Quick Evaluation

## 2011-11-10 NOTE — Op Note (Signed)
11/10/2011  5:01 PM  PATIENT:  Dennis Vasquez  66 y.o. 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 Newell Coral  ANESTHESIA:   General  EBL: 25 ml  Total I/O In: 2000 [I.V.:2000] Out: -   BLOOD ADMINISTERED:none  DRAINS: None   SPECIMEN:  No Specimen  INDICATION FOR PROCEDURE: Patient had a microdiscectomy about 2-1/2 months ago at L3-4 with a full laminectomy. Complaint of recurrent leg pain with leg weakness. MRI showed a large recurrent disc herniation. I recommended a redo microdiscectomy at L3-4 on the right. Patient understood the risks, benefits, and alternatives and potential outcomes and wished to proceed.  PROCEDURE DETAILS: The patient was taken to the operating room and after induction of adequate generalized endotracheal anesthesia, the patient was rolled into the prone position on the Wilson frame and all pressure points were padded. The lumbar region was cleaned and then prepped with DuraPrep and draped in the usual sterile fashion. 5 cc of local anesthesia was injected and then a dorsal midline incision was made and carried down to the lumbo sacral fascia. The fascia was opened and the paraspinous musculature was taken down in a subperiosteal fashion to expose L3-4 on the right. Intraoperative x-ray confirmed my level, and then I used a combination of the high-speed drill and the Kerrison punches to widen the old laminectomy. The underlying yellow ligament was opened and removed in a piecemeal fashion to expose the underlying dura and exiting nerve root. I undercut the lateral recess and dissected down until I was medial to and distal to the pedicle. There was significant scar tissue that was dissected away from the lateral edge of  the dura. The nerve root was well decompressed. We then gently retracted the nerve root medially with a retractor, identified the disc space as well as the recurrent disc protrusion, performed a thorough intradiscal discectomy with pituitary rongeurs and curettes, until I had a nice decompression of the nerve root and the midline. I found a large free fragment distal to the disc space exactly where expected to be as well as a large midline fragment. I then palpated with a coronary dilator along the nerve root and into the foramen to assure adequate decompression. I felt no more compression of the nerve root. I irrigated with saline solution containing bacitracin. Achieved hemostasis with bipolar cautery, lined the dura with Gelfoam, and then closed the fascia with 0 Vicryl. I closed the subcutaneous tissues with 2-0 Vicryl and the subcuticular tissues with 3-0 Vicryl. The skin was then closed with benzoin and Steri-Strips. The drapes were removed, a sterile dressing was applied. The patient was awakened from general anesthesia and transferred to the recovery room in stable condition. At the end of the procedure all sponge, needle and instrument counts were correct.   PLAN OF CARE: Admit for overnight observation  PATIENT DISPOSITION:  PACU - hemodynamically stable.   Delay start of Pharmacological VTE agent (>24hrs) due to surgical blood loss or risk of bleeding:  yes
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
## 2011-11-10 NOTE — Addendum Note (Signed)
Addendum  created 11/10/11 1721 by Kerby Nora, MD   Modules edited:Anesthesia Attestations

## 2011-11-10 NOTE — Progress Notes (Signed)
CBG 203 upon admission to Neuro PACU.  Dr. Ivin Booty notified; did not want to cover patient with sliding scale at this time.

## 2011-11-10 NOTE — Addendum Note (Signed)
Addendum  created 11/10/11 1721 by Zyler Hyson A Ido Wollman, MD   Modules edited:Anesthesia Attestations    

## 2011-11-11 LAB — GLUCOSE, CAPILLARY
Glucose-Capillary: 203 mg/dL — ABNORMAL HIGH (ref 70–99)
Glucose-Capillary: 236 mg/dL — ABNORMAL HIGH (ref 70–99)

## 2011-11-11 MED ORDER — INSULIN ASPART 100 UNIT/ML ~~LOC~~ SOLN
0.0000 [IU] | Freq: Every day | SUBCUTANEOUS | Status: DC
  Administered 2011-11-11: 4 [IU] via SUBCUTANEOUS

## 2011-11-11 MED ORDER — CELECOXIB 200 MG PO CAPS
200.0000 mg | ORAL_CAPSULE | Freq: Two times a day (BID) | ORAL | Status: DC
  Administered 2011-11-11 (×2): 200 mg via ORAL
  Filled 2011-11-11 (×4): qty 1

## 2011-11-11 MED ORDER — DEXAMETHASONE SODIUM PHOSPHATE 4 MG/ML IJ SOLN
4.0000 mg | Freq: Four times a day (QID) | INTRAMUSCULAR | Status: AC
  Administered 2011-11-11 – 2011-11-12 (×4): 4 mg via INTRAVENOUS
  Filled 2011-11-11 (×5): qty 1

## 2011-11-11 MED ORDER — INSULIN ASPART 100 UNIT/ML ~~LOC~~ SOLN
0.0000 [IU] | Freq: Three times a day (TID) | SUBCUTANEOUS | Status: DC
  Administered 2011-11-12: 15 [IU] via SUBCUTANEOUS

## 2011-11-11 NOTE — Progress Notes (Signed)
Patient ID: Dennis Vasquez, male   DOB: Aug 28, 1945, 66 y.o.   MRN: [id number] Was doing great and amb without pain until he fell asleep on his right side, got up on noted R ant quad pain. No N/T and no weakness. No headache. Incision CDI. Amb slowly, voids well this am. Will try decadron and celebrex. Radiculitis? Hematoma? CSF Leak (none apparent at surgery)? Recurrent HNP? Will monitor for now.

## 2011-11-12 ENCOUNTER — Encounter (HOSPITAL_COMMUNITY): Payer: Self-pay | Admitting: Neurological Surgery

## 2011-11-12 LAB — GLUCOSE, CAPILLARY: Glucose-Capillary: 278 mg/dL — ABNORMAL HIGH (ref 70–99)

## 2011-11-12 MED FILL — Insulin Aspart Inj 100 Unit/ML: SUBCUTANEOUS | Qty: 0.05 | Status: AC

## 2011-11-12 NOTE — Discharge Summary (Signed)
Physician Discharge Summary  Patient ID: Dennis Vasquez MRN: [id number] DOB/AGE: Jul 02, 1946 65 y.o.  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 The patient tolerated the procedure well and was taken to the recovery room and then to the floor in stable condition. The hospital course was routine. There were no complications. The wound remained clean dry and intact. Pt had appropriate back  soreness. He had R quad pain that resolved by discharge but had no new N/T/W. The patient remained afebrile with stable vital signs, and tolerated a regular diet. The patient continued to increase activities, and pain was well controlled with oral pain medications.   Consults: None  Significant Diagnostic Studies:  Results for orders placed during the hospital encounter of 11/10/11  GLUCOSE, CAPILLARY      Component Value Range   Glucose-Capillary 175 (*) 70 - 99 (mg/dL)  GLUCOSE, CAPILLARY      Component Value Range   Glucose-Capillary 206 (*) 70 - 99 (mg/dL)  GLUCOSE, CAPILLARY      Component Value Range   Glucose-Capillary 244 (*) 70 - 99 (mg/dL)   Comment 1 Notify RN     Comment 2 Documented in Chart    GLUCOSE, CAPILLARY      Component Value Range   Glucose-Capillary 231 (*) 70 - 99 (mg/dL)   Comment 1 Documented in Chart     Comment 2 Notify RN    GLUCOSE, CAPILLARY      Component Value Range   Glucose-Capillary 203 (*) 70 - 99 (mg/dL)   Comment 1 Notify RN    GLUCOSE, CAPILLARY      Component Value Range   Glucose-Capillary 236 (*) 70 - 99 (mg/dL)  GLUCOSE, CAPILLARY      Component Value Range   Glucose-Capillary 174 (*) 70 - 99 (mg/dL)    Mr Lumbar Spine W Wo Contrast  10/29/2011  *RADIOLOGY REPORT*  Clinical Data: Low back  pain with right leg pain.  Back surgery 08/30/2011  MRI LUMBAR SPINE WITHOUT AND WITH CONTRAST  Technique:  Multiplanar and multiecho pulse sequences of the lumbar spine were obtained without and with intravenous contrast.  Contrast: 20mL MULTIHANCE GADOBENATE DIMEGLUMINE 529 MG/ML IV SOLN  Comparison: Lumbar MRI 08/05/2011  Findings: Recent laminectomy at L3-4.  Normal lumbar alignment. Negative for fracture or mass.  Conus medullaris is normal.  L1-2:  Mild disc and mild facet degeneration without significant spinal stenosis.  4.5 cm left renal cyst is unchanged.  L2-3:  Mild disc and facet degeneration without disc protrusion or stenosis.  L3-4:  Bilateral laminectomy.  Fluid collection in the laminectomy bed posterior to the dura measures 13 x 20 mm.  This may be a simple postop collection versus infection or CSF leak.  Bilateral facet hypertrophy.  Recurrent moderate to large disc protrusion on the right contributing to spinal stenosis and impingement of the right L4 nerve root in the lateral recess.  This is similar in size to the original disc protrusion.  Postcontrast imaging reveals peripheral enhancement around the disc protrusion however the disc protrusion does not enhance.  L4-5:  Disc degeneration with disc bulging.  Bilateral facet and ligamentum flavum hypertrophy with mild spinal stenosis.  This is unchanged.  L5-S1:  Disc degeneration with spondylosis and bilateral facet degeneration.  Foraminal narrowing bilaterally.  IMPRESSION: Postop laminectomy bilaterally L3-4.  Recurrent right-sided disc protrusion indenting the thecal sac and causing spinal stenosis and impingement of the right L4 nerve root.  In addition, there is a postop fluid collection in the surgical bed.  Disc and facet degeneration L4-5 and L5-S1 are unchanged from the prior MRI.  Original Report Authenticated By: Camelia Phenes, M.D.   Dg Lumbar Spine 1 View  11/10/2011  *RADIOLOGY REPORT*  Clinical Data: Lumbar surgery  LUMBAR  SPINE - 1 VIEW  Comparison: MR 10/29/2011  Findings: This single lateral intraoperative lumbar radiograph shows metallic probe from posterior approach projecting between the L3 and L4 spinous processes.  IMPRESSION:  1.  Intraoperative localization  Original Report Authenticated By: Osa Craver, M.D.    Antibiotics:  Anti-infectives     Start     Dose/Rate Route Frequency Ordered Stop   11/10/11 2200   ceFAZolin (ANCEF) IVPB 1 g/50 mL premix        1 g 100 mL/hr over 30 Minutes Intravenous Every 8 hours 11/10/11 2015 11/11/11 0558   11/10/11 1538   bacitracin 50,000 Units in sodium chloride irrigation 0.9 % 500 mL irrigation  Status:  Discontinued          As needed 11/10/11 1538 11/10/11 1709   11/10/11 1506   ceFAZolin (ANCEF) 2-3 GM-% IVPB SOLR     Comments: DAY, DORY: cabinet override         11/10/11 1506 11/11/11 0314   11/10/11 1455   bacitracin 95621 UNITS injection     Comments: DAY, DORY: cabinet override         11/10/11 1455 11/11/11 0259          Discharge Exam: Blood pressure 165/90, pulse 101, temperature 98 F (36.7 C), temperature source Oral, resp. rate 16, SpO2 92.00%. Neurologic: Grossly normal Incision CDI  Discharge Medications:   Medication List  As of 11/12/2011  7:53 AM   TAKE these medications         albuterol 108 (90 BASE) MCG/ACT inhaler   Commonly known as: PROVENTIL HFA;VENTOLIN HFA   Inhale 2 puffs into the lungs every 6 (six) hours as needed for wheezing.      amLODipine-benazepril 10-20 MG per capsule   Commonly known as: LOTREL   Take 1 capsule by mouth daily.      aspirin 81 MG tablet   Take 81 mg by mouth daily.      cloNIDine 0.3 MG tablet   Commonly known as: CATAPRES   Take 1 tablet (0.3 mg total) by mouth 3 (three) times daily.      EASY TOUCH PEN NEEDLES 31G X 6 MM Misc   Generic drug: Insulin Pen Needle   Any brand, use three times a day      fish oil-omega-3 fatty acids 1000 MG capsule   Take 2 g by mouth 2  (two) times daily.      furosemide 40 MG tablet   Commonly known as: LASIX   Take 1.5 tablets (60 mg total) by mouth 2 (two) times daily.      HUMALOG KWIKPEN 100 UNIT/ML injection   Generic drug: insulin lispro   Inject 25-80 Units into the skin 3 (three) times daily before meals. 60 units with breakfast, 25 unit with meal at work, 80 units before evening meal      multivitamin tablet   Take 1 tablet by mouth daily.      oxyCODONE-acetaminophen 5-325  MG per tablet   Commonly known as: PERCOCET   Take 2 tablets by mouth every 8 (eight) hours as needed. For pain      potassium chloride SA 20 MEQ tablet   Commonly known as: K-DUR,KLOR-CON   Take 1 tablet (20 mEq total) by mouth 2 (two) times daily.      pravastatin 80 MG tablet   Commonly known as: PRAVACHOL   Take 80 mg by mouth daily.      vardenafil 20 MG tablet   Commonly known as: LEVITRA   Take 20 mg by mouth daily as needed. For erictile dysfuntion            Disposition: home   Final Dx: re-do microdiskectomy  Discharge Orders    Future Appointments: Provider: Department: Dept Phone: Center:   11/15/2011 2:30 PM Barbaraann Share, MD [facility] [phone] None   12/01/2011 4:15 PM Gaylord Shih, MD [facility] [phone] [facility]     Future Orders Please Complete By Expires   Diet - low sodium heart healthy      Increase activity slowly      Driving Restrictions      Comments:   1week   Lifting restrictions      Comments:   Less than 8 lbs   Call MD for:  temperature >100.4      Call MD for:  persistant nausea and vomiting      Call MD for:  severe uncontrolled pain      Call MD for:  redness, tenderness, or signs of infection (pain, swelling, redness, odor or green/yellow discharge around incision site)      Call MD for:  difficulty breathing, headache or visual disturbances         Follow-up Information    Follow up with Katonya Blecher S, MD. Schedule an appointment as soon as possible  for a visit in 2 weeks.   Contact information:   [address] [place] [address] [phone]           Signed: Tia Alert 11/12/2011, 7:53 AM

## 2011-11-12 NOTE — Progress Notes (Signed)
Utilization review completed. Shaylin Blatt, RN, BSN. 11/12/11  

## 2011-11-15 ENCOUNTER — Institutional Professional Consult (permissible substitution): Payer: BC Managed Care – PPO | Admitting: Pulmonary Disease

## 2011-11-16 ENCOUNTER — Encounter: Payer: Self-pay | Admitting: *Deleted

## 2011-11-22 ENCOUNTER — Other Ambulatory Visit: Payer: Self-pay | Admitting: Endocrinology

## 2011-12-01 ENCOUNTER — Ambulatory Visit: Payer: BC Managed Care – PPO | Admitting: Cardiology

## 2012-01-13 ENCOUNTER — Ambulatory Visit: Payer: BC Managed Care – PPO | Admitting: Cardiology

## 2012-02-03 ENCOUNTER — Other Ambulatory Visit: Payer: Self-pay | Admitting: Endocrinology

## 2012-02-04 ENCOUNTER — Telehealth: Payer: Self-pay | Admitting: Internal Medicine

## 2012-02-04 NOTE — Telephone Encounter (Signed)
Please call to confirm that medications were sent to [facility].  Please call patient.

## 2012-02-07 NOTE — Telephone Encounter (Signed)
.  left message to have patient return my call, need to know which refill should be sent to [facility]

## 2012-02-08 NOTE — Telephone Encounter (Signed)
.  left message to have patient return my call. vm not set up for cell phone yet

## 2012-02-08 NOTE — Telephone Encounter (Signed)
Dee not available;pts wife called back; Levitra is med to be sent to [facility]. pts wife said if any other med needs renewal to call pts wife back and she can verify if needed.

## 2012-02-09 NOTE — Telephone Encounter (Signed)
Phone lines are down, can't call out to pt's wife.  Patient would need an appointment, last seen 06/2011, canceled other appts, I would also need to get the ok from Dr.Letvak due to patient cardiac history. Ok to fill?

## 2012-02-10 NOTE — Telephone Encounter (Signed)
Home number disconnected, cell phone VM not set up, left message on wife's work number that pt would need an appt before refill could be done.

## 2012-02-10 NOTE — Telephone Encounter (Signed)
Please let him know that I think we should discuss this before more meds sent since i was supposed to see him months ago and he was seen in the cardiologist's office with ongoing shortness of breath AND just had sig back surgery He should set up appt soon

## 2012-02-28 ENCOUNTER — Other Ambulatory Visit: Payer: Self-pay | Admitting: Endocrinology

## 2012-03-07 ENCOUNTER — Ambulatory Visit: Payer: BC Managed Care – PPO | Admitting: Cardiology

## 2012-03-13 ENCOUNTER — Ambulatory Visit: Payer: BC Managed Care – PPO | Admitting: Cardiology

## 2012-03-23 ENCOUNTER — Encounter: Payer: Self-pay | Admitting: Gastroenterology

## 2012-05-03 ENCOUNTER — Ambulatory Visit: Payer: BC Managed Care – PPO | Admitting: Cardiology

## 2012-06-03 ENCOUNTER — Encounter (HOSPITAL_COMMUNITY): Payer: Self-pay

## 2012-06-03 ENCOUNTER — Emergency Department (HOSPITAL_COMMUNITY): Payer: BC Managed Care – PPO

## 2012-06-03 ENCOUNTER — Inpatient Hospital Stay (HOSPITAL_COMMUNITY)
Admission: EM | Admit: 2012-06-03 | Discharge: 2012-06-09 | DRG: 541 | Disposition: A | Discharge status: Home / Self Care | Payer: BC Managed Care – PPO | Attending: Internal Medicine | Admitting: Internal Medicine

## 2012-06-03 DIAGNOSIS — Z860100 Personal history of colon polyps, unspecified: Secondary | ICD-10-CM

## 2012-06-03 DIAGNOSIS — J309 Allergic rhinitis, unspecified: Secondary | ICD-10-CM | POA: Diagnosis present

## 2012-06-03 DIAGNOSIS — R0902 Hypoxemia: Secondary | ICD-10-CM

## 2012-06-03 DIAGNOSIS — E7800 Pure hypercholesterolemia, unspecified: Secondary | ICD-10-CM | POA: Diagnosis present

## 2012-06-03 DIAGNOSIS — R0600 Dyspnea, unspecified: Secondary | ICD-10-CM

## 2012-06-03 DIAGNOSIS — J9600 Acute respiratory failure, unspecified whether with hypoxia or hypercapnia: Principal | ICD-10-CM | POA: Diagnosis present

## 2012-06-03 DIAGNOSIS — J449 Chronic obstructive pulmonary disease, unspecified: Secondary | ICD-10-CM

## 2012-06-03 DIAGNOSIS — Z87891 Personal history of nicotine dependence: Secondary | ICD-10-CM

## 2012-06-03 DIAGNOSIS — A4902 Methicillin resistant Staphylococcus aureus infection, unspecified site: Secondary | ICD-10-CM | POA: Diagnosis present

## 2012-06-03 DIAGNOSIS — I509 Heart failure, unspecified: Secondary | ICD-10-CM | POA: Diagnosis present

## 2012-06-03 DIAGNOSIS — I10 Essential (primary) hypertension: Secondary | ICD-10-CM

## 2012-06-03 DIAGNOSIS — I2789 Other specified pulmonary heart diseases: Secondary | ICD-10-CM | POA: Diagnosis present

## 2012-06-03 DIAGNOSIS — E118 Type 2 diabetes mellitus with unspecified complications: Secondary | ICD-10-CM

## 2012-06-03 DIAGNOSIS — I272 Pulmonary hypertension, unspecified: Secondary | ICD-10-CM

## 2012-06-03 DIAGNOSIS — G4733 Obstructive sleep apnea (adult) (pediatric): Secondary | ICD-10-CM | POA: Diagnosis present

## 2012-06-03 DIAGNOSIS — R0609 Other forms of dyspnea: Secondary | ICD-10-CM

## 2012-06-03 DIAGNOSIS — I5032 Chronic diastolic (congestive) heart failure: Secondary | ICD-10-CM | POA: Diagnosis present

## 2012-06-03 DIAGNOSIS — E119 Type 2 diabetes mellitus without complications: Secondary | ICD-10-CM | POA: Diagnosis present

## 2012-06-03 DIAGNOSIS — J440 Chronic obstructive pulmonary disease with acute lower respiratory infection: Secondary | ICD-10-CM | POA: Diagnosis present

## 2012-06-03 DIAGNOSIS — Z7982 Long term (current) use of aspirin: Secondary | ICD-10-CM

## 2012-06-03 DIAGNOSIS — M1710 Unilateral primary osteoarthritis, unspecified knee: Secondary | ICD-10-CM | POA: Diagnosis present

## 2012-06-03 DIAGNOSIS — J209 Acute bronchitis, unspecified: Secondary | ICD-10-CM | POA: Diagnosis present

## 2012-06-03 DIAGNOSIS — Z794 Long term (current) use of insulin: Secondary | ICD-10-CM

## 2012-06-03 DIAGNOSIS — K5900 Constipation, unspecified: Secondary | ICD-10-CM | POA: Diagnosis present

## 2012-06-03 DIAGNOSIS — IMO0002 Reserved for concepts with insufficient information to code with codable children: Secondary | ICD-10-CM | POA: Diagnosis present

## 2012-06-03 DIAGNOSIS — IMO0001 Reserved for inherently not codable concepts without codable children: Secondary | ICD-10-CM | POA: Diagnosis present

## 2012-06-03 DIAGNOSIS — E1165 Type 2 diabetes mellitus with hyperglycemia: Secondary | ICD-10-CM

## 2012-06-03 DIAGNOSIS — J9601 Acute respiratory failure with hypoxia: Secondary | ICD-10-CM

## 2012-06-03 DIAGNOSIS — E785 Hyperlipidemia, unspecified: Secondary | ICD-10-CM | POA: Diagnosis present

## 2012-06-03 DIAGNOSIS — D72829 Elevated white blood cell count, unspecified: Secondary | ICD-10-CM | POA: Diagnosis present

## 2012-06-03 DIAGNOSIS — Z79899 Other long term (current) drug therapy: Secondary | ICD-10-CM

## 2012-06-03 DIAGNOSIS — R110 Nausea: Secondary | ICD-10-CM

## 2012-06-03 DIAGNOSIS — J9611 Chronic respiratory failure with hypoxia: Secondary | ICD-10-CM

## 2012-06-03 DIAGNOSIS — I2510 Atherosclerotic heart disease of native coronary artery without angina pectoris: Secondary | ICD-10-CM | POA: Diagnosis present

## 2012-06-03 LAB — CBC WITH DIFFERENTIAL/PLATELET
Basophils Absolute: 0 10*3/uL (ref 0.0–0.1)
Eosinophils Relative: 0 % (ref 0–5)
Lymphocytes Relative: 10 % — ABNORMAL LOW (ref 12–46)
Lymphs Abs: 0.7 10*3/uL (ref 0.7–4.0)
MCV: 87 fL (ref 78.0–100.0)
Neutro Abs: 5.8 10*3/uL (ref 1.7–7.7)
Neutrophils Relative %: 81 % — ABNORMAL HIGH (ref 43–77)
Platelets: 208 10*3/uL (ref 150–400)
RBC: 5.25 MIL/uL (ref 4.22–5.81)
RDW: 13.4 % (ref 11.5–15.5)
WBC: 7.2 10*3/uL (ref 4.0–10.5)

## 2012-06-03 LAB — CBC
HCT: 47.3 % (ref 39.0–52.0)
MCHC: 34 g/dL (ref 30.0–36.0)
MCV: 86.6 fL (ref 78.0–100.0)
RDW: 13.4 % (ref 11.5–15.5)
WBC: 7.7 10*3/uL (ref 4.0–10.5)

## 2012-06-03 LAB — POCT I-STAT 3, ART BLOOD GAS (G3+)
Bicarbonate: 30.3 mEq/L — ABNORMAL HIGH (ref 20.0–24.0)
TCO2: 32 mmol/L (ref 0–100)
pCO2 arterial: 46.7 mmHg — ABNORMAL HIGH (ref 35.0–45.0)
pH, Arterial: 7.42 (ref 7.350–7.450)
pO2, Arterial: 68 mmHg — ABNORMAL LOW (ref 80.0–100.0)

## 2012-06-03 LAB — CREATININE, SERUM
Creatinine, Ser: 1.06 mg/dL (ref 0.50–1.35)
GFR calc Af Amer: 84 mL/min — ABNORMAL LOW
GFR calc non Af Amer: 72 mL/min — ABNORMAL LOW

## 2012-06-03 LAB — COMPREHENSIVE METABOLIC PANEL
ALT: 38 U/L (ref 0–53)
AST: 30 U/L (ref 0–37)
Alkaline Phosphatase: 68 U/L (ref 39–117)
CO2: 29 mEq/L (ref 19–32)
Calcium: 9 mg/dL (ref 8.4–10.5)
Chloride: 97 mEq/L (ref 96–112)
GFR calc Af Amer: 87 mL/min — ABNORMAL LOW (ref >90)
GFR calc non Af Amer: 75 mL/min — ABNORMAL LOW (ref >90)
Glucose, Bld: 220 mg/dL — ABNORMAL HIGH (ref 70–99)
Potassium: 3.3 mEq/L — ABNORMAL LOW (ref 3.5–5.1)
Sodium: 137 mEq/L (ref 135–145)

## 2012-06-03 LAB — INFLUENZA PANEL BY PCR (TYPE A & B)
H1N1 flu by pcr: NOT DETECTED
Influenza A By PCR: NEGATIVE
Influenza B By PCR: NEGATIVE

## 2012-06-03 LAB — HEMOGLOBIN A1C
Hgb A1c MFr Bld: 7.7 % — ABNORMAL HIGH (ref <5.7)
Mean Plasma Glucose: 174 mg/dL — ABNORMAL HIGH (ref <117)

## 2012-06-03 LAB — RAPID STREP SCREEN (MED CTR MEBANE ONLY): Streptococcus, Group A Screen (Direct): NEGATIVE

## 2012-06-03 MED ORDER — ACETAMINOPHEN 325 MG PO TABS
650.0000 mg | ORAL_TABLET | Freq: Four times a day (QID) | ORAL | Status: DC | PRN
  Administered 2012-06-08: 650 mg via ORAL
  Filled 2012-06-03: qty 2

## 2012-06-03 MED ORDER — ASPIRIN 81 MG PO TABS: 81.0000 mg | ORAL_TABLET | Freq: Every day | ORAL | Status: DC

## 2012-06-03 MED ORDER — INSULIN ASPART 100 UNIT/ML ~~LOC~~ SOLN
4.0000 [IU] | Freq: Three times a day (TID) | SUBCUTANEOUS | Status: DC
  Administered 2012-06-03 – 2012-06-04 (×3): 4 [IU] via SUBCUTANEOUS

## 2012-06-03 MED ORDER — OXYCODONE HCL 5 MG PO TABS
5.0000 mg | ORAL_TABLET | ORAL | Status: DC | PRN
  Administered 2012-06-05 – 2012-06-09 (×9): 5 mg via ORAL
  Filled 2012-06-03 (×10): qty 1

## 2012-06-03 MED ORDER — AMLODIPINE BESYLATE 10 MG PO TABS
10.0000 mg | ORAL_TABLET | Freq: Every day | ORAL | Status: DC
  Administered 2012-06-04 – 2012-06-09 (×6): 10 mg via ORAL
  Filled 2012-06-03 (×7): qty 1

## 2012-06-03 MED ORDER — ALBUTEROL SULFATE (5 MG/ML) 0.5% IN NEBU
2.5000 mg | INHALATION_SOLUTION | RESPIRATORY_TRACT | Status: DC
  Administered 2012-06-03: 2.5 mg via RESPIRATORY_TRACT
  Filled 2012-06-03: qty 20

## 2012-06-03 MED ORDER — MUPIROCIN 2 % EX OINT
1.0000 "application " | TOPICAL_OINTMENT | Freq: Two times a day (BID) | CUTANEOUS | Status: AC
  Administered 2012-06-03 – 2012-06-08 (×10): 1 via NASAL
  Filled 2012-06-03: qty 22

## 2012-06-03 MED ORDER — OMEGA-3 FATTY ACIDS 1000 MG PO CAPS: 2.0000 g | ORAL_CAPSULE | Freq: Two times a day (BID) | ORAL | Status: DC

## 2012-06-03 MED ORDER — ADULT MULTIVITAMIN W/MINERALS CH
1.0000 | ORAL_TABLET | Freq: Every day | ORAL | Status: DC
  Administered 2012-06-04 – 2012-06-09 (×6): 1 via ORAL
  Filled 2012-06-03 (×6): qty 1

## 2012-06-03 MED ORDER — ONDANSETRON HCL 4 MG PO TABS: 4.0000 mg | ORAL_TABLET | Freq: Four times a day (QID) | ORAL | Status: DC | PRN

## 2012-06-03 MED ORDER — SENNOSIDES-DOCUSATE SODIUM 8.6-50 MG PO TABS
1.0000 | ORAL_TABLET | Freq: Every evening | ORAL | Status: DC | PRN
  Filled 2012-06-03 (×3): qty 1

## 2012-06-03 MED ORDER — IOHEXOL 350 MG/ML SOLN
100.0000 mL | Freq: Once | INTRAVENOUS | Status: AC | PRN
  Administered 2012-06-03: 100 mL via INTRAVENOUS

## 2012-06-03 MED ORDER — FUROSEMIDE 10 MG/ML IJ SOLN
80.0000 mg | Freq: Once | INTRAMUSCULAR | Status: AC
  Administered 2012-06-03: 80 mg via INTRAVENOUS
  Filled 2012-06-03: qty 8

## 2012-06-03 MED ORDER — INSULIN GLARGINE 100 UNIT/ML ~~LOC~~ SOLN
30.0000 [IU] | Freq: Every day | SUBCUTANEOUS | Status: DC
  Administered 2012-06-03 – 2012-06-04 (×2): 30 [IU] via SUBCUTANEOUS

## 2012-06-03 MED ORDER — SODIUM CHLORIDE 0.9 % IJ SOLN
3.0000 mL | Freq: Two times a day (BID) | INTRAMUSCULAR | Status: DC
  Administered 2012-06-03 – 2012-06-06 (×8): 3 mL via INTRAVENOUS

## 2012-06-03 MED ORDER — ALBUTEROL SULFATE (5 MG/ML) 0.5% IN NEBU
2.5000 mg | INHALATION_SOLUTION | Freq: Four times a day (QID) | RESPIRATORY_TRACT | Status: DC
  Administered 2012-06-03 – 2012-06-09 (×22): 2.5 mg via RESPIRATORY_TRACT
  Filled 2012-06-03 (×22): qty 0.5

## 2012-06-03 MED ORDER — POTASSIUM CHLORIDE CRYS ER 20 MEQ PO TBCR
20.0000 meq | EXTENDED_RELEASE_TABLET | Freq: Two times a day (BID) | ORAL | Status: DC
  Administered 2012-06-03 – 2012-06-09 (×13): 20 meq via ORAL
  Filled 2012-06-03 (×14): qty 1

## 2012-06-03 MED ORDER — OMEGA-3-ACID ETHYL ESTERS 1 G PO CAPS
2.0000 g | ORAL_CAPSULE | Freq: Two times a day (BID) | ORAL | Status: DC
  Administered 2012-06-03 – 2012-06-09 (×12): 2 g via ORAL
  Filled 2012-06-03 (×13): qty 2

## 2012-06-03 MED ORDER — CLONIDINE HCL 0.3 MG PO TABS
0.3000 mg | ORAL_TABLET | Freq: Three times a day (TID) | ORAL | Status: DC
  Administered 2012-06-03 – 2012-06-09 (×18): 0.3 mg via ORAL
  Filled 2012-06-03 (×20): qty 1

## 2012-06-03 MED ORDER — ONE-DAILY MULTI VITAMINS PO TABS: 1.0000 | ORAL_TABLET | Freq: Every day | ORAL | Status: DC

## 2012-06-03 MED ORDER — ENOXAPARIN SODIUM 40 MG/0.4ML ~~LOC~~ SOLN
40.0000 mg | SUBCUTANEOUS | Status: DC
  Administered 2012-06-03 – 2012-06-08 (×6): 40 mg via SUBCUTANEOUS
  Filled 2012-06-03 (×7): qty 0.4

## 2012-06-03 MED ORDER — BENAZEPRIL HCL 20 MG PO TABS
20.0000 mg | ORAL_TABLET | Freq: Every day | ORAL | Status: DC
  Administered 2012-06-04 – 2012-06-09 (×6): 20 mg via ORAL
  Filled 2012-06-03 (×7): qty 1

## 2012-06-03 MED ORDER — SODIUM CHLORIDE 0.9 % IV SOLN: 250.0000 mL | INTRAVENOUS | Status: DC | PRN

## 2012-06-03 MED ORDER — INSULIN ASPART 100 UNIT/ML ~~LOC~~ SOLN
0.0000 [IU] | Freq: Three times a day (TID) | SUBCUTANEOUS | Status: DC
  Administered 2012-06-03: 11 [IU] via SUBCUTANEOUS
  Administered 2012-06-04: 15 [IU] via SUBCUTANEOUS
  Administered 2012-06-04: 11 [IU] via SUBCUTANEOUS

## 2012-06-03 MED ORDER — METHYLPREDNISOLONE SODIUM SUCC 125 MG IJ SOLR
125.0000 mg | Freq: Once | INTRAMUSCULAR | Status: AC
  Administered 2012-06-03: 125 mg via INTRAVENOUS
  Filled 2012-06-03: qty 2

## 2012-06-03 MED ORDER — CHLORHEXIDINE GLUCONATE CLOTH 2 % EX PADS
6.0000 | MEDICATED_PAD | Freq: Every day | CUTANEOUS | Status: AC
  Administered 2012-06-04 – 2012-06-08 (×5): 6 via TOPICAL

## 2012-06-03 MED ORDER — SIMVASTATIN 5 MG PO TABS
5.0000 mg | ORAL_TABLET | Freq: Every day | ORAL | Status: DC
  Administered 2012-06-03 – 2012-06-08 (×6): 5 mg via ORAL
  Filled 2012-06-03 (×7): qty 1

## 2012-06-03 MED ORDER — METHYLPREDNISOLONE SODIUM SUCC 125 MG IJ SOLR
80.0000 mg | Freq: Two times a day (BID) | INTRAMUSCULAR | Status: DC
  Administered 2012-06-03 – 2012-06-06 (×6): 80 mg via INTRAVENOUS
  Filled 2012-06-03 (×8): qty 1.28

## 2012-06-03 MED ORDER — ZOLPIDEM TARTRATE 5 MG PO TABS: 10.0000 mg | ORAL_TABLET | Freq: Every evening | ORAL | Status: DC | PRN

## 2012-06-03 MED ORDER — SODIUM CHLORIDE 0.9 % IJ SOLN: 3.0000 mL | INTRAMUSCULAR | Status: DC | PRN

## 2012-06-03 MED ORDER — ACETAMINOPHEN 650 MG RE SUPP: 650.0000 mg | Freq: Four times a day (QID) | RECTAL | Status: DC | PRN

## 2012-06-03 MED ORDER — IPRATROPIUM BROMIDE 0.02 % IN SOLN
0.5000 mg | RESPIRATORY_TRACT | Status: DC
  Administered 2012-06-03: 0.5 mg via RESPIRATORY_TRACT
  Filled 2012-06-03: qty 2.5

## 2012-06-03 MED ORDER — AMLODIPINE BESY-BENAZEPRIL HCL 10-20 MG PO CAPS: 1.0000 | ORAL_CAPSULE | Freq: Every day | ORAL | Status: DC

## 2012-06-03 MED ORDER — ASPIRIN EC 81 MG PO TBEC
81.0000 mg | DELAYED_RELEASE_TABLET | Freq: Every day | ORAL | Status: DC
  Administered 2012-06-03 – 2012-06-09 (×7): 81 mg via ORAL
  Filled 2012-06-03 (×7): qty 1

## 2012-06-03 MED ORDER — ONDANSETRON HCL 4 MG/2ML IJ SOLN
4.0000 mg | Freq: Four times a day (QID) | INTRAMUSCULAR | Status: DC | PRN
  Administered 2012-06-03 – 2012-06-04 (×2): 4 mg via INTRAVENOUS
  Filled 2012-06-03 (×2): qty 2

## 2012-06-03 MED ORDER — FUROSEMIDE 40 MG PO TABS
60.0000 mg | ORAL_TABLET | Freq: Two times a day (BID) | ORAL | Status: DC
  Administered 2012-06-03 – 2012-06-06 (×6): 60 mg via ORAL
  Filled 2012-06-03 (×8): qty 1
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
## 2012-06-03 NOTE — ED Notes (Signed)
Pt from MD office, sats low 80s, up to 91% on 4 liters, hx of COPD, SOB began last night, unable to get to bathroom without stopping to breathe

## 2012-06-03 NOTE — ED Notes (Signed)
Family at bedside. 
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
## 2012-06-04 DIAGNOSIS — E1165 Type 2 diabetes mellitus with hyperglycemia: Secondary | ICD-10-CM

## 2012-06-04 DIAGNOSIS — R110 Nausea: Secondary | ICD-10-CM

## 2012-06-04 DIAGNOSIS — J449 Chronic obstructive pulmonary disease, unspecified: Secondary | ICD-10-CM

## 2012-06-04 LAB — CBC
HCT: 44.3 % (ref 39.0–52.0)
Hemoglobin: 15.3 g/dL (ref 13.0–17.0)
MCH: 30.2 pg (ref 26.0–34.0)
MCV: 87.4 fL (ref 78.0–100.0)
RBC: 5.07 MIL/uL (ref 4.22–5.81)

## 2012-06-04 LAB — GLUCOSE, CAPILLARY
Glucose-Capillary: 391 mg/dL — ABNORMAL HIGH (ref 70–99)
Glucose-Capillary: 406 mg/dL — ABNORMAL HIGH (ref 70–99)
Glucose-Capillary: 383 mg/dL — ABNORMAL HIGH (ref 70–99)
Glucose-Capillary: 289 mg/dL — ABNORMAL HIGH (ref 70–99)

## 2012-06-04 LAB — BASIC METABOLIC PANEL
CO2: 28 mEq/L (ref 19–32)
Calcium: 9 mg/dL (ref 8.4–10.5)
Chloride: 96 mEq/L (ref 96–112)
Glucose, Bld: 325 mg/dL — ABNORMAL HIGH (ref 70–99)
Sodium: 135 mEq/L (ref 135–145)

## 2012-06-04 MED ORDER — SENNOSIDES-DOCUSATE SODIUM 8.6-50 MG PO TABS
1.0000 | ORAL_TABLET | Freq: Two times a day (BID) | ORAL | Status: DC
  Administered 2012-06-04 – 2012-06-09 (×5): 1 via ORAL
  Filled 2012-06-04 (×6): qty 1

## 2012-06-04 MED ORDER — INSULIN ASPART 100 UNIT/ML ~~LOC~~ SOLN
0.0000 [IU] | Freq: Every day | SUBCUTANEOUS | Status: DC
  Administered 2012-06-04 – 2012-06-06 (×3): 3 [IU] via SUBCUTANEOUS

## 2012-06-04 MED ORDER — INSULIN ASPART 100 UNIT/ML ~~LOC~~ SOLN
0.0000 [IU] | Freq: Three times a day (TID) | SUBCUTANEOUS | Status: DC
  Administered 2012-06-04 – 2012-06-05 (×2): 20 [IU] via SUBCUTANEOUS
  Administered 2012-06-05: 15 [IU] via SUBCUTANEOUS
  Administered 2012-06-05 – 2012-06-06 (×2): 20 [IU] via SUBCUTANEOUS
  Administered 2012-06-06: 11 [IU] via SUBCUTANEOUS
  Administered 2012-06-06: 20 [IU] via SUBCUTANEOUS
  Administered 2012-06-07 (×2): 15 [IU] via SUBCUTANEOUS
  Administered 2012-06-07: 7 [IU] via SUBCUTANEOUS
  Administered 2012-06-08: 3 [IU] via SUBCUTANEOUS
  Administered 2012-06-08: 2 [IU] via SUBCUTANEOUS
  Administered 2012-06-08: 4 [IU] via SUBCUTANEOUS
  Administered 2012-06-09: 15 [IU] via SUBCUTANEOUS
  Administered 2012-06-09: 4 [IU] via SUBCUTANEOUS

## 2012-06-04 MED ORDER — LACTULOSE 10 GM/15ML PO SOLN
20.0000 g | Freq: Two times a day (BID) | ORAL | Status: DC | PRN
  Administered 2012-06-04: 20 g via ORAL
  Filled 2012-06-04 (×2): qty 30

## 2012-06-04 MED ORDER — MAGNESIUM CITRATE PO SOLN
1.0000 | Freq: Every day | ORAL | Status: DC | PRN
  Administered 2012-06-05: 1 via ORAL
  Filled 2012-06-04: qty 296

## 2012-06-04 MED ORDER — INSULIN GLARGINE 100 UNIT/ML ~~LOC~~ SOLN
30.0000 [IU] | Freq: Every day | SUBCUTANEOUS | Status: DC
  Administered 2012-06-05: 30 [IU] via SUBCUTANEOUS

## 2012-06-04 MED ORDER — INSULIN ASPART 100 UNIT/ML ~~LOC~~ SOLN
6.0000 [IU] | Freq: Three times a day (TID) | SUBCUTANEOUS | Status: DC
  Administered 2012-06-04 – 2012-06-05 (×3): 6 [IU] via SUBCUTANEOUS

## 2012-06-04 NOTE — Progress Notes (Signed)
PULMONARY  / CRITICAL CARE MEDICINE  Name: Dennis Vasquez MRN: [id number] DOB: 24-Oct-1945    LOS: 1  REFERRING PROVIDER:  Dr Hernandez/ [facility]  CHIEF COMPLAINT:  Short of breath  BRIEF PATIENT DESCRIPTION: 66 yo M former smoker, with past hx CAD/ Dr Daleen Squibb, "touch of COPD", remote chlorine RADS event after cleaning garage floor with Clorox and muriatic acid. Quit 80 pk year smoking habit 7 years ago. Dry cough and progressive DOE over past week. Presented to ER dyspneic/ hypoxic. CT neg for PE. Pulmonary consulted for hypoxia.  LINES / TUBES: periph IV  CULTURES: 0  ANTIBIOTICS: 0  SIGNIFICANT EVENTS:    LEVEL OF CARE:  Stepdown PRIMARY SERVICE: [facility] CONSULTANTS:  PCCM CODE STATUS:  Full DIET:  DVT Px: Lovenox GI Px:       REVIEW OF SYSTEMS/ Overnight:   Cough better, still dry. Still noting rapid return of dyspnea at rest if he takes off  NRBM mask. Queasy stomach without pain, vomiting, diarrhea.  Wife in  room, confirms history of distinct change in dyspnea from baseline and emphasizes his complaint was partly of "bloating".  INTERVAL HISTORY:   VITAL SIGNS: Temp:  [97 F (36.1 C)-98.2 F (36.8 C)] 97.4 F (36.3 C) (12/01 0827) Pulse Rate:  [70-97] 93  (12/01 1100) Resp:  [13-22] 17  (12/01 1100) BP: (127-157)/(75-90) 149/75 mmHg (12/01 1100) SpO2:  [88 %-98 %] 98 % (12/01 1100) FiO2 (%):  [100 %] 100 % (12/01 0726) Weight:  [138 kg (304 lb 3.8 oz)-138.6 kg (305 lb 8.9 oz)] 138 kg (304 lb 3.8 oz) (12/01 0533)  PHYSICAL EXAMINATION: General:  Obese, alert, cooperative Nodes: not enlarged Neuro:  No focal deficit, oriented w/ clear speech HEENT:  Mucosa hydrated, no nasal congestion Neck:  No JVD or stridor Cardiovascular:  RRR, no m/g/r. Capillary fill normal. Trace pitting edema feet Lungs:  Distant with few scattered wheezes. Sat 98% on NRBM, unlabored. Abdomen:  Firmly obese, nontender, decreased bowel sounds Musculoskeletal:  Grossly normal Skin:   Early stasis dermatitis at calves   Lab 06/04/12 0311 06/03/12 1606 06/03/12 1004  NA 135 -- 137  K 3.9 -- 3.3*  CL 96 -- 97  CO2 28 -- 29  BUN 20 -- 13  CREATININE 1.01 1.06 1.03  GLUCOSE 325* -- 220*    Lab 06/04/12 0311 06/03/12 1606 06/03/12 1004  HGB 15.3 16.1 15.7  HCT 44.3 47.3 45.7  WBC 10.4 7.7 7.2  PLT 203 221 208   Ct Angio Chest Pe W/cm &/or Wo Cm  06/03/2012  *RADIOLOGY REPORT*  Clinical Data: Hypoxemia with oxygen saturation in the low 80s, up to 91% and 4 liters of oxygen.  Shortness of breath which began last night.  Current history of COPD, coronary artery disease, and diabetes.  CT ANGIOGRAPHY CHEST  Technique:  Multidetector CT imaging of the chest using the standard protocol during bolus administration of intravenous contrast. Multiplanar reconstructed images including MIPs were obtained and reviewed to evaluate the vascular anatomy.  Contrast: OMNIPAQUE IOHEXOL 350 MG/ML SOLN  Comparison: None.  Findings: Contrast opacification of the pulmonary arteries is good. Respiratory motion blurred images of the lung bases.  Overall, the study is of good diagnostic quality.  No filling defects within either main pulmonary artery or their branches in either lung to suggest pulmonary embolism.  Heart size normal.  No pericardial effusion.  Mild to moderate three-vessel coronary atherosclerosis.  Mild atherosclerosis involving the thoracic and upper abdominal aorta and the  visualized branches without aneurysm or dissection.  Emphysematous changes in the upper lobes.  Pulmonary parenchyma clear without localized airspace consolidation, interstitial disease, or parenchymal nodules or masses.  No pleural effusions. Central airways patent with moderate bronchial wall thickening.  Scattered normal-sized mediastinal, hilar, and axillary lymph nodes; no significant lymphadenopathy.  Visualized thyroid gland unremarkable.  Visualized upper abdomen unremarkable.  Bone window images  demonstrate thoracic spondylosis.  IMPRESSION:  1.  No evidence of pulmonary embolism. 2.  COPD/emphysema.  No acute cardiopulmonary disease. 3.  Mild to moderate three-vessel coronary atherosclerosis.   Original Report Authenticated By: Hulan Saas, M.D.    Dg Chest Port 1 View  06/03/2012  *RADIOLOGY REPORT*  Clinical Data: Shortness of breath  PORTABLE CHEST - 1 VIEW  Comparison: Chest radiograph 04/19/2011 and chest CT  11/06/2007  Findings: Cardiac leads project over the chest.  Heart and mediastinal contours are stable.  An opacity in the left lung base obscures the left hemidiaphragm.  Right lung appears clear. Emphysematous changes at the lung apices.  Negative for pneumothorax.  IMPRESSION:  1.  Left basilar opacity.  This could reflect atelectasis or scarring, and/or airspace disease. 2.  Emphysema.   Original Report Authenticated By: Britta Mccreedy, M.D.     ASSESSMENT / PLAN: # acute respiratory failure with hypoxia He has probably been marginally oxygenated due to COPD, obesity with hypoventilation and basilar atelectasis, uncertain component of old bronchiolitis from 1997 chlorine exposure. Acute event is likely a viral bronchiolitis caught from wife, and hopefully responsive to steroids and bronchodilators. Mechanism is more likely V/Q mismatch than vascular shunt. Although weather is cold enough for risky exposure, pattern doesn't look like CO poisoning, no hx of liver disease to cause shunting, and no acute cardiac event, obvious parenchymal pneumonia, or acute cardiac event to create an acute cardiac shunt. Don't suspect acute abdomen or UTI now which might cause early sepsis syndrome or ARDS. Anatomy favors OSA, but he has not had comments from wife. May need BIPAP sooner rather than later if this progresses.  #COPD- PFT 11/13/07- FEV1 2.81/ 75%, FEV1/FVC 0.68, DLCO 61%, little response to bronchodilator This favors suspicion that his diffusion capacity was low at baseline, needing  only an acute bronchiolitis to tip him over.  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  Will need adjustment per primary team  #Nausea Hx chronic constipation. Sense of queasy anorexia and abd bloating has been part of this course.Recognize impact of abdominal crowding on ventilation. -Consider KUB  Suggest- Continue nebs and IV steroids 1 more day, then reconsider.  Terisa Starr, MD Pulmonary and Critical Care Medicine [facility] Daytime cell [phone] Pager: [phone]  06/04/2012, 12:26 PM

## 2012-06-04 NOTE — Progress Notes (Signed)
[facility] Progress Note [place] TEAM 1 - Stepdown/ICU TEAM   Dennis Vasquez AVW:[id number] DOB: 23-Nov-1945 DOA: 06/03/2012 PCP: Tillman Abide, MD  Brief narrative: 66 y/o man who started complaining of SOB on Tuesday. His wife has had strep throat, so they thought he could possibly have the same. On Tuesday he started developing a productive cough. No fevers, chills, CP. This has progressed to the point where he has had to stop work this past week. He went to the walk-in clinic at his PCPs office and had extreme difficulty even getting from the car to the front desk. His sats were noted to be in the mid 80s so he was sent to the ED for further evaluation. He quit smoking 7 years ago, has central heat at his house, has not been in enclosed spaces with running cars or purchased any new space/stove heaters. Had a cath in 2/13 that showed mild diffuse CAD and an EF of 55-65%. Has required a NRB since being in the ED with sats in the 92-93 range. PO2 is 68 on 100% NRB. CT chest is essentially unremarkable. BNP 47. We have been asked to admit him for further evaluation and management.   Assessment/Plan:  Acute hypoxic respiratory failure CT Chest without evidence for PE, PNA, edema or interstitial disease - likely a viral bronchiolitis - cont steroids and O2 support  HTN Poorly controlled at present - adjust tx and follow   Obesity   COPD diagnosed 2009 PFT 11/13/07- FEV1 2.81/ 75%, FEV1/FVC 0.68, DLCO 61%, little response to bronchodilator - given that pt did not respond to bronchodilators, will minimize their use now   HLD  DM Poorly controlled in setting of steroid use - adjust tx plan and follow  CAD - Non obstructive on cath Feb 2013 No chest pain   Chronic constipation Stimulate bowels to maximize abdom compliance  MRSA screen + On contact isolation  Code Status: FULL Family Communication: spoke w/ pt and wife at bedside Disposition Plan: SDU due to high level O2  requirement  Consultants: Pulomonary  Procedures: None  Antibiotics: none  DVT prophylaxis: lovenox  HPI/Subjective: Pt feels that his breathing has improved slightly.  Denies chest pain, f/c, n/v.  C/o severe constipation.     Objective: Blood pressure 147/89, pulse 90, temperature 98.1 F (36.7 C), temperature source Oral, resp. rate 15, height 6\' 3"  (1.905 m), weight 138 kg (304 lb 3.8 oz), SpO2 100.00%.  Intake/Output Summary (Last 24 hours) at 06/04/12 1429 Last data filed at 06/04/12 1242  Gross per 24 hour  Intake    922 ml  Output   2775 ml  Net  -1853 ml     Exam: General: mild resp distress - requiring O2 via NRB Lungs: Clear to auscultation bilaterally without wheezes or crackles - poor air movement th/o  Cardiovascular: distant heart sounds - regular rate and rhythm without murmur gallop or rub normal S1 and S2 Abdomen: obese, mildly tender diffusely, protuberent, soft, bowel sounds positive, no rebound, no ascites, no appreciable mass Extremities: No significant cyanosis, clubbing, or edema bilateral lower extremities  Data Reviewed: Basic Metabolic Panel:  Lab 06/04/12 8295 06/03/12 1606 06/03/12 1004  NA 135 -- 137  K 3.9 -- 3.3*  CL 96 -- 97  CO2 28 -- 29  GLUCOSE 325* -- 220*  BUN 20 -- 13  CREATININE 1.01 1.06 1.03  CALCIUM 9.0 -- 9.0  MG -- -- --  PHOS -- -- --   Liver Function  Tests:  Lab 06/03/12 1004  AST 30  ALT 38  ALKPHOS 68  BILITOT 0.5  PROT 7.4  ALBUMIN 3.9   CBC:  Lab 06/04/12 0311 06/03/12 1606 06/03/12 1004  WBC 10.4 7.7 7.2  NEUTROABS -- -- 5.8  HGB 15.3 16.1 15.7  HCT 44.3 47.3 45.7  MCV 87.4 86.6 87.0  PLT 203 221 208   Cardiac Enzymes:  Lab 06/04/12 0312 06/03/12 2110 06/03/12 1606 06/03/12 1005  CKTOTAL -- -- -- --  CKMB -- -- -- --  CKMBINDEX -- -- -- --  TROPONINI <0.30 <0.30 <0.30 <0.30   BNP (last 3 results)  Basename 06/03/12 1005 09/28/11 1600  PROBNP 47.0 29.0   CBG:  Lab 06/04/12 1148  06/04/12 1143 06/04/12 0743 06/03/12 2111 06/03/12 1703  GLUCAP 391* 406* 301* 284* 309*    Recent Results (from the past 240 hour(s))  MRSA PCR SCREENING     Status: Abnormal   Collection Time   06/03/12  3:23 PM      Component Value Range Status Comment   MRSA by PCR POSITIVE (*) NEGATIVE Final   RAPID STREP SCREEN     Status: Normal   Collection Time   06/03/12  4:12 PM      Component Value Range Status Comment   Streptococcus, Group A Screen (Direct) NEGATIVE  NEGATIVE Final      Studies:  Recent x-ray studies have been reviewed in detail by the Attending Physician  Scheduled Meds:  Reviewed in detail by the Attending Physician   Lonia Blood, MD [facility] Office  [phone] Pager [phone]  On-Call/Text Page:      [email or web address]      password [email or web address]  If 7PM-7AM, please contact night-coverage [email or web address] Password [email or web address] 06/04/2012, 2:29 PM   LOS: 1 day

## 2012-06-05 DIAGNOSIS — J309 Allergic rhinitis, unspecified: Secondary | ICD-10-CM

## 2012-06-05 LAB — GLUCOSE, CAPILLARY: Glucose-Capillary: 332 mg/dL — ABNORMAL HIGH (ref 70–99)

## 2012-06-05 MED ORDER — INSULIN GLARGINE 100 UNIT/ML ~~LOC~~ SOLN
45.0000 [IU] | Freq: Every day | SUBCUTANEOUS | Status: DC
  Administered 2012-06-06: 45 [IU] via SUBCUTANEOUS

## 2012-06-05 MED ORDER — INSULIN GLARGINE 100 UNIT/ML ~~LOC~~ SOLN: 40.0000 [IU] | Freq: Every day | SUBCUTANEOUS | Status: DC

## 2012-06-05 MED ORDER — SORBITOL 70 % SOLN
960.0000 mL | TOPICAL_OIL | Freq: Once | ORAL | Status: DC
  Filled 2012-06-05: qty 240

## 2012-06-05 MED ORDER — POLYETHYLENE GLYCOL 3350 17 G PO PACK
17.0000 g | PACK | Freq: Two times a day (BID) | ORAL | Status: DC
  Administered 2012-06-05: 17 g via ORAL
  Filled 2012-06-05 (×10): qty 1

## 2012-06-05 MED ORDER — INSULIN ASPART 100 UNIT/ML ~~LOC~~ SOLN
8.0000 [IU] | Freq: Three times a day (TID) | SUBCUTANEOUS | Status: DC
  Administered 2012-06-05 – 2012-06-06 (×3): 8 [IU] via SUBCUTANEOUS

## 2012-06-05 MED ORDER — MAGNESIUM CITRATE PO SOLN
1.0000 | Freq: Once | ORAL | Status: DC
  Filled 2012-06-05: qty 296

## 2012-06-05 MED ORDER — INSULIN GLARGINE 100 UNIT/ML ~~LOC~~ SOLN
10.0000 [IU] | Freq: Once | SUBCUTANEOUS | Status: AC
  Administered 2012-06-05: 10 [IU] via SUBCUTANEOUS

## 2012-06-05 NOTE — Progress Notes (Addendum)
PULMONARY  / CRITICAL CARE MEDICINE  Name: Dennis Vasquez MRN: [id number] DOB: [birth year]    LOS: 2  REFERRING PROVIDER:  Dr Hernandez/ [facility]  CHIEF COMPLAINT:  Short of breath  BRIEF PATIENT DESCRIPTION: 66 yo M former smoker, with past hx CAD/ Dr Daleen Squibb, "touch of COPD", remote chlorine RADS event after cleaning garage floor with Clorox and muriatic acid. Quit 80 pk year smoking habit 7 years ago. Dry cough and progressive DOE over past week. Presented to ER dyspneic/ hypoxic. CT neg for PE. Pulmonary consulted for hypoxia.  LINES / TUBES: periph IV  CULTURES:  ANTIBIOTICS:  SIGNIFICANT EVENTS:  No distress   LEVEL OF CARE:  Stepdown PRIMARY SERVICE: [facility] CONSULTANTS:  PCCM CODE STATUS:  Full DIET:  DVT Px: Lovenox GI Px:    Labs  Radiology CT a- reviewed- No PE, Some apical emphysema, bronchial thickening, and CAD. ECHO-10/11/11- EF 60%, diastolic dysfunction, RV and RA mildly dilated  INTERVAL HISTORY: Patient reports improvement, clearing secretions.  O2 req's down to 4L from VM.  VITAL SIGNS: Temp:  [98.1 F (36.7 C)-98.6 F (37 C)] 98.1 F (36.7 C) (12/02 1154) Pulse Rate:  [70-107] 107  (12/02 1154) Resp:  [11-20] 18  (12/02 1154) BP: (121-180)/(69-98) 163/78 mmHg (12/02 1154) SpO2:  [88 %-100 %] 91 % (12/02 1154) FiO2 (%):  [40 %-100 %] 40 % (12/02 0933)  PHYSICAL EXAMINATION: General:  Obese, alert, cooperative Neuro:  No focal deficit, oriented w/ clear speech HEENT:  no nasal congestion Neck:  No JVD or stridor Cardiovascular:  RRR, no m/g/r. Capillary fill normal. Trace pitting edema feet Lungs:  resp's even/non-labored, lungs bilaterally distant but clear Abdomen:  Firmly obese, nontender, decreased bowel sounds Musculoskeletal:  Grossly normal Skin:  Early stasis dermatitis at calves   Lab 06/04/12 0311 06/03/12 1606 06/03/12 1004  NA 135 -- 137  K 3.9 -- 3.3*  CL 96 -- 97  CO2 28 -- 29  BUN 20 -- 13  CREATININE 1.01 1.06 1.03  GLUCOSE 325*  -- 220*    Lab 06/04/12 0311 06/03/12 1606 06/03/12 1004  HGB 15.3 16.1 15.7  HCT 44.3 47.3 45.7  WBC 10.4 7.7 7.2  PLT 203 221 208   No results found.  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  Acute event is likely a viral bronchiolitis caught from wife, and hopefully responsive to steroids and bronchodilators.   Anatomy favors OSA - wife endorses that he snores and has periods of apnea.   COPD- PFT 11/13/07- FEV1 2.81/ 75%, FEV1/FVC 0.68, DLCO 61%, little response to bronchodilator This favors suspicion that his diffusion capacity was low at baseline, needing only an acute bronchiolitis to tip him over.  Presumed OSA  Morbid obesity, snoring and periods of pauses per wife.  Endorses lower extremity and abdominal swelling --?some element of PAH  PLAN: - IV steroids - monitor CBG's - bronchodilator nebs.  - will need ambulatory desaturation test prior to d/c to determine if O2 needed - outpatient follow up with sleep / pulmonary MD for eval of COPD, sleep apnea needs.   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    And edited in full  Mcarthur Rossetti. Tyson Alias, MD, FACP Pgr: [phone] [facility]

## 2012-06-05 NOTE — Progress Notes (Signed)
[facility] Progress Note [name] TEAM 1 - Stepdown/ICU TEAM   Dennis Vasquez GEX:[id number] DOB: May 05, 1946 DOA: 06/03/2012 PCP: Tillman Abide, MD  Brief narrative: 66 y/o man who started complaining of SOB on Tuesday. His wife has had strep throat, so they thought he could possibly have the same. On Tuesday he started developing a productive cough. No fevers, chills, CP. This has progressed to the point where he has had to stop work this past week. He went to the walk-in clinic at his PCPs office and had extreme difficulty even getting from the car to the front desk. His sats were noted to be in the mid 80s so he was sent to the ED for further evaluation. He quit smoking 7 years ago, has central heat at his house, has not been in enclosed spaces with running cars or purchased any new space/stove heaters. Had a cath in 2/13 that showed mild diffuse CAD and an EF of 55-65%. Has required a NRB since being in the ED with sats in the 92-93 range. PO2 is 68 on 100% NRB. CT chest is essentially unremarkable. BNP 47. We have been asked to admit him for further evaluation and management.   Assessment/Plan:  Acute hypoxic respiratory failure *CT Chest without evidence for PE, PNA, edema or interstitial disease - likely a viral bronchiolitis  *Continues to have exertional desaturations *cont steroids and O2 support  HTN *Better controlled - note early a.m. hypertension prior to receiving usual a.m. medication *Continue Norvasc, Lasix and Lotensin  Obesity   COPD diagnosed 2009 *PFT 11/13/07- FEV1 2.81/ 75%, FEV1/FVC 0.68, DLCO 61%, little response to bronchodilator  *given that pt did not respond to bronchodilators use was minimized beginning 06/04/2012  HLD  DM *Poorly controlled in setting of steroid use - adjust tx plan and follow *Will increase from home Lantus dose 30 units to 40 units daily  CAD - Non obstructive on cath Feb 2013 No chest pain   Chronic  constipation *Stimulate bowels to maximize abdom compliance - no response to medications 06/04/2012 therefore mag citrate added today  MRSA screen + On contact isolation  Code Status: FULL Family Communication: spoke w/ pt and wife at bedside Disposition Plan: SDU due to high level O2 requirement  Consultants: Pulomonary  Procedures: None  Antibiotics: none  DVT prophylaxis: lovenox  HPI/Subjective: Pt feels that his breathing has improved slightly but also endorses desaturations with attempts to ambulate.  Denies chest pain, f/c, n/v.  C/o severe constipation.     Objective: Blood pressure 149/78, pulse 102, temperature 98.6 F (37 C), temperature source Oral, resp. rate 15, height 6\' 3"  (1.905 m), weight 138 kg (304 lb 3.8 oz), SpO2 95.00%.  Intake/Output Summary (Last 24 hours) at 06/05/12 1128 Last data filed at 06/05/12 1001  Gross per 24 hour  Intake    483 ml  Output   2725 ml  Net  -2242 ml     Exam: General: mild resp distress - requiring O2 via NRB Lungs: Clear to auscultation bilaterally without wheezes or crackles - poor air movement th/o  Cardiovascular: distant heart sounds - regular rate and rhythm without murmur gallop or rub normal S1 and S2 Abdomen: obese, mildly tender diffusely, protuberent, soft, bowel sounds positive, no rebound, no ascites, no appreciable mass Musculoskeletal: No significant cyanosis, clubbing of bilateral lower extremities Neurological: Patient is alert and oriented x3, moves all extremities x4, exam otherwise non focal  Data Reviewed: Basic Metabolic Panel:  Lab 06/04/12 0102 06/03/12  1606 06/03/12 1004  NA 135 -- 137  K 3.9 -- 3.3*  CL 96 -- 97  CO2 28 -- 29  GLUCOSE 325* -- 220*  BUN 20 -- 13  CREATININE 1.01 1.06 1.03  CALCIUM 9.0 -- 9.0  MG -- -- --  PHOS -- -- --   Liver Function Tests:  Lab 06/03/12 1004  AST 30  ALT 38  ALKPHOS 68  BILITOT 0.5  PROT 7.4  ALBUMIN 3.9   CBC:  Lab 06/04/12 0311  06/03/12 1606 06/03/12 1004  WBC 10.4 7.7 7.2  NEUTROABS -- -- 5.8  HGB 15.3 16.1 15.7  HCT 44.3 47.3 45.7  MCV 87.4 86.6 87.0  PLT 203 221 208   Cardiac Enzymes:  Lab 06/04/12 0312 06/03/12 2110 06/03/12 1606 06/03/12 1005  CKTOTAL -- -- -- --  CKMB -- -- -- --  CKMBINDEX -- -- -- --  TROPONINI <0.30 <0.30 <0.30 <0.30   BNP (last 3 results)  Basename 06/03/12 1005 09/28/11 1600  PROBNP 47.0 29.0   CBG:  Lab 06/05/12 0735 06/04/12 2110 06/04/12 1703 06/04/12 1659 06/04/12 1148  GLUCAP 353* 289* 383* 407* 391*    Recent Results (from the past 240 hour(s))  MRSA PCR SCREENING     Status: Abnormal   Collection Time   06/03/12  3:23 PM      Component Value Range Status Comment   MRSA by PCR POSITIVE (*) NEGATIVE Final   RAPID STREP SCREEN     Status: Normal   Collection Time   06/03/12  4:12 PM      Component Value Range Status Comment   Streptococcus, Group A Screen (Direct) NEGATIVE  NEGATIVE Final      Studies:  Recent x-ray studies have been reviewed in detail by the Attending Physician  Scheduled Meds:  Reviewed in detail by the Attending Physician   Junious Silk, ANP [facility] Office  [phone] Pager [phone]  On-Call/Text Page:      [email or web address]      password [email or web address]  If 7PM-7AM, please contact night-coverage [email or web address] Password [email or web address] 06/05/2012, 11:28 AM   LOS: 2 days   I have personally examined this patient and reviewed the entire database. I have reviewed the above note, made any necessary editorial changes, and agree with its content.  Lonia Blood, MD [facility]

## 2012-06-05 NOTE — Progress Notes (Signed)
Inpatient Diabetes Program Recommendations  AACE/ADA: New Consensus Statement on Inpatient Glycemic Control (2013)  Target Ranges:  Prepandial:   less than 140 mg/dL      Peak postprandial:   less than 180 mg/dL (1-2 hours)      Critically ill patients:  140 - 180 mg/dL  Results for [name], Dennis Vasquez (MRN [id number]) as of 06/05/2012 11:52  Ref. Range 06/04/2012 11:48 06/04/2012 16:59 06/04/2012 17:03 06/04/2012 21:10 06/05/2012 07:35  Glucose-Capillary Latest Range: 70-99 mg/dL 536 (H) 644 (H) 034 (H) 289 (H) 353 (H)   Inpatient Diabetes Program Recommendations Insulin - Basal: agree with Lantus increase during steroid therapy  Insulin - Meal Coverage: Increase Novolog with meals to 10 units TID (home doses much higher) Will follow. Thank you  Piedad Climes [facility] Inpatient Diabetes Coordinator [phone]

## 2012-06-05 NOTE — Progress Notes (Deleted)
Nurse was unable to get blood return from PICC line (lumen 1 nor lumen 2).  Line previously received TPA.

## 2012-06-06 ENCOUNTER — Inpatient Hospital Stay (HOSPITAL_COMMUNITY): Payer: BC Managed Care – PPO

## 2012-06-06 DIAGNOSIS — I10 Essential (primary) hypertension: Secondary | ICD-10-CM

## 2012-06-06 DIAGNOSIS — R609 Edema, unspecified: Secondary | ICD-10-CM

## 2012-06-06 DIAGNOSIS — I2510 Atherosclerotic heart disease of native coronary artery without angina pectoris: Secondary | ICD-10-CM

## 2012-06-06 LAB — BASIC METABOLIC PANEL
BUN: 38 mg/dL — ABNORMAL HIGH (ref 6–23)
Calcium: 8.8 mg/dL (ref 8.4–10.5)
GFR calc Af Amer: 83 mL/min — ABNORMAL LOW (ref >90)
GFR calc non Af Amer: 71 mL/min — ABNORMAL LOW (ref >90)
Glucose, Bld: 375 mg/dL — ABNORMAL HIGH (ref 70–99)
Potassium: 5.5 mEq/L — ABNORMAL HIGH (ref 3.5–5.1)
Sodium: 136 mEq/L (ref 135–145)

## 2012-06-06 LAB — CBC
Hemoglobin: 15.5 g/dL (ref 13.0–17.0)
MCH: 29.8 pg (ref 26.0–34.0)
MCHC: 33.4 g/dL (ref 30.0–36.0)
RDW: 13.7 % (ref 11.5–15.5)

## 2012-06-06 LAB — GLUCOSE, CAPILLARY
Glucose-Capillary: 394 mg/dL — ABNORMAL HIGH (ref 70–99)
Glucose-Capillary: 266 mg/dL — ABNORMAL HIGH (ref 70–99)

## 2012-06-06 MED ORDER — FUROSEMIDE 20 MG PO TABS
20.0000 mg | ORAL_TABLET | Freq: Two times a day (BID) | ORAL | Status: DC
  Administered 2012-06-06 – 2012-06-07 (×2): 20 mg via ORAL
  Filled 2012-06-06 (×4): qty 1

## 2012-06-06 MED ORDER — INSULIN GLARGINE 100 UNIT/ML ~~LOC~~ SOLN
15.0000 [IU] | Freq: Once | SUBCUTANEOUS | Status: AC
  Administered 2012-06-06: 15 [IU] via SUBCUTANEOUS

## 2012-06-06 MED ORDER — INSULIN GLARGINE 100 UNIT/ML ~~LOC~~ SOLN: 55.0000 [IU] | Freq: Every day | SUBCUTANEOUS | Status: DC

## 2012-06-06 MED ORDER — INSULIN ASPART 100 UNIT/ML ~~LOC~~ SOLN
12.0000 [IU] | Freq: Three times a day (TID) | SUBCUTANEOUS | Status: DC
  Administered 2012-06-06 – 2012-06-07 (×2): 12 [IU] via SUBCUTANEOUS

## 2012-06-06 MED ORDER — INSULIN GLARGINE 100 UNIT/ML ~~LOC~~ SOLN
50.0000 [IU] | Freq: Every day | SUBCUTANEOUS | Status: DC
  Administered 2012-06-07: 50 [IU] via SUBCUTANEOUS

## 2012-06-06 MED ORDER — METHYLPREDNISOLONE SODIUM SUCC 125 MG IJ SOLR
60.0000 mg | Freq: Two times a day (BID) | INTRAMUSCULAR | Status: DC
  Administered 2012-06-06 – 2012-06-07 (×2): 60 mg via INTRAVENOUS
  Filled 2012-06-06 (×4): qty 0.96

## 2012-06-06 NOTE — Progress Notes (Signed)
[facility] Progress Note [place] TEAM 1 - Stepdown/ICU TEAM   Dennis Vasquez VQQ:[id number] DOB: 11/05/1945 DOA: 06/03/2012 PCP: Tillman Abide, MD  Brief narrative: 66 y/o man who started complaining of SOB on Tuesday. His wife has had strep throat, so they thought he could possibly have the same. On Tuesday he started developing a productive cough. No fevers, chills, CP. This has progressed to the point where he has had to stop work this past week. He went to the walk-in clinic at his PCPs office and had extreme difficulty even getting from the car to the front desk. His sats were noted to be in the mid 80s so he was sent to the ED for further evaluation. He quit smoking 7 years ago, has central heat at his house, has not been in enclosed spaces with running cars or purchased any new space/stove heaters. Had a cath in 2/13 that showed mild diffuse CAD and an EF of 55-65%. Has required a NRB since being in the ED with sats in the 92-93 range. PO2 is 68 on 100% NRB. CT chest is essentially unremarkable. BNP 47. We have been asked to admit him for further evaluation and management.   Assessment/Plan:  Acute hypoxic respiratory failure *CT Chest without evidence for PE, PNA, edema or interstitial disease - likely a viral bronchiolitis  *Continues to have exertional desaturations *cont steroids (pulmonary weaning somewhat) and O2 support *Seems euvolemic so pulmonary medicine decreasing Lasix dosage  HTN *Better controlled - note early a.m. hypertension prior to receiving usual a.m. medication *Continue Norvasc, Lasix and Lotensin  Obesity/presumed sleep apnea *Attempt was made to utilize CPAP overnight but mask was not big enough although patient reports did initially follow sleep until mask became loose on face. *Pulmonary medicine notes will need a formal outpatient polysomnogram   COPD diagnosed 2009 *PFT 11/13/07- FEV1 2.81/ 75%, FEV1/FVC 0.68, DLCO 61%, little response to  bronchodilator  *given that pt did not respond to bronchodilators use was minimized beginning 06/04/2012  HLD  DM *Poorly controlled in setting of steroid use - adjust tx plan and follow *Was increased from home Lantus dose 30 units to 40 units daily on 06/05/2012-we'll further increase to 50 dosage since CBGs are persistently greater than 300 due to steroids increaseing mealtime novolog from 12 to 15 TID  CAD - Non obstructive on cath Feb 2013 No chest pain   Chronic constipation *Now having multiple bowel movements with the addition of multiple laxative agents as well as prophylactic agents  MRSA screen + On contact isolation  Code Status: FULL Family Communication: spoke w/ pt and wife at bedside Disposition Plan: SDU due to high level O2 requirement  Consultants: Pulmonary  Procedures: None  Antibiotics: none  DVT prophylaxis: lovenox  HPI/Subjective: States no real change in breathing. Still notes oxygen levels decreasing and increasing fatigue with exertion. States has had at least 3 bowel movements since receiving magnesium citrate and MiraLax yesterday. States abdomen feels much less distended   Objective: Blood pressure 141/69, pulse 86, temperature 98 F (36.7 C), temperature source Tympanic, resp. rate 15, height 6\' 3"  (1.905 m), weight 138 kg (304 lb 3.8 oz), SpO2 93.00%.  Intake/Output Summary (Last 24 hours) at 06/06/12 1309 Last data filed at 06/06/12 0900  Gross per 24 hour  Intake    240 ml  Output   1500 ml  Net  -1260 ml     Exam: General: mild resp distress - requiring O2 via NRB Lungs: Clear to  auscultation bilaterally without wheezes or crackles - poor air movement th/o  Cardiovascular: distant heart sounds - regular rate and rhythm without murmur gallop or rub normal S1 and S2 Abdomen: obese, non tender, soft, bowel sounds positive, no rebound, no ascites, no appreciable mass Musculoskeletal: No significant cyanosis, clubbing of  bilateral lower extremities Neurological: Patient is alert and oriented x3, moves all extremities x4, exam otherwise non focal  Data Reviewed: Basic Metabolic Panel:  Lab 06/06/12 1610 06/04/12 0311 06/03/12 1606 06/03/12 1004  NA 136 135 -- 137  K 5.5* 3.9 -- 3.3*  CL 95* 96 -- 97  CO2 30 28 -- 29  GLUCOSE 375* 325* -- 220*  BUN 38* 20 -- 13  CREATININE 1.07 1.01 1.06 1.03  CALCIUM 8.8 9.0 -- 9.0  MG -- -- -- --  PHOS -- -- -- --   Liver Function Tests:  Lab 06/03/12 1004  AST 30  ALT 38  ALKPHOS 68  BILITOT 0.5  PROT 7.4  ALBUMIN 3.9   CBC:  Lab 06/06/12 0415 06/04/12 0311 06/03/12 1606 06/03/12 1004  WBC 20.6* 10.4 7.7 7.2  NEUTROABS -- -- -- 5.8  HGB 15.5 15.3 16.1 15.7  HCT 46.4 44.3 47.3 45.7  MCV 89.2 87.4 86.6 87.0  PLT 246 203 221 208   Cardiac Enzymes:  Lab 06/04/12 0312 06/03/12 2110 06/03/12 1606 06/03/12 1005  CKTOTAL -- -- -- --  CKMB -- -- -- --  CKMBINDEX -- -- -- --  TROPONINI <0.30 <0.30 <0.30 <0.30   BNP (last 3 results)  Basename 06/03/12 1005 09/28/11 1600  PROBNP 47.0 29.0   CBG:  Lab 06/06/12 1213 06/06/12 0818 06/05/12 2218 06/05/12 1638 06/05/12 1152  GLUCAP 355* 394* 277* 332* 367*    Recent Results (from the past 240 hour(s))  MRSA PCR SCREENING     Status: Abnormal   Collection Time   06/03/12  3:23 PM      Component Value Range Status Comment   MRSA by PCR POSITIVE (*) NEGATIVE Final   RAPID STREP SCREEN     Status: Normal   Collection Time   06/03/12  4:12 PM      Component Value Range Status Comment   Streptococcus, Group A Screen (Direct) NEGATIVE  NEGATIVE Final      Studies:  Recent x-ray studies have been reviewed in detail by the Attending Physician  Scheduled Meds:  Reviewed in detail by the Attending Physician   Dennis Vasquez, ANP [facility] Office  [phone] Pager [phone]  On-Call/Text Page:      [email or web address]      password [email or web address]  If 7PM-7AM, please contact  night-coverage [email or web address] Password [facility] 06/06/2012, 1:09 PM  I have examined the patient and reviewed the chart. I agree with the above note which I have modified.   Dennis Cantor, MD [phone]  LOS: 3 days

## 2012-06-06 NOTE — Progress Notes (Signed)
*  PRELIMINARY RESULTS* Vascular Ultrasound Lower extremity venous duplex has been completed.  Preliminary findings: Bilateral:  No evidence of DVT, superficial thrombosis, or Baker's Cyst.    Farrel Demark, RDMS, RVT  06/06/2012, 1:34 PM

## 2012-06-06 NOTE — Progress Notes (Addendum)
Per RN, Pt. Complaining about CPAP. Mask not comfortable, Not tolerating. RN placed on NRB. Pt. Wore approximately 2.5 hrs.  Post nebulizer tx. RT placed pt. On 35% VM.

## 2012-06-06 NOTE — Progress Notes (Signed)
Echocardiogram 2D Echocardiogram has been performed.  Dennis Vasquez 06/06/2012, 3:37 PM

## 2012-06-06 NOTE — Progress Notes (Signed)
Placed pt on CPAP via full face mask. Pt. Requiring 8 lpm O2 bleed in to maintain SPO2 of 90-91%.  Pt. Not in favor of wearing the mask but will give it a try. Pt. Tolerating well at this time. RN aware.

## 2012-06-06 NOTE — Progress Notes (Signed)
PULMONARY  / CRITICAL CARE MEDICINE  Name: Dennis Vasquez MRN: [id number] DOB: 07-28-1945    LOS: 3  REFERRING PROVIDER:  Dr Hernandez/ [facility]  CHIEF COMPLAINT:  Short of breath  BRIEF PATIENT DESCRIPTION: 66 yo M former smoker, with past hx CAD/ Dr Daleen Squibb, "touch of COPD", remote chlorine RADS event after cleaning garage floor with Clorox and muriatic acid. Quit 80 pk year smoking habit 7 years ago. Dry cough and progressive DOE over past week. Presented to ER dyspneic/ hypoxic. CT neg for PE. Pulmonary consulted for hypoxia.  LINES / TUBES: periph IV  CULTURES:  ANTIBIOTICS:  SIGNIFICANT EVENTS:  12/2- int hypoxia 12/3- neg balance  LEVEL OF CARE:  Stepdown PRIMARY SERVICE: [facility] CONSULTANTS:  PCCM CODE STATUS:  Full DIET:  DVT Px: Lovenox GI Px:    Labs  Radiology CT a- reviewed- No PE, Some apical emphysema, bronchial thickening, and CAD. ECHO-10/11/11- EF 60%, diastolic dysfunction, RV and RA mildly dilated  INTERVAL HISTORY:Improved after neg 1.5 liters.  VITAL SIGNS: Temp:  [97.4 F (36.3 C)-98.4 F (36.9 C)] 98.1 F (36.7 C) (12/03 0419) Pulse Rate:  [76-111] 93  (12/03 0935) Resp:  [12-18] 16  (12/03 0935) BP: (126-163)/(70-84) 140/75 mmHg (12/03 0935) SpO2:  [90 %-98 %] 96 % (12/03 0943)  PHYSICAL EXAMINATION: General:  Obese, alert, cooperative Neuro:  No focal deficit HEENT: obese Neck:  No JVD, obese Cardiovascular:  RRR, no m/g/r. Trace nonpitting edema feet Lungs:  resp's even/non-labored, distant Abdomen:  Firmly obese, nontender, decreased bowel sounds Musculoskeletal:  Grossly normal Skin:  Early stasis dermatitis at calves   Lab 06/06/12 0415 06/04/12 0311 06/03/12 1606 06/03/12 1004  NA 136 135 -- 137  K 5.5* 3.9 -- 3.3*  CL 95* 96 -- 97  CO2 30 28 -- 29  BUN 38* 20 -- 13  CREATININE 1.07 1.01 1.06 --  GLUCOSE 375* 325* -- 220*    Lab 06/06/12 0415 06/04/12 0311 06/03/12 1606  HGB 15.5 15.3 16.1  HCT 46.4 44.3 47.3  WBC 20.6* 10.4  7.7  PLT 246 203 221   No results found.  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  Acute event is likely a viral bronchiolitis caught from wife, and hopefully responsive to steroids and bronchodilators.   Anatomy favors OSA - wife endorses that he snores and has periods of apnea.   COPD- PFT 11/13/07- FEV1 2.81/ 75%, FEV1/FVC 0.68, DLCO 61%, little response to bronchodilator This favors suspicion that his diffusion capacity was low at baseline, needing only an acute bronchiolitis to tip him over.  Presumed OSA  Morbid obesity, snoring and periods of pauses per wife.  Endorses lower extremity and abdominal swelling --?some element of PAH  PLAN: - some degree of hemoconcentration, may get over diuresed, reduce lasix -slight reduction in steroids but maintain -upright position as abdominal contribution to SOB significant -pcxr now and in am , I have ordered, will review -BDer's -some slight improvement noted attempting autoset CPAP, tolerated a few hours, re attempt this -will need outpt study  Mcarthur Rossetti. Tyson Alias, MD, FACP Pgr: [phone] [place] [facility]

## 2012-06-07 ENCOUNTER — Inpatient Hospital Stay (HOSPITAL_COMMUNITY): Payer: BC Managed Care – PPO

## 2012-06-07 LAB — GLUCOSE, CAPILLARY
Glucose-Capillary: 342 mg/dL — ABNORMAL HIGH (ref 70–99)
Glucose-Capillary: 142 mg/dL — ABNORMAL HIGH (ref 70–99)

## 2012-06-07 MED ORDER — BENZONATATE 100 MG PO CAPS
100.0000 mg | ORAL_CAPSULE | Freq: Three times a day (TID) | ORAL | Status: DC | PRN
  Administered 2012-06-07 – 2012-06-08 (×3): 100 mg via ORAL
  Filled 2012-06-07 (×4): qty 1

## 2012-06-07 MED ORDER — GUAIFENESIN-CODEINE 100-10 MG/5ML PO SOLN
10.0000 mL | ORAL | Status: DC | PRN
  Administered 2012-06-08 – 2012-06-09 (×4): 10 mL via ORAL
  Filled 2012-06-07: qty 10
  Filled 2012-06-07: qty 5
  Filled 2012-06-07: qty 10
  Filled 2012-06-07: qty 5
  Filled 2012-06-07: qty 10

## 2012-06-07 MED ORDER — INSULIN ASPART 100 UNIT/ML ~~LOC~~ SOLN
15.0000 [IU] | Freq: Three times a day (TID) | SUBCUTANEOUS | Status: DC
  Administered 2012-06-07 – 2012-06-09 (×7): 15 [IU] via SUBCUTANEOUS

## 2012-06-07 MED ORDER — INSULIN GLARGINE 100 UNIT/ML ~~LOC~~ SOLN
60.0000 [IU] | Freq: Every day | SUBCUTANEOUS | Status: DC
  Administered 2012-06-08 – 2012-06-09 (×2): 60 [IU] via SUBCUTANEOUS

## 2012-06-07 MED ORDER — PREDNISONE 20 MG PO TABS
40.0000 mg | ORAL_TABLET | Freq: Every day | ORAL | Status: DC
  Administered 2012-06-07 – 2012-06-08 (×2): 40 mg via ORAL
  Filled 2012-06-07 (×3): qty 2

## 2012-06-07 MED ORDER — INSULIN GLARGINE 100 UNIT/ML ~~LOC~~ SOLN: 10.0000 [IU] | Freq: Once | SUBCUTANEOUS | Status: DC

## 2012-06-07 NOTE — Progress Notes (Signed)
Patient transferred to 2Z30.  Oriented to room, wife at bedside.  Vital signs stable, no complaints of pain.  O2 3L on.  Will continue to monitor.

## 2012-06-07 NOTE — Progress Notes (Signed)
Pt. Refused CPAP  And states he is not able to wear the full face mask.

## 2012-06-07 NOTE — Progress Notes (Signed)
PULMONARY  / CRITICAL CARE MEDICINE  Name: ALEXANDROS EWAN MRN: [id number] DOB: [birth year]    LOS: 4  REFERRING PROVIDER:  Dr Hernandez/ [facility]  CHIEF COMPLAINT:  Short of breath  BRIEF PATIENT DESCRIPTION: 66 yo M former smoker, with past hx CAD/ Dr Daleen Squibb, "touch of COPD", remote chlorine RADS event after cleaning garage floor with Clorox and muriatic acid. Quit 80 pk year smoking habit 7 years ago. Dry cough and progressive DOE over past week. Presented to ER dyspneic/ hypoxic. CT neg for PE. Pulmonary consulted for hypoxia.  LINES / TUBES: periph IV  CULTURES:  ANTIBIOTICS:  SIGNIFICANT EVENTS:  12/2- int hypoxia 12/3- neg balance 12/3 venous doppler neg  Prelim 12/3 result 12/3 echo- grade 1 diastolic, 65% ef, rv wnl 12/3 d dimer 0.39  LEVEL OF CARE:  Stepdown PRIMARY SERVICE: [facility] CONSULTANTS:  PCCM CODE STATUS:  Full DIET: reg DVT Px: Lovenox GI Px:    Labs  Radiology CT a- reviewed- No PE, Some apical emphysema, bronchial thickening, and CAD. ECHO-10/11/11- EF 60%, diastolic dysfunction, RV and RA mildly dilated  INTERVAL HISTORY: Echo reviewed, doppler neg No desat  VITAL SIGNS: Temp:  [97.4 F (36.3 C)-98 F (36.7 C)] 97.6 F (36.4 C) (12/04 0809) Pulse Rate:  [65-91] 82  (12/04 0809) Resp:  [14-18] 16  (12/04 0809) BP: (134-157)/(62-88) 157/88 mmHg (12/04 0809) SpO2:  [90 %-98 %] 90 % (12/04 0845) FiO2 (%):  [35 %] 35 % (12/04 0248)  PHYSICAL EXAMINATION: General:  Obese, alert, cooperative Neuro:  No focal deficit HEENT: obese Neck:  No JVD, obese Cardiovascular:  RRR, no m Lungs:  resp's even/non-labored, distant, coughing Abdomen:  obese, nontender, bowel sounds wnl Musculoskeletal:  Grossly normal Skin:  Early stasis dermatitis at calves   Lab 06/06/12 0415 06/04/12 0311 06/03/12 1606 06/03/12 1004  NA 136 135 -- 137  K 5.5* 3.9 -- 3.3*  CL 95* 96 -- 97  CO2 30 28 -- 29  BUN 38* 20 -- 13  CREATININE 1.07 1.01 1.06 --  GLUCOSE 375* 325*  -- 220*    Lab 06/06/12 0415 06/04/12 0311 06/03/12 1606  HGB 15.5 15.3 16.1  HCT 46.4 44.3 47.3  WBC 20.6* 10.4 7.7  PLT 246 203 221   Dg Chest Port 1 View  06/07/2012  *RADIOLOGY REPORT*  Clinical Data: Edema.  PORTABLE CHEST - 1 VIEW  Comparison: One-view chest 06/06/2012.  Findings: The heart size is exaggerated by low lung volumes.  Mild pulmonary vascular congestion is stable.  Mild bibasilar atelectasis persists.  IMPRESSION:  1.  Stable mild pulmonary vascular congestion and bibasilar atelectasis.   Original Report Authenticated By: Marin Roberts, M.D.    Dg Chest Port 1 View  06/06/2012  *RADIOLOGY REPORT*  Clinical Data: Shortness of breath and cough.  PORTABLE CHEST - 1 VIEW  Comparison: Chest CT day, 06/03/2012 and chest radiograph of the same date.  Findings: The cardiac silhouette is normal in size and configuration.  No mediastinal or hilar masses or adenopathy noted.  Mild opacity at the left lung base noted previously is stable consistent with scarring/atelectasis.  The lungs are otherwise clear.  The bony thorax is intact.  IMPRESSION: No acute cardiopulmonary disease.  No change from the prior study.   Original Report Authenticated By: Amie Portland, M.D.     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  Acute  event is likely a viral bronchiolitis Anatomy favors OSA - wife endorses that he snores and has periods of apnea.   COPD- PFT 11/13/07- FEV1 2.81/ 75%, FEV1/FVC 0.68, DLCO 61%, little response to bronchodilator  Presumed OSA    PLAN: Again did poorly with autoset cpap, can dc this and have outpt study -pcxr better with neg 1.2 liters off again -he is lasix dep at home, volume status unclear to me, consider dc lasix and observe trend -CT reviewed intitially without any overt central PE, limited contrast to  bases, so I used neg pred value d dimer , dopplers and normal RV =  No PE -would taper pred over next 1 week, keep at 40 today -may need to control cough more aggressive -BDer'rs -he has some improvements but slow -pcxr follow up in am  -ambulate  Favor in end viral illness on top of comorbids  Mcarthur Rossetti. Tyson Alias, MD, FACP Pgr: [phone] [place] [facility]

## 2012-06-07 NOTE — Progress Notes (Signed)
[facility] Progress Note [place] TEAM 1 - Stepdown/ICU TEAM   Dennis Vasquez:[id number] DOB: May 15, 1946 DOA: 06/03/2012 PCP: Tillman Abide, MD  Brief narrative: 66 y/o man who started complaining of SOB on Tuesday. His wife has had strep throat, so they thought he could possibly have the same. On Tuesday he started developing a productive cough. No fevers, chills, CP. This has progressed to the point where he has had to stop work this past week. He went to the walk-in clinic at his PCPs office and had extreme difficulty even getting from the car to the front desk. His sats were noted to be in the mid 80s so he was sent to the ED for further evaluation. He quit smoking 7 years ago, has central heat at his house, has not been in enclosed spaces with running cars or purchased any new space/stove heaters. Had a cath in 2/13 that showed mild diffuse CAD and an EF of 55-65%. Has required a NRB since being in the ED with sats in the 92-93 range. PO2 is 68 on 100% NRB. CT chest is essentially unremarkable. BNP 47. We have been asked to admit him for further evaluation and management.   Assessment/Plan:  Acute hypoxic respiratory failure *CT Chest without evidence for PE, PNA, edema or interstitial disease - likely a viral bronchiolitis  *Continues to have exertional desaturations as well as desaturations with coughing but has been successfully weaned to 3 L nasal cannula--may require short-term nasal cannula oxygen after discharge *cont steroids but will transition from IV Solu-Medrol to prednisone-pulmonary medicine recommended tapering over a one-week period   Chronic diastolic heart failure grade 1 *New diagnosis this admission based on echocardiogram *Currently is euvolemic and has diuresed over 6000 cc since admission *Patient was on high-dose Lasix at home for chronic lower extremity dependent edema *We have stopped Lasix for now and will reevaluate prior to discharge *Did  discuss with patient that despite having lower extremity edema Lasix is not effective for this problem and we should actually go home his weights to determine if he is at risk for diastolic heart failure exacerbation and did when necessary Lasix based on weight gain  HTN *Better controlled - note early a.m. hypertension prior to receiving usual a.m. medication *Continue Norvasc and Lotensin  Obesity/presumed sleep apnea *Has not tolerated mask here for pulmonary medicine has discontinued CPAP *Pulmonary medicine notes will need a formal outpatient polysomnogram   COPD diagnosed 2009 *PFT 11/13/07- FEV1 2.81/ 75%, FEV1/FVC 0.68, DLCO 61%, little response to bronchodilator  *given that pt did not respond to bronchodilators use was minimized beginning 06/04/2012  HLD  DM *Remains poorly controlled in setting of steroids *Have further increased Lantus and meal coverage insulin today and will continue resistant sliding scale insulin  CAD - Non obstructive on cath Feb 2013 No chest pain   Chronic constipation *Now having multiple bowel movements with the addition of multiple laxative agents as well as prophylactic agents  MRSA screen + On contact isolation  Code Status: FULL Family Communication: spoke w/ pt and wife at bedside Disposition Plan: Transfer to floor  Consultants: Pulmonary  Procedures: None  Antibiotics: none  DVT prophylaxis: lovenox  HPI/Subjective: States no real change in breathing. Still notes oxygen levels decreasing and increasing fatigue with exertion. States has had at least 3 bowel movements since receiving magnesium citrate and MiraLax yesterday. States abdomen feels much less distended   Objective: Blood pressure 129/68, pulse 86, temperature 97.5 F (36.4 C), temperature  source Oral, resp. rate 15, height 6\' 3"  (1.905 m), weight 138 kg (304 lb 3.8 oz), SpO2 95.00%.  Intake/Output Summary (Last 24 hours) at 06/07/12 1351 Last data filed at  06/07/12 1100  Gross per 24 hour  Intake    480 ml  Output   1825 ml  Net  -1345 ml     Exam: General: In no acute respiratory distress Lungs: Clear to auscultation bilaterally without wheezes or crackles - poor air movement th/o , now on 3 L nasal cannula oxygen Cardiovascular: distant heart sounds - regular rate and rhythm without murmur gallop or rub normal S1 and S2 Abdomen: obese, non tender, soft, bowel sounds positive, no rebound, no ascites, no appreciable mass Musculoskeletal: No significant cyanosis, clubbing of bilateral lower extremities Neurological: Patient is alert and oriented x3, moves all extremities x4, exam otherwise non focal  Data Reviewed: Basic Metabolic Panel:  Lab 06/06/12 1610 06/04/12 0311 06/03/12 1606 06/03/12 1004  NA 136 135 -- 137  K 5.5* 3.9 -- 3.3*  CL 95* 96 -- 97  CO2 30 28 -- 29  GLUCOSE 375* 325* -- 220*  BUN 38* 20 -- 13  CREATININE 1.07 1.01 1.06 1.03  CALCIUM 8.8 9.0 -- 9.0  MG -- -- -- --  PHOS -- -- -- --   Liver Function Tests:  Lab 06/03/12 1004  AST 30  ALT 38  ALKPHOS 68  BILITOT 0.5  PROT 7.4  ALBUMIN 3.9   CBC:  Lab 06/06/12 0415 06/04/12 0311 06/03/12 1606 06/03/12 1004  WBC 20.6* 10.4 7.7 7.2  NEUTROABS -- -- -- 5.8  HGB 15.5 15.3 16.1 15.7  HCT 46.4 44.3 47.3 45.7  MCV 89.2 87.4 86.6 87.0  PLT 246 203 221 208   Cardiac Enzymes:  Lab 06/04/12 0312 06/03/12 2110 06/03/12 1606 06/03/12 1005  CKTOTAL -- -- -- --  CKMB -- -- -- --  CKMBINDEX -- -- -- --  TROPONINI <0.30 <0.30 <0.30 <0.30   BNP (last 3 results)  Basename 06/03/12 1005 09/28/11 1600  PROBNP 47.0 29.0   CBG:  Lab 06/07/12 1145 06/07/12 0807 06/06/12 2145 06/06/12 1609 06/06/12 1213  GLUCAP 314* 342* 256* 266* 355*    Recent Results (from the past 240 hour(s))  MRSA PCR SCREENING     Status: Abnormal   Collection Time   06/03/12  3:23 PM      Component Value Range Status Comment   MRSA by PCR POSITIVE (*) NEGATIVE Final   RAPID  STREP SCREEN     Status: Normal   Collection Time   06/03/12  4:12 PM      Component Value Range Status Comment   Streptococcus, Group A Screen (Direct) NEGATIVE  NEGATIVE Final      Studies:  Recent x-ray studies have been reviewed in detail by the Attending Physician  Scheduled Meds:  Reviewed in detail by the Attending Physician   Junious Silk, ANP [facility] Office  [phone] Pager [phone]  On-Call/Text Page:      [email or web address]      password [email or web address]  If 7PM-7AM, please contact night-coverage [email or web address] Password [email or web address] 06/07/2012, 1:51 PM    LOS: 4 days   Patient seen and examined. Chart reviewed. Agree with Junious Silk, NP. I agree with the above note which I have modified.  Elisama Thissen A, MD 06/07/2012, 5:16 PM

## 2012-06-08 ENCOUNTER — Inpatient Hospital Stay (HOSPITAL_COMMUNITY): Payer: BC Managed Care – PPO

## 2012-06-08 LAB — CBC
HCT: 45.9 % (ref 39.0–52.0)
Hemoglobin: 15.4 g/dL (ref 13.0–17.0)
MCV: 88.3 fL (ref 78.0–100.0)
WBC: 20.7 10*3/uL — ABNORMAL HIGH (ref 4.0–10.5)

## 2012-06-08 LAB — BASIC METABOLIC PANEL
BUN: 35 mg/dL — ABNORMAL HIGH (ref 6–23)
CO2: 26 mEq/L (ref 19–32)
Chloride: 98 mEq/L (ref 96–112)
GFR calc Af Amer: 84 mL/min — ABNORMAL LOW (ref >90)
Potassium: 4.2 mEq/L (ref 3.5–5.1)

## 2012-06-08 LAB — GLUCOSE, CAPILLARY
Glucose-Capillary: 193 mg/dL — ABNORMAL HIGH (ref 70–99)
Glucose-Capillary: 144 mg/dL — ABNORMAL HIGH (ref 70–99)

## 2012-06-08 MED ORDER — SALINE SPRAY 0.65 % NA SOLN
1.0000 | Freq: Two times a day (BID) | NASAL | Status: DC
  Administered 2012-06-08 – 2012-06-09 (×2): 1 via NASAL
  Filled 2012-06-08: qty 44

## 2012-06-08 MED ORDER — FLUTICASONE PROPIONATE 50 MCG/ACT NA SUSP
2.0000 | Freq: Every day | NASAL | Status: DC
  Administered 2012-06-08 – 2012-06-09 (×2): 2 via NASAL
  Filled 2012-06-08: qty 16

## 2012-06-08 MED ORDER — SALINE SPRAY 0.65 % NA SOLN: 1.0000 | NASAL | Status: DC | PRN

## 2012-06-08 MED ORDER — PREDNISONE 20 MG PO TABS
30.0000 mg | ORAL_TABLET | Freq: Every day | ORAL | Status: DC
  Administered 2012-06-09: 30 mg via ORAL
  Filled 2012-06-08 (×2): qty 1

## 2012-06-08 NOTE — Progress Notes (Signed)
Inpatient Diabetes Program Recommendations  AACE/ADA: New Consensus Statement on Inpatient Glycemic Control (2013)  Target Ranges:  Prepandial:   less than 140 mg/dL      Peak postprandial:   less than 180 mg/dL (1-2 hours)      Critically ill patients:  140 - 180 mg/dL   Reason for Visit: Fasting hyperglycemia  Inpatient Diabetes Program Recommendations Insulin - Basal: Fasting glucose levels still high. Recommend an increase in Lantus to 70 units at this time. Insulin - Meal Coverage: Increase Novolog with meals to 10 units TID  Note: Thank you, Lenor Coffin, RN, CNS, Diabetes Coordinator [phone])

## 2012-06-08 NOTE — Progress Notes (Signed)
Pt ambulated in hall without O2. Sat never dropped below 93%, yet he appeared very dyspneic.Irena Reichmann 06/08/2012
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
## 2012-06-08 NOTE — Progress Notes (Signed)
RT Note: Pt refusing CPAP he stated he has not had a sleep study and didn't like the mask we had down stairs.
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
## 2012-06-09 LAB — CBC
Platelets: 233 10*3/uL (ref 150–400)
RDW: 13.6 % (ref 11.5–15.5)
WBC: 14.7 10*3/uL — ABNORMAL HIGH (ref 4.0–10.5)

## 2012-06-09 LAB — GLUCOSE, CAPILLARY
Glucose-Capillary: 163 mg/dL — ABNORMAL HIGH (ref 70–99)
Glucose-Capillary: 177 mg/dL — ABNORMAL HIGH (ref 70–99)
Glucose-Capillary: 314 mg/dL — ABNORMAL HIGH (ref 70–99)

## 2012-06-09 MED ORDER — INSULIN ASPART 100 UNIT/ML ~~LOC~~ SOLN: 15.0000 [IU] | Freq: Three times a day (TID) | SUBCUTANEOUS | Status: DC

## 2012-06-09 MED ORDER — OMEGA-3-ACID ETHYL ESTERS 1 G PO CAPS: 2.0000 g | ORAL_CAPSULE | Freq: Two times a day (BID) | ORAL | Status: DC

## 2012-06-09 MED ORDER — INSULIN GLARGINE 100 UNIT/ML ~~LOC~~ SOLN: 60.0000 [IU] | Freq: Every day | SUBCUTANEOUS | Status: DC

## 2012-06-09 MED ORDER — PREDNISONE 10 MG PO TABS: 5.0000 mg | ORAL_TABLET | Freq: Every day | ORAL | Status: DC

## 2012-06-09 MED ORDER — GUAIFENESIN-CODEINE 100-10 MG/5ML PO SOLN
10.0000 mL | ORAL | Status: DC | PRN
Start: 2012-06-09 — End: 2012-06-26

## 2012-06-09 NOTE — Progress Notes (Signed)
Pt discharged to home discharge instructions explained pt verbalized understanding

## 2012-06-09 NOTE — Progress Notes (Signed)
o2 sat 94-96% while ambulating.
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
## 2012-06-12 ENCOUNTER — Telehealth: Payer: Self-pay | Admitting: Internal Medicine

## 2012-06-12 NOTE — Telephone Encounter (Signed)
Thanks for trying  Needs hospital follow up

## 2012-06-12 NOTE — Telephone Encounter (Signed)
Dr. Alphonsus Sias asked me to call and schedule a follow up appointment for patient at the end of the week.  Patient's home number is his wife's office phone.  I left a message and asked patient's wife to call me back and schedule follow up appointment at the end of this week.  I tried the cell phone number,but it was busy each time I called.

## 2012-06-21 ENCOUNTER — Telehealth: Payer: Self-pay | Admitting: Internal Medicine

## 2012-06-21 NOTE — Telephone Encounter (Signed)
Patient is back at work and he works third shift. Patient was diagnosed with MRSA at the hospital.  Patient has a place on his bottom and he's going to see Dr.Pickard tomorrow.  I told patient's wife you could see him today,but patient just went to bed and can't come today.  Patient's wife might take patient to Urgent Care today.  Patient's wife cancelled patient's follow up appointment from the hospital tomorrow with you and she'll call back to reschedule the appointment.  Patient has a pulmonary appointment on Monday.  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

## 2012-06-21 NOTE — Telephone Encounter (Signed)
He has a follow up with Dr Craige Cotta next week so they can hold off on follow up with me

## 2012-06-22 ENCOUNTER — Ambulatory Visit: Payer: BC Managed Care – PPO | Admitting: Internal Medicine

## 2012-06-26 ENCOUNTER — Encounter: Payer: Self-pay | Admitting: Pulmonary Disease

## 2012-06-26 ENCOUNTER — Ambulatory Visit (INDEPENDENT_AMBULATORY_CARE_PROVIDER_SITE_OTHER): Payer: BC Managed Care – PPO | Admitting: Pulmonary Disease

## 2012-06-26 ENCOUNTER — Ambulatory Visit (INDEPENDENT_AMBULATORY_CARE_PROVIDER_SITE_OTHER)
Admission: RE | Admit: 2012-06-26 | Discharge: 2012-06-26 | Disposition: A | Discharge status: Home / Self Care | Payer: BC Managed Care – PPO | Source: Ambulatory Visit | Attending: Pulmonary Disease | Admitting: Pulmonary Disease

## 2012-06-26 VITALS — BP 122/68 | HR 108 | Temp 98.0°F | Ht 75.0 in | Wt 309.2 lb

## 2012-06-26 DIAGNOSIS — J449 Chronic obstructive pulmonary disease, unspecified: Secondary | ICD-10-CM

## 2012-06-26 DIAGNOSIS — J9611 Chronic respiratory failure with hypoxia: Secondary | ICD-10-CM

## 2012-06-26 DIAGNOSIS — R0902 Hypoxemia: Secondary | ICD-10-CM

## 2012-06-26 DIAGNOSIS — R0989 Other specified symptoms and signs involving the circulatory and respiratory systems: Secondary | ICD-10-CM

## 2012-06-26 DIAGNOSIS — J219 Acute bronchiolitis, unspecified: Secondary | ICD-10-CM

## 2012-06-26 DIAGNOSIS — J218 Acute bronchiolitis due to other specified organisms: Secondary | ICD-10-CM

## 2012-06-26 DIAGNOSIS — J9610 Chronic respiratory failure, unspecified whether with hypoxia or hypercapnia: Secondary | ICD-10-CM

## 2012-06-26 DIAGNOSIS — R0683 Snoring: Secondary | ICD-10-CM
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
## 2012-06-26 NOTE — Patient Instructions (Addendum)
Will arrange for home oxygen set up Chest xray today>>will call with results Will arrange for sleep study Follow up in 6 weeks

## 2012-06-27 ENCOUNTER — Telehealth: Payer: Self-pay | Admitting: Pulmonary Disease

## 2012-06-27 ENCOUNTER — Encounter: Payer: Self-pay | Admitting: Pulmonary Disease

## 2012-06-27 DIAGNOSIS — R0683 Snoring: Secondary | ICD-10-CM

## 2012-06-27 DIAGNOSIS — J449 Chronic obstructive pulmonary disease, unspecified: Secondary | ICD-10-CM

## 2012-06-27 DIAGNOSIS — G4733 Obstructive sleep apnea (adult) (pediatric): Secondary | ICD-10-CM | POA: Insufficient documentation

## 2012-06-27 DIAGNOSIS — J219 Acute bronchiolitis, unspecified: Secondary | ICD-10-CM | POA: Insufficient documentation

## 2012-06-27 HISTORY — DX: Snoring: R06.83

## 2012-06-27 NOTE — Telephone Encounter (Signed)
Order has been sent to [facility]. LMOMTCB X1 for spouse to make her aware

## 2012-06-27 NOTE — Assessment & Plan Note (Signed)
Clinically he is improved after recent hospitalization.  I don't think he needs additional prednisone at this time.  Will repeat his chest xray and call him with results.

## 2012-06-27 NOTE — Assessment & Plan Note (Signed)
He reports snoring, sleep disruption, and daytime sleepiness.  He has history of CAD, HTN, and DM type II.  I am concerned he could have sleep apnea.  He also may have sleep related hypoventilation 2nd to COPD and/or obesity.  To further assess will arrange for in lab sleep study.

## 2012-06-27 NOTE — Telephone Encounter (Signed)
Please send order for portable oxygen.

## 2012-06-27 NOTE — Assessment & Plan Note (Signed)
He can continue albuterol as needed.  Will allow for more time to recover from recent bronchiolitis, and then will need to repeat pulmonary function testing.

## 2012-06-27 NOTE — Assessment & Plan Note (Signed)
His SpO2 on room air at rest today was 87%.  He improved after addition of 2 liters oxygen at rest and with exertion.  Will set up home oxygen at 2 liters 24/7.  Will arrange for overnight oximetry on 2 liters.

## 2012-06-27 NOTE — Telephone Encounter (Signed)
I spoke with spouse and she stated she would also like to have pt set up with portable oxygen as well. She states he works and can take this to work with him. She stated he went to work w/o it last night. She is not sure if this would be sufficient enough for pt or if he would run out since he needs to be on continuous. Please advise Dr. Craige Cotta thanks

## 2012-06-29 ENCOUNTER — Telehealth: Payer: Self-pay | Admitting: Pulmonary Disease

## 2012-06-29 NOTE — Telephone Encounter (Signed)
Left message at home number that message has been taken care of and if any questions or concerns please call our office.

## 2012-06-29 NOTE — Telephone Encounter (Signed)
Dg Chest 2 View  06/26/2012  *RADIOLOGY REPORT*  Clinical Data: Shortness of breath, cough  CHEST - 2 VIEW  Comparison: Chest x-ray of 06/08/2012 and CT chest of 06/03/2012  Findings: No active infiltrate or effusion is seen.  Prominent hila particular on the left.  Be due to pulmonary arterial hypertension when compared to the prior CT.  Mediastinal contours are stable. The heart is within normal limits in size.  There is mild peribronchial thickening present.  No bony abnormality is seen.  IMPRESSION: No active lung disease.  Peribronchial thickening may indicate bronchitis.   Original Report Authenticated By: Dwyane Dee, M.D.     Attempted to contact pt to discuss results.  Will have my nurse inform pt that CXR looks better compared to hospital CXR.  No change to current treatment plan.

## 2012-07-03 NOTE — Telephone Encounter (Signed)
Spouse aware of results. She voiced her understanding. Nothing further was needed

## 2012-07-10 ENCOUNTER — Encounter: Payer: BC Managed Care – PPO | Admitting: Physician Assistant

## 2012-07-11 ENCOUNTER — Encounter (HOSPITAL_BASED_OUTPATIENT_CLINIC_OR_DEPARTMENT_OTHER): Payer: BC Managed Care – PPO

## 2012-07-17 ENCOUNTER — Ambulatory Visit (HOSPITAL_BASED_OUTPATIENT_CLINIC_OR_DEPARTMENT_OTHER): Payer: BC Managed Care – PPO | Attending: Pulmonary Disease | Admitting: Radiology

## 2012-07-17 VITALS — Ht 75.0 in

## 2012-07-17 DIAGNOSIS — J4489 Other specified chronic obstructive pulmonary disease: Secondary | ICD-10-CM | POA: Insufficient documentation

## 2012-07-17 DIAGNOSIS — I10 Essential (primary) hypertension: Secondary | ICD-10-CM | POA: Insufficient documentation

## 2012-07-17 DIAGNOSIS — I2510 Atherosclerotic heart disease of native coronary artery without angina pectoris: Secondary | ICD-10-CM | POA: Insufficient documentation

## 2012-07-17 DIAGNOSIS — J449 Chronic obstructive pulmonary disease, unspecified: Secondary | ICD-10-CM | POA: Insufficient documentation

## 2012-07-17 DIAGNOSIS — Z79899 Other long term (current) drug therapy: Secondary | ICD-10-CM | POA: Insufficient documentation

## 2012-07-17 DIAGNOSIS — Z794 Long term (current) use of insulin: Secondary | ICD-10-CM | POA: Insufficient documentation

## 2012-07-17 DIAGNOSIS — G4733 Obstructive sleep apnea (adult) (pediatric): Secondary | ICD-10-CM

## 2012-07-17 DIAGNOSIS — E119 Type 2 diabetes mellitus without complications: Secondary | ICD-10-CM | POA: Insufficient documentation

## 2012-07-17 DIAGNOSIS — R0683 Snoring: Secondary | ICD-10-CM

## 2012-07-17 DIAGNOSIS — Z7982 Long term (current) use of aspirin: Secondary | ICD-10-CM | POA: Insufficient documentation

## 2012-07-26 ENCOUNTER — Ambulatory Visit: Payer: BC Managed Care – PPO | Admitting: Cardiology

## 2012-07-27 DIAGNOSIS — G4733 Obstructive sleep apnea (adult) (pediatric): Secondary | ICD-10-CM

## 2012-07-28 NOTE — Procedures (Signed)
NAME:  Dennis Vasquez, Dennis Vasquez NO.:  [id number]  MEDICAL RECORD NO.:  [id number]          PATIENT TYPE:  OUT  LOCATION:  SLEEP CENTER                 FACILITY:  [facility]  PHYSICIAN:  Coralyn Helling, MD        DATE OF BIRTH:  08/05/1945  DATE OF STUDY:  07/17/2012                           NOCTURNAL POLYSOMNOGRAM  REFERRING PHYSICIAN:  Coralyn Helling, MD  FACILITY:  [facility].  INDICATION:  Mr. Denherder is a 67 year old male who has a history of COPD, coronary artery disease, hypertension, and diabetes.  He also reports snoring, sleep disruption, and daytime sleepiness.  He is referred to the sleep lab for evaluation of hypersomnia with obstructive sleep apnea.  Height is 6 feet 3 inches, weight is 310 pounds.  BMI is 39.  Neck size is 19 inches.  MEDICATIONS:  Insulin, Lotrel, clonidine, furosemide, pravastatin, albuterol, aspirin, and Lantus.  EPWORTH SCORE:  12.  SLEEP ARCHITECTURE:  The patient followed a split night study protocol. During the diagnostic portion of the study, total recording time was 250 minutes.  Total sleep time was 170 minutes.  Sleep efficiency was 16%, sleep latency was 24 minutes.  This portion of study was notable for lack of REM sleep.  He slept predominantly in the nonsupine position.  During the titration portion of study, total recording time was 112 minutes.  Total sleep time was 27 minutes.  Sleep efficiency was 24%. Sleep latency was 1 minute.  This portion of study was notable for lack of slow-wave sleep and REM sleep.  He slept exclusively in the nonsupine position.  RESPIRATORY DATA:  The average respiratory rate was 20.  Loud snoring was noted by the technician.  During the diagnostic portion of the study, the overall apnea/hypopnea index was 12.  The events were exclusively obstructive in nature.  During the titration portion of the study, the patient was started on CPAP of 4, increased to 9 cm of water.  He became  restless during the study, and as a result, it was difficult to achieve adequate titration due to lack of sufficient sleep time while using CPAP.  CARDIAC DATA:  The average heart rate was 70, and the rhythm strip showed sinus rhythm with occasional PVCs.  MOVEMENT/PARASOMNIA:  The patient had 1 restroom trip.  The periodic limb movement index was 59.1 during the diagnostic portion of study, but 0 during the titration portion of the study.  OXYGEN DATA:  The baseline oxygenation was 91%.  The oxygen saturation nadir was 87%.  The study was conducted without the use of supplemental oxygen.  IMPRESSION:  This study shows evidence for mild obstructive sleep apnea with an apnea/hypopnea index of 12 and oxygen saturation nadir of 87%.  He did have an increase in his periodic limb movement index during the diagnostic portion of study, and clinical correlation would be necessary to determine the significance of this.  He had a suboptimal titration portion of the study due to insufficient sleep time.  In addition to diet, exercise, and weight reduction, additional therapeutic interventions could include CPAP therapy, oral appliance, or surgical intervention.     Coralyn Helling, MD Diplomat, American Board  of Sleep Medicine    VS/MEDQ  D:  07/27/2012 15:12:27  T:  07/28/2012 01:53:19  Job:  [id number]

## 2012-07-31 ENCOUNTER — Telehealth: Payer: Self-pay | Admitting: Pulmonary Disease

## 2012-07-31 NOTE — Telephone Encounter (Signed)
PSG 07/17/12 >> AHI 12, SpO2 low 87%, PLMI 59.1 >> 0, sub-optimal titration.  Will have my nurse schedule ROV to review results.

## 2012-08-02 ENCOUNTER — Ambulatory Visit: Payer: BC Managed Care – PPO | Admitting: Cardiology

## 2012-08-02 NOTE — Telephone Encounter (Signed)
lmomtcb x1 for pt 

## 2012-08-07 NOTE — Telephone Encounter (Signed)
lmomtcb x2 for pt 

## 2012-08-09 ENCOUNTER — Encounter: Payer: Self-pay | Admitting: *Deleted

## 2012-08-09 NOTE — Telephone Encounter (Signed)
lmomtcb x3 for pt spouse. Will send letter out to pt

## 2012-08-10 NOTE — Telephone Encounter (Signed)
Pt's wife returning call can be reached at [phone] after 10:30.Dennis Vasquez

## 2012-08-11 NOTE — Telephone Encounter (Signed)
lmomtcb  

## 2012-08-11 NOTE — Telephone Encounter (Signed)
Spouse called back again.  She can be reached @ [phone]. Leanora Ivanoff

## 2012-08-14 ENCOUNTER — Ambulatory Visit: Payer: BC Managed Care – PPO | Admitting: Pulmonary Disease

## 2012-08-16 ENCOUNTER — Telehealth: Payer: Self-pay | Admitting: Pulmonary Disease

## 2012-08-16 NOTE — Telephone Encounter (Signed)
Pt is scheduled to see VS 

## 2012-08-16 NOTE — Telephone Encounter (Signed)
ONO with 2L 08/07/12 >> Test time 4 hrs 37 min.  Mean SpO2 92.7%, low SpO2 86%.  Spent 1 min 36 sec with SpO2 < 88%.  Will have my nurse inform pt that oxygen test was okay.  Will discuss in more detail at [facility] on 09/01/12.

## 2012-08-22 NOTE — Telephone Encounter (Signed)
lmtcb x1 for pt. 

## 2012-08-24 NOTE — Telephone Encounter (Signed)
lmomtcb x2 for pt 

## 2012-08-29 ENCOUNTER — Encounter: Payer: Self-pay | Admitting: *Deleted

## 2012-08-29 NOTE — Telephone Encounter (Signed)
lmtcb x3 will send letter out to pt home address.

## 2012-09-01 ENCOUNTER — Ambulatory Visit (INDEPENDENT_AMBULATORY_CARE_PROVIDER_SITE_OTHER): Payer: BC Managed Care – PPO | Admitting: Pulmonary Disease

## 2012-09-01 ENCOUNTER — Encounter: Payer: Self-pay | Admitting: Pulmonary Disease

## 2012-09-01 VITALS — BP 140/82 | HR 83 | Temp 97.4°F | Ht 75.0 in | Wt 323.0 lb

## 2012-09-01 DIAGNOSIS — G4733 Obstructive sleep apnea (adult) (pediatric): Secondary | ICD-10-CM

## 2012-09-01 DIAGNOSIS — R0902 Hypoxemia: Secondary | ICD-10-CM

## 2012-09-01 DIAGNOSIS — J438 Other emphysema: Secondary | ICD-10-CM

## 2012-09-01 DIAGNOSIS — J439 Emphysema, unspecified: Secondary | ICD-10-CM

## 2012-09-01 DIAGNOSIS — J9611 Chronic respiratory failure with hypoxia: Secondary | ICD-10-CM

## 2012-09-01 DIAGNOSIS — J9610 Chronic respiratory failure, unspecified whether with hypoxia or hypercapnia: Secondary | ICD-10-CM

## 2012-09-01 NOTE — Assessment & Plan Note (Signed)
His oxygen level has improved, and he no longer needs oxygen with rest or activity.  Will arrange for ONO with CPAP and room air, and then determine if he can d/c home oxygen set up.

## 2012-09-01 NOTE — Patient Instructions (Signed)
Will arrange for CPAP set up at home Will arrange for breathing test (PFT) Will arrange for overnight oxygen test using CPAP Follow up in 8 weeks

## 2012-09-01 NOTE — Assessment & Plan Note (Signed)
Will arrange for PFT's to further assess.  Will continue prn albuterol for now.

## 2012-09-01 NOTE — Assessment & Plan Note (Signed)
He has mild sleep apnea.  I have reviewed his sleep test results with the patient.  Explained how sleep apnea can affect the patient's health.  Driving precautions and importance of weight loss were discussed.  Treatment options for sleep apnea were reviewed.  Will arrange for auto CPAP set up.
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
## 2012-10-03 ENCOUNTER — Telehealth: Payer: Self-pay | Admitting: Pulmonary Disease

## 2012-10-03 DIAGNOSIS — G4733 Obstructive sleep apnea (adult) (pediatric): Secondary | ICD-10-CM

## 2012-10-03 NOTE — Telephone Encounter (Signed)
lmomtcb x1 

## 2012-10-03 NOTE — Telephone Encounter (Signed)
ONO with CPAP and RA 09/07/12 >> Test time 7 hrs 8 min.  Mean SpO2 92.3%, SpO2 low 87%.  Spent 2 min with SpO2 < 89%.  Will have my nurse inform patient oxygen test looked good.  He no longer needs to use oxygen at night.  He is to continue using CPAP at night.

## 2012-10-04 ENCOUNTER — Telehealth: Payer: Self-pay | Admitting: Pulmonary Disease

## 2012-10-04 NOTE — Telephone Encounter (Signed)
LMTCBX1 at home number. Carron Curie, CMA

## 2012-10-04 NOTE — Telephone Encounter (Signed)
lmomtcb x 2  

## 2012-10-04 NOTE — Telephone Encounter (Signed)
I spoke with spouse and is aware of results.

## 2012-10-05 NOTE — Telephone Encounter (Signed)
I spoke with pt spouse. She states pt is fine with giving up with the oxygen. She stated pt is tired of dealing with the CPAP company and so is she. She stated they asked for her debit card/checking account information to bill her for 13 months and does not like this. She does not like giving this information out to anyone. She stated they gave her 2 different estimates about how much they would pay if they have meet their deducible or not and also never brought pt a bigger mask.   I called Kim back and had to [facility]

## 2012-10-06 NOTE — Telephone Encounter (Signed)
I have spoke with Selena Batten at [facility] Selena Batten states once patient had over night pulse ox done he no longer needed the o2 and order was placed to d/c Selena Batten states when they went to pick up it he refused to give it up and stated he was not going to wear the CPAP  I have informed Selena Batten that per patient spouse he will give up the o2.  Selena Batten states she will call patients spouse back about this matter. Once Selena Batten speaks with spouse about o2, will speak with spouse about cpap company/switch.

## 2012-10-10 ENCOUNTER — Ambulatory Visit: Payer: BC Managed Care – PPO | Admitting: Cardiology

## 2012-10-10 ENCOUNTER — Telehealth: Payer: Self-pay | Admitting: Pulmonary Disease

## 2012-10-10 NOTE — Telephone Encounter (Signed)
lmomtcb x1 

## 2012-10-11 NOTE — Telephone Encounter (Signed)
I spoke with the pt wife and advised that the order was correct and that VS wanted oxygen d/c at home. I advised that VS states they could come in for an assessment because he did not qualify for oxygen at last OV so insurance will not cover oxygen now. She states she will speak with the pt and if he decides to set OV they will call back. Carron Curie, CMA

## 2012-10-11 NOTE — Telephone Encounter (Signed)
LMTCBx1.Dennis Vasquez, CMA  

## 2012-10-11 NOTE — Telephone Encounter (Signed)
Please arrange for patient to come to office to be assessed for home oxygen need.

## 2012-10-11 NOTE — Telephone Encounter (Signed)
I spoke with the pt spouse and she states she wants to clarify the order sent for the pt. She states the pt still feels like he needs the oxygen with activity at times. She states he still gets very SOB with minimal activity and they want to keep the oxygen for exertion. Per last OV note VS recs were as follows: His oxygen level has improved, and he no longer needs oxygen with rest or activity. Will arrange for ONO with CPAP and room air, and then determine if he can d/c home oxygen set up.  ONO was negative for the need for oxygen use as well so order was placed to d/c home o2 set-up, but pt refuses to give up oxygen. Dr. Craige Cotta please advise if ok for the pt to keep oxygen? I see at last OV he did not desaturate with walk test. Please advise. Carron Curie, CMA

## 2012-10-17 ENCOUNTER — Encounter: Payer: Self-pay | Admitting: Pulmonary Disease

## 2012-10-23 ENCOUNTER — Other Ambulatory Visit (INDEPENDENT_AMBULATORY_CARE_PROVIDER_SITE_OTHER): Payer: BC Managed Care – PPO

## 2012-10-23 DIAGNOSIS — Z125 Encounter for screening for malignant neoplasm of prostate: Secondary | ICD-10-CM

## 2012-10-23 DIAGNOSIS — Z0000 Encounter for general adult medical examination without abnormal findings: Secondary | ICD-10-CM

## 2012-10-23 LAB — COMPLETE METABOLIC PANEL WITH GFR
ALT: 32 U/L (ref 0–53)
AST: 22 U/L (ref 0–37)
Alkaline Phosphatase: 63 U/L (ref 39–117)
CO2: 30 mEq/L (ref 19–32)
Creat: 1.25 mg/dL (ref 0.50–1.35)
GFR, Est African American: 70 mL/min
Sodium: 143 mEq/L (ref 135–145)
Total Bilirubin: 0.5 mg/dL (ref 0.3–1.2)
Total Protein: 6.9 g/dL (ref 6.0–8.3)

## 2012-10-23 LAB — TSH: TSH: 2.312 u[IU]/mL (ref 0.350–4.500)

## 2012-10-23 LAB — LIPID PANEL
Cholesterol: 157 mg/dL (ref 0–200)
Total CHOL/HDL Ratio: 3.5 Ratio
Triglycerides: 216 mg/dL — ABNORMAL HIGH (ref <150)

## 2012-10-23 LAB — HEMOGLOBIN A1C: Mean Plasma Glucose: 169 mg/dL — ABNORMAL HIGH (ref <117)

## 2012-10-30 ENCOUNTER — Ambulatory Visit: Payer: BC Managed Care – PPO | Admitting: Pulmonary Disease

## 2012-11-13 ENCOUNTER — Ambulatory Visit (INDEPENDENT_AMBULATORY_CARE_PROVIDER_SITE_OTHER): Payer: BC Managed Care – PPO | Admitting: Family Medicine

## 2012-11-13 ENCOUNTER — Encounter: Payer: Self-pay | Admitting: Family Medicine

## 2012-11-13 VITALS — BP 142/78 | HR 86 | Temp 98.1°F | Resp 18 | Wt 318.0 lb

## 2012-11-13 DIAGNOSIS — E785 Hyperlipidemia, unspecified: Secondary | ICD-10-CM

## 2012-11-13 DIAGNOSIS — E119 Type 2 diabetes mellitus without complications: Secondary | ICD-10-CM

## 2012-11-13 DIAGNOSIS — I10 Essential (primary) hypertension: Secondary | ICD-10-CM

## 2012-11-13 MED ORDER — VARDENAFIL HCL 20 MG PO TABS: 20.0000 mg | ORAL_TABLET | Freq: Every day | ORAL | Status: DC | PRN

## 2012-11-13 MED ORDER — CLONIDINE HCL 0.3 MG PO TABS: 0.3000 mg | ORAL_TABLET | Freq: Three times a day (TID) | ORAL | Status: DC

## 2012-11-13 MED ORDER — GLUCOSE BLOOD VI STRP: ORAL_STRIP | Status: DC
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
## 2013-04-04 ENCOUNTER — Other Ambulatory Visit: Payer: Self-pay | Admitting: Family Medicine

## 2013-04-22 ENCOUNTER — Other Ambulatory Visit: Payer: Self-pay | Admitting: Family Medicine

## 2013-04-22 NOTE — Telephone Encounter (Signed)
Medication refilled per protocol. 

## 2013-04-23 ENCOUNTER — Ambulatory Visit (INDEPENDENT_AMBULATORY_CARE_PROVIDER_SITE_OTHER): Payer: BC Managed Care – PPO | Admitting: Family Medicine

## 2013-04-23 DIAGNOSIS — Z23 Encounter for immunization: Secondary | ICD-10-CM

## 2013-05-12 ENCOUNTER — Other Ambulatory Visit: Payer: Self-pay | Admitting: Family Medicine

## 2013-05-13 IMAGING — CR DG CHEST 2V
3 series · 3 of 3 positions shown · non-contrast
Comparison: Chest x-ray of 06/08/2012 and CT chest of 06/03/2012

CLINICAL DATA: Shortness of breath, cough

CHEST - 2 VIEW

[view not recorded (1 of 3)]
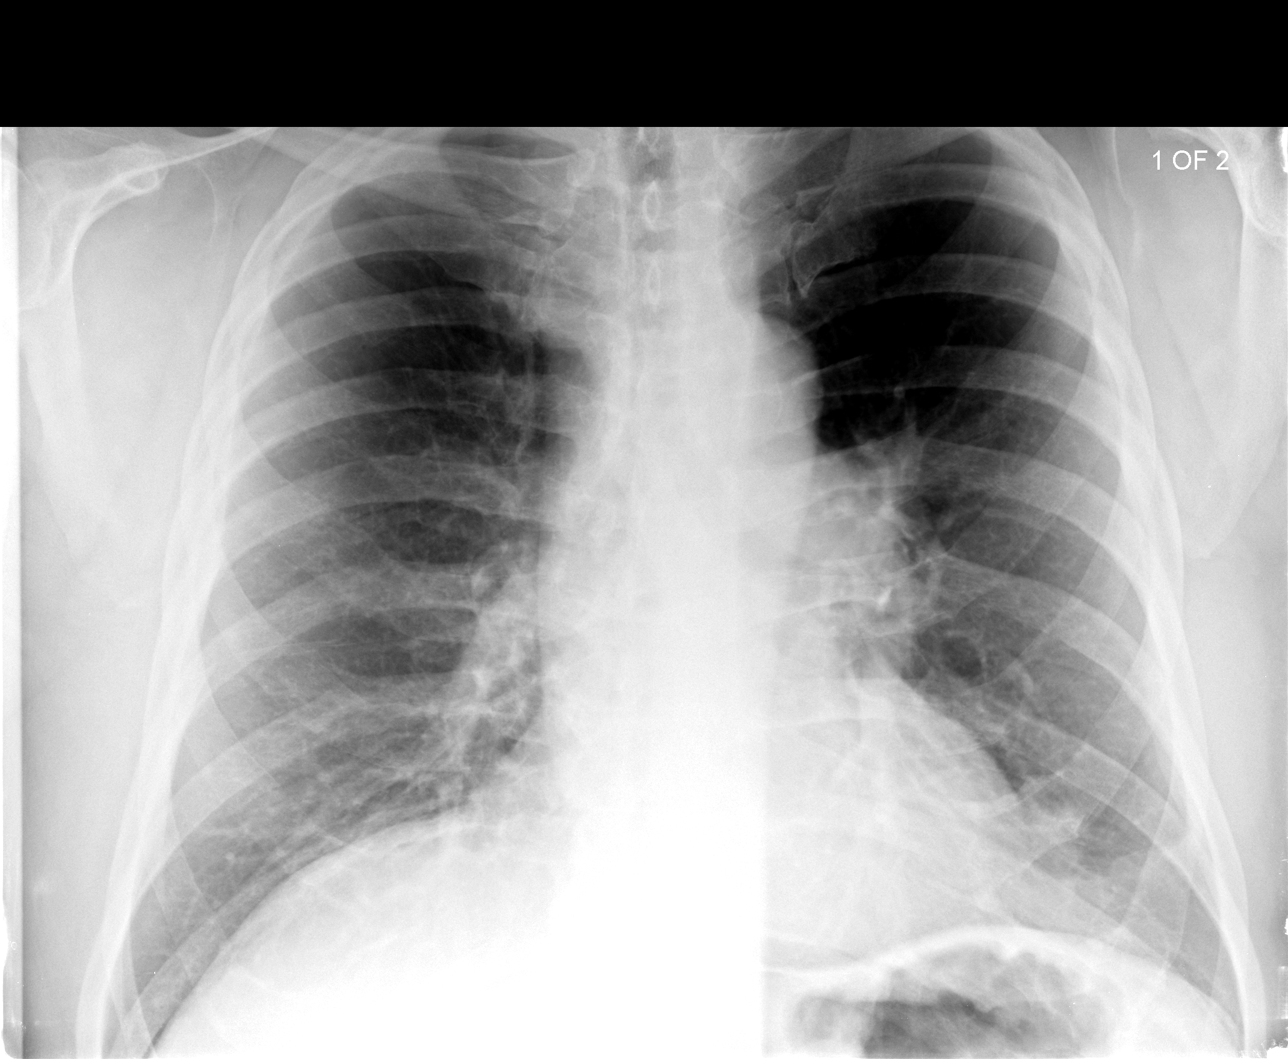

[view not recorded (2 of 3)]
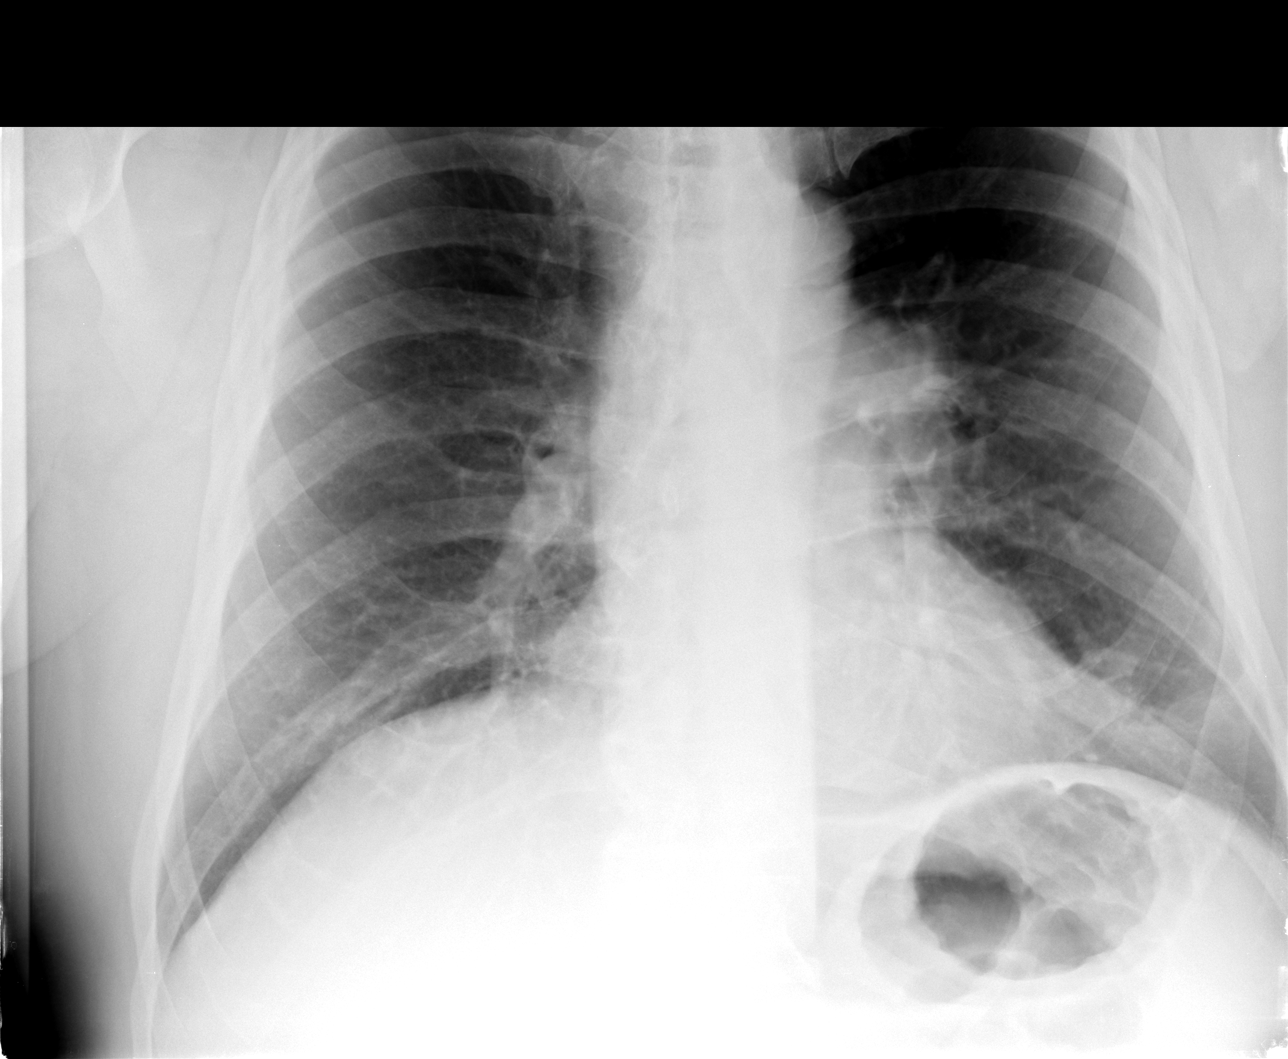

[view not recorded (3 of 3)]
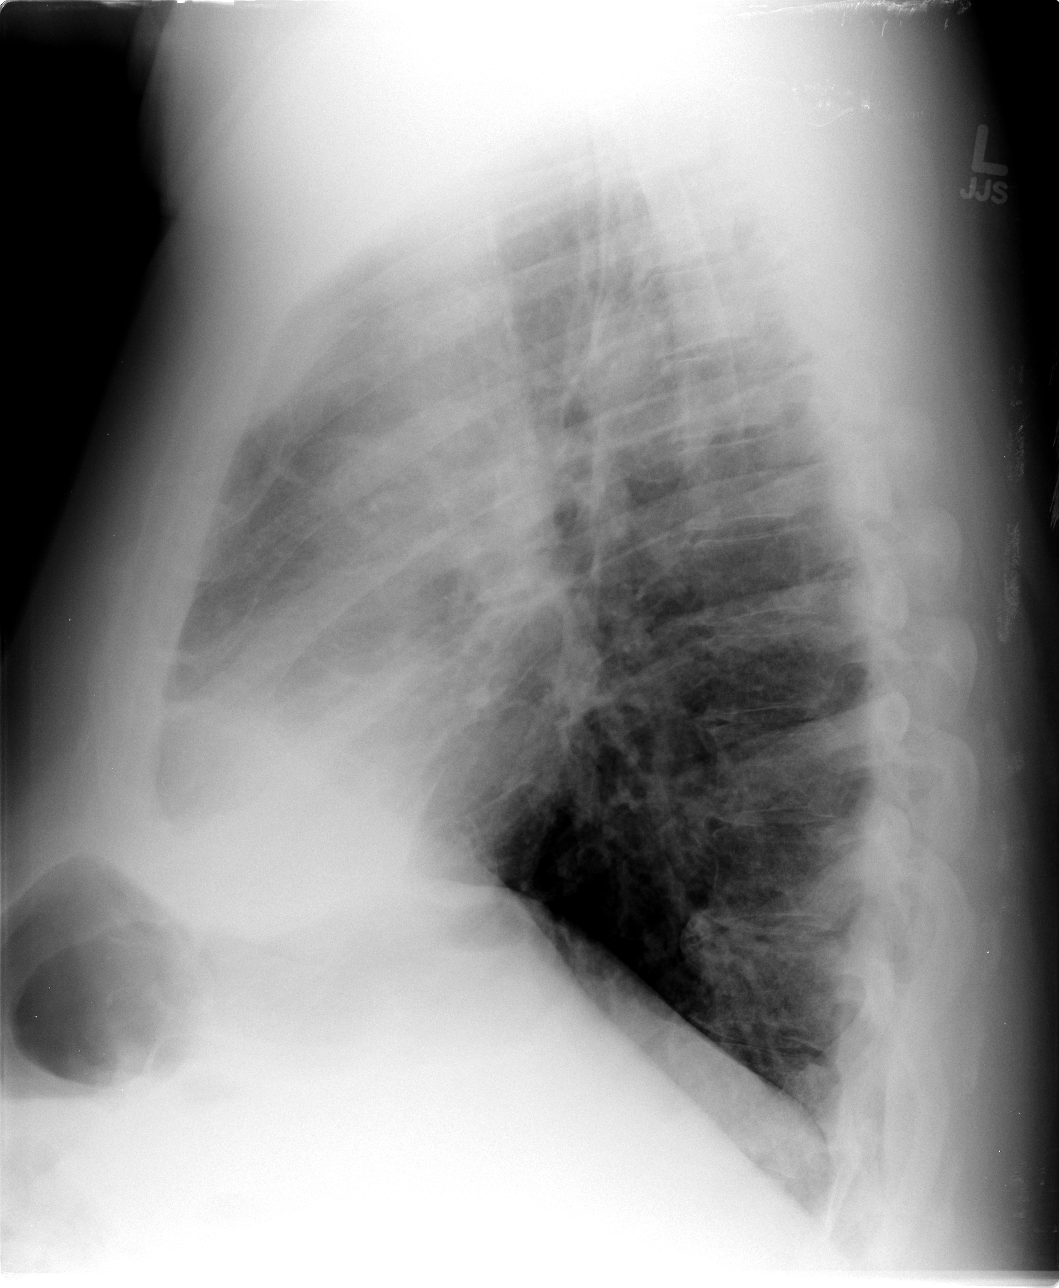

[3 of 3 positions shown; findings below may reference images not displayed]

FINDINGS: No active infiltrate or effusion is seen.  Prominent hila
particular on the left.  Be due to pulmonary arterial hypertension
when compared to the prior CT.  Mediastinal contours are stable.
The heart is within normal limits in size.  There is mild
peribronchial thickening present.  No bony abnormality is seen.
IMPRESSION: No active lung disease.  Peribronchial thickening may indicate
bronchitis.

## 2013-05-17 ENCOUNTER — Other Ambulatory Visit: Payer: Self-pay | Admitting: Family Medicine

## 2013-06-05 ENCOUNTER — Telehealth: Payer: Self-pay | Admitting: Family Medicine

## 2013-06-05 MED ORDER — HYDROCODONE-HOMATROPINE 5-1.5 MG/5ML PO SYRP: 5.0000 mL | ORAL_SOLUTION | Freq: Three times a day (TID) | ORAL | Status: DC | PRN

## 2013-06-05 MED ORDER — AZITHROMYCIN 250 MG PO TABS: ORAL_TABLET | ORAL | Status: DC

## 2013-06-05 NOTE — Telephone Encounter (Signed)
Rx printed left up front. LMOVM for pt's wife that this has to be picked up it can not be called in.

## 2013-06-05 NOTE — Telephone Encounter (Signed)
Pt wife is calling today because her husband is sick and she states that Dr Tanya Nones knows there families history she states that her son was just seen Friday and Theodis is having the same symptoms and was wondering if poss could have a zpack and cough syurp called in he does have apt on Friday at 1046 Call back number is [phone]

## 2013-06-05 NOTE — Telephone Encounter (Signed)
Dennis Vasquez was supposed to call in a z-pack.  Patient can also have hycodan 5 ml poq 6 hrs prn.

## 2013-06-05 NOTE — Telephone Encounter (Signed)
Please call out patient a z-pack.

## 2013-06-05 NOTE — Telephone Encounter (Signed)
Pts wife has called back and said that her husband is needing the cough syrup because he has been coughing so bad Call back number is [phone]

## 2013-06-05 NOTE — Telephone Encounter (Signed)
Left mess about Z-pak

## 2013-06-06 ENCOUNTER — Telehealth: Payer: Self-pay | Admitting: Family Medicine

## 2013-06-06 NOTE — Telephone Encounter (Signed)
Dennis Vasquez is called last yesterday evening and left a voicemail stating that she did want to get that cough syrup for Dennis Vasquez and she will be here this morning to get it Call back number [phone]

## 2013-06-08 ENCOUNTER — Encounter: Payer: Self-pay | Admitting: Family Medicine

## 2013-06-08 ENCOUNTER — Ambulatory Visit (INDEPENDENT_AMBULATORY_CARE_PROVIDER_SITE_OTHER): Payer: BC Managed Care – PPO | Admitting: Family Medicine

## 2013-06-08 VITALS — BP 142/80 | HR 86 | Temp 97.0°F | Resp 22 | Wt 306.0 lb

## 2013-06-08 DIAGNOSIS — J209 Acute bronchitis, unspecified: Secondary | ICD-10-CM

## 2013-06-08 MED ORDER — INSULIN GLARGINE 100 UNIT/ML SOLOSTAR PEN: PEN_INJECTOR | SUBCUTANEOUS | Status: DC

## 2013-06-08 MED ORDER — PREDNISONE 20 MG PO TABS: ORAL_TABLET | ORAL | Status: DC
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
## 2013-06-21 ENCOUNTER — Other Ambulatory Visit: Payer: Self-pay | Admitting: Family Medicine

## 2013-07-09 ENCOUNTER — Other Ambulatory Visit: Payer: Self-pay | Admitting: Family Medicine

## 2013-07-09 MED ORDER — GLUCOSE BLOOD VI STRP: ORAL_STRIP | Status: DC

## 2013-07-09 MED ORDER — INSULIN GLARGINE 100 UNIT/ML SOLOSTAR PEN: PEN_INJECTOR | SUBCUTANEOUS | Status: DC

## 2013-07-24 ENCOUNTER — Other Ambulatory Visit: Payer: Self-pay | Admitting: Family Medicine

## 2013-08-17 ENCOUNTER — Other Ambulatory Visit: Payer: Self-pay | Admitting: Family Medicine

## 2013-08-18 ENCOUNTER — Encounter: Payer: Self-pay | Admitting: Family Medicine

## 2013-09-17 ENCOUNTER — Other Ambulatory Visit: Payer: 59

## 2013-09-17 ENCOUNTER — Other Ambulatory Visit: Payer: Self-pay | Admitting: Family Medicine

## 2013-09-17 DIAGNOSIS — Z0000 Encounter for general adult medical examination without abnormal findings: Secondary | ICD-10-CM

## 2013-09-17 DIAGNOSIS — E785 Hyperlipidemia, unspecified: Secondary | ICD-10-CM

## 2013-09-17 DIAGNOSIS — I10 Essential (primary) hypertension: Secondary | ICD-10-CM

## 2013-09-17 DIAGNOSIS — E119 Type 2 diabetes mellitus without complications: Secondary | ICD-10-CM

## 2013-09-17 LAB — COMPREHENSIVE METABOLIC PANEL
ALK PHOS: 61 U/L (ref 39–117)
ALT: 30 U/L (ref 0–53)
AST: 20 U/L (ref 0–37)
Albumin: 4.1 g/dL (ref 3.5–5.2)
BILIRUBIN TOTAL: 0.6 mg/dL (ref 0.2–1.2)
BUN: 16 mg/dL (ref 6–23)
CO2: 26 meq/L (ref 19–32)
CREATININE: 1.19 mg/dL (ref 0.50–1.35)
Calcium: 9.1 mg/dL (ref 8.4–10.5)
Chloride: 102 mEq/L (ref 96–112)
Glucose, Bld: 117 mg/dL — ABNORMAL HIGH (ref 70–99)
Potassium: 4 mEq/L (ref 3.5–5.3)
SODIUM: 141 meq/L (ref 135–145)
TOTAL PROTEIN: 6.5 g/dL (ref 6.0–8.3)

## 2013-09-17 LAB — CBC WITH DIFFERENTIAL/PLATELET
BASOS ABS: 0 10*3/uL (ref 0.0–0.1)
BASOS PCT: 0 % (ref 0–1)
EOS ABS: 0.2 10*3/uL (ref 0.0–0.7)
Eosinophils Relative: 2 % (ref 0–5)
HCT: 45.2 % (ref 39.0–52.0)
Hemoglobin: 16 g/dL (ref 13.0–17.0)
LYMPHS ABS: 2.3 10*3/uL (ref 0.7–4.0)
Lymphocytes Relative: 26 % (ref 12–46)
MCH: 30.4 pg (ref 26.0–34.0)
MCHC: 35.4 g/dL (ref 30.0–36.0)
MCV: 85.8 fL (ref 78.0–100.0)
Monocytes Absolute: 0.7 10*3/uL (ref 0.1–1.0)
Monocytes Relative: 8 % (ref 3–12)
NEUTROS PCT: 64 % (ref 43–77)
Neutro Abs: 5.7 10*3/uL (ref 1.7–7.7)
PLATELETS: 268 10*3/uL (ref 150–400)
RBC: 5.27 MIL/uL (ref 4.22–5.81)
RDW: 14.1 % (ref 11.5–15.5)
WBC: 8.9 10*3/uL (ref 4.0–10.5)

## 2013-09-17 LAB — LIPID PANEL
CHOLESTEROL: 154 mg/dL (ref 0–200)
HDL: 45 mg/dL (ref >39)
LDL CALC: 81 mg/dL (ref 0–99)
TRIGLYCERIDES: 141 mg/dL (ref <150)
Total CHOL/HDL Ratio: 3.4 Ratio
VLDL: 28 mg/dL (ref 0–40)

## 2013-09-17 LAB — HEMOGLOBIN A1C
Hgb A1c MFr Bld: 6.7 % — ABNORMAL HIGH (ref <5.7)
Mean Plasma Glucose: 146 mg/dL — ABNORMAL HIGH (ref <117)

## 2013-09-17 LAB — PSA: PSA: 0.62 ng/mL (ref <=4.00)

## 2013-09-24 ENCOUNTER — Ambulatory Visit (INDEPENDENT_AMBULATORY_CARE_PROVIDER_SITE_OTHER): Payer: 59 | Admitting: Family Medicine

## 2013-09-24 ENCOUNTER — Encounter: Payer: Self-pay | Admitting: Family Medicine

## 2013-09-24 VITALS — BP 130/92 | HR 86 | Temp 97.3°F | Resp 18 | Ht 75.0 in | Wt 317.0 lb

## 2013-09-24 DIAGNOSIS — I10 Essential (primary) hypertension: Secondary | ICD-10-CM

## 2013-09-24 DIAGNOSIS — Z23 Encounter for immunization: Secondary | ICD-10-CM

## 2013-09-24 DIAGNOSIS — Z79899 Other long term (current) drug therapy: Secondary | ICD-10-CM

## 2013-09-24 DIAGNOSIS — E119 Type 2 diabetes mellitus without complications: Secondary | ICD-10-CM

## 2013-09-24 DIAGNOSIS — R5381 Other malaise: Secondary | ICD-10-CM

## 2013-09-24 DIAGNOSIS — Z0000 Encounter for general adult medical examination without abnormal findings: Secondary | ICD-10-CM

## 2013-09-24 DIAGNOSIS — R5383 Other fatigue: Principal | ICD-10-CM

## 2013-09-24 DIAGNOSIS — E785 Hyperlipidemia, unspecified: Secondary | ICD-10-CM

## 2013-09-24 LAB — TESTOSTERONE: TESTOSTERONE: 204 ng/dL — AB (ref 300–890)

## 2013-09-24 MED ORDER — PRAVASTATIN SODIUM 40 MG PO TABS: 40.0000 mg | ORAL_TABLET | Freq: Every day | ORAL | Status: DC

## 2013-09-24 MED ORDER — ZOLPIDEM TARTRATE 10 MG PO TABS: 10.0000 mg | ORAL_TABLET | Freq: Every evening | ORAL | Status: DC | PRN

## 2013-09-24 MED ORDER — INSULIN GLARGINE 100 UNIT/ML SOLOSTAR PEN: PEN_INJECTOR | SUBCUTANEOUS | Status: DC

## 2013-09-24 MED ORDER — INSULIN PEN NEEDLE 32G X 4 MM MISC: Status: DC
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
## 2013-09-25 ENCOUNTER — Telehealth: Payer: Self-pay | Admitting: Family Medicine

## 2013-09-25 DIAGNOSIS — Z79899 Other long term (current) drug therapy: Secondary | ICD-10-CM

## 2013-09-25 DIAGNOSIS — E291 Testicular hypofunction: Secondary | ICD-10-CM

## 2013-09-25 MED ORDER — TESTOSTERONE CYPIONATE 200 MG/ML IM SOLN: 200.0000 mg | INTRAMUSCULAR | Status: DC

## 2013-09-25 NOTE — Telephone Encounter (Signed)
Message copied by Olena Mater on Tue Sep 25, 2013 10:01 AM ------      Message from: Jenna Luo      Created: Tue Sep 25, 2013  7:34 AM       Patient's testosterone level is low. I recommend beginning testosterone cypionate 200 mg IM every 2 weeks and recheck labs at office visit in 3 months.  I am hoping that this will help improve his fatigue so that he can exercise more to hopefully lose weight to help his back pain and leg pain. ------

## 2013-09-25 NOTE — Telephone Encounter (Signed)
Left mess for patient to call back.  RX to pharmacy and 3 mth lab ordered

## 2013-09-26 ENCOUNTER — Telehealth: Payer: Self-pay | Admitting: *Deleted

## 2013-09-26 DIAGNOSIS — Z79899 Other long term (current) drug therapy: Secondary | ICD-10-CM

## 2013-09-26 DIAGNOSIS — E291 Testicular hypofunction: Secondary | ICD-10-CM

## 2013-09-26 NOTE — Telephone Encounter (Signed)
Received fax from pharmacy requesting PA for Testosterone.  PA submitted.

## 2013-09-27 MED ORDER — TESTOSTERONE CYPIONATE 200 MG/ML IM SOLN: 200.0000 mg | INTRAMUSCULAR | Status: DC

## 2013-09-27 NOTE — Telephone Encounter (Signed)
Received fax from pharmacy requesting to send 90 day supply instead of 1 month supply.   MD approved and faxed back.

## 2013-10-03 NOTE — Telephone Encounter (Signed)
Spoke to wife about testosterone injection and order at pharmacy.  Pt can be taught how to do at home or we can give here at office.  Then need repeat lab three months after starting.

## 2013-10-16 ENCOUNTER — Other Ambulatory Visit: Payer: Self-pay | Admitting: Family Medicine

## 2013-10-17 ENCOUNTER — Telehealth: Payer: Self-pay | Admitting: *Deleted

## 2013-10-17 NOTE — Telephone Encounter (Signed)
Medication refilled per protocol. 

## 2013-10-17 NOTE — Telephone Encounter (Signed)
Received fax from insurance company regarding PA for testosterone.   PA denied.   States that patient must meet eligibility in one of the following: 1. (2) pre- treatment serum total testosterone lower than 280- patient has had (1) lab with levels noted at 204, 2. (1) pre- treatment calculated free or bioavailable level less than 5ng/dL,  3. History of orchiectomy, panyhypopituitarism, orgenetic disorder known to cause hypogonadism (congenital anorchia, Klinefelter's syndrome).  MD to be made aware.

## 2013-10-18 NOTE — Telephone Encounter (Signed)
Notify patient we will have to draw a second testosterone because of his insurance.

## 2013-10-19 NOTE — Telephone Encounter (Signed)
LMTRC

## 2013-10-22 NOTE — Telephone Encounter (Signed)
LMTRC

## 2013-10-23 NOTE — Telephone Encounter (Signed)
LMTRC

## 2013-10-24 ENCOUNTER — Encounter: Payer: Self-pay | Admitting: *Deleted

## 2013-10-24 NOTE — Telephone Encounter (Signed)
Letter sent with information regarding denial and need for another lab.

## 2013-11-21 ENCOUNTER — Other Ambulatory Visit: Payer: Self-pay | Admitting: Family Medicine

## 2013-12-06 ENCOUNTER — Other Ambulatory Visit: Payer: Self-pay | Admitting: Family Medicine

## 2013-12-27 ENCOUNTER — Other Ambulatory Visit: Payer: Self-pay | Admitting: Family Medicine

## 2014-01-12 ENCOUNTER — Other Ambulatory Visit: Payer: Self-pay | Admitting: *Deleted

## 2014-01-12 DIAGNOSIS — E785 Hyperlipidemia, unspecified: Secondary | ICD-10-CM

## 2014-01-12 DIAGNOSIS — E119 Type 2 diabetes mellitus without complications: Secondary | ICD-10-CM

## 2014-01-12 DIAGNOSIS — I10 Essential (primary) hypertension: Secondary | ICD-10-CM

## 2014-02-02 ENCOUNTER — Other Ambulatory Visit: Payer: Self-pay | Admitting: Family Medicine

## 2014-02-04 ENCOUNTER — Ambulatory Visit (INDEPENDENT_AMBULATORY_CARE_PROVIDER_SITE_OTHER): Payer: 59 | Admitting: Family Medicine

## 2014-02-04 ENCOUNTER — Encounter: Payer: Self-pay | Admitting: Family Medicine

## 2014-02-04 VITALS — BP 150/90 | HR 80 | Temp 97.3°F | Resp 16 | Ht 75.0 in | Wt 323.0 lb

## 2014-02-04 DIAGNOSIS — I10 Essential (primary) hypertension: Secondary | ICD-10-CM

## 2014-02-04 DIAGNOSIS — M5430 Sciatica, unspecified side: Secondary | ICD-10-CM

## 2014-02-04 DIAGNOSIS — M5431 Sciatica, right side: Secondary | ICD-10-CM

## 2014-02-04 DIAGNOSIS — E785 Hyperlipidemia, unspecified: Secondary | ICD-10-CM

## 2014-02-04 DIAGNOSIS — E119 Type 2 diabetes mellitus without complications: Secondary | ICD-10-CM

## 2014-02-04 MED ORDER — OXYCODONE-ACETAMINOPHEN 5-325 MG PO TABS: 1.0000 | ORAL_TABLET | Freq: Three times a day (TID) | ORAL | Status: DC | PRN

## 2014-02-04 MED ORDER — PREDNISONE 20 MG PO TABS: ORAL_TABLET | ORAL | Status: DC

## 2014-02-04 NOTE — Telephone Encounter (Signed)
Refill appropriate and filled per protocol. 
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
## 2014-02-08 ENCOUNTER — Telehealth: Payer: Self-pay | Admitting: Family Medicine

## 2014-02-08 DIAGNOSIS — M5431 Sciatica, right side: Secondary | ICD-10-CM

## 2014-02-08 NOTE — Telephone Encounter (Signed)
Patients wife is calling to let dr pickard know that the prednisone that we prescribed is working, and you told him if it was working that we could call in a second dose of this  [facility]

## 2014-02-11 ENCOUNTER — Other Ambulatory Visit: Payer: 59

## 2014-02-11 DIAGNOSIS — Z79899 Other long term (current) drug therapy: Secondary | ICD-10-CM

## 2014-02-11 DIAGNOSIS — I10 Essential (primary) hypertension: Secondary | ICD-10-CM

## 2014-02-11 DIAGNOSIS — E119 Type 2 diabetes mellitus without complications: Secondary | ICD-10-CM

## 2014-02-11 DIAGNOSIS — E785 Hyperlipidemia, unspecified: Secondary | ICD-10-CM

## 2014-02-11 LAB — LIPID PANEL
Cholesterol: 152 mg/dL (ref 0–200)
HDL: 59 mg/dL (ref >39)
LDL CALC: 68 mg/dL (ref 0–99)
TRIGLYCERIDES: 124 mg/dL (ref <150)
Total CHOL/HDL Ratio: 2.6 Ratio
VLDL: 25 mg/dL (ref 0–40)

## 2014-02-11 LAB — CBC WITH DIFFERENTIAL/PLATELET
Basophils Absolute: 0 10*3/uL (ref 0.0–0.1)
Basophils Relative: 0 % (ref 0–1)
Eosinophils Absolute: 0.2 10*3/uL (ref 0.0–0.7)
Eosinophils Relative: 2 % (ref 0–5)
HCT: 48.1 % (ref 39.0–52.0)
Hemoglobin: 16.2 g/dL (ref 13.0–17.0)
LYMPHS PCT: 19 % (ref 12–46)
Lymphs Abs: 2.4 10*3/uL (ref 0.7–4.0)
MCH: 29.2 pg (ref 26.0–34.0)
MCHC: 33.7 g/dL (ref 30.0–36.0)
MCV: 86.7 fL (ref 78.0–100.0)
Monocytes Absolute: 0.9 10*3/uL (ref 0.1–1.0)
Monocytes Relative: 7 % (ref 3–12)
NEUTROS PCT: 72 % (ref 43–77)
Neutro Abs: 8.9 10*3/uL — ABNORMAL HIGH (ref 1.7–7.7)
PLATELETS: 295 10*3/uL (ref 150–400)
RBC: 5.55 MIL/uL (ref 4.22–5.81)
RDW: 14.4 % (ref 11.5–15.5)
WBC: 12.4 10*3/uL — ABNORMAL HIGH (ref 4.0–10.5)

## 2014-02-11 LAB — HEMOGLOBIN A1C
HEMOGLOBIN A1C: 6.8 % — AB (ref <5.7)
Mean Plasma Glucose: 148 mg/dL — ABNORMAL HIGH (ref <117)

## 2014-02-11 LAB — COMPLETE METABOLIC PANEL WITH GFR
ALT: 35 U/L (ref 0–53)
AST: 21 U/L (ref 0–37)
Albumin: 4.2 g/dL (ref 3.5–5.2)
Alkaline Phosphatase: 65 U/L (ref 39–117)
BILIRUBIN TOTAL: 0.5 mg/dL (ref 0.2–1.2)
BUN: 22 mg/dL (ref 6–23)
CHLORIDE: 102 meq/L (ref 96–112)
CO2: 29 meq/L (ref 19–32)
CREATININE: 1.16 mg/dL (ref 0.50–1.35)
Calcium: 9.1 mg/dL (ref 8.4–10.5)
GFR, EST AFRICAN AMERICAN: 76 mL/min
GFR, EST NON AFRICAN AMERICAN: 66 mL/min
Glucose, Bld: 144 mg/dL — ABNORMAL HIGH (ref 70–99)
Potassium: 4.3 mEq/L (ref 3.5–5.3)
Sodium: 142 mEq/L (ref 135–145)
TOTAL PROTEIN: 6.4 g/dL (ref 6.0–8.3)

## 2014-02-11 LAB — MICROALBUMIN, URINE: Microalb, Ur: 4.35 mg/dL — ABNORMAL HIGH (ref 0.00–1.89)

## 2014-02-12 LAB — TESTOSTERONE: Testosterone: 265 ng/dL — ABNORMAL LOW (ref 300–890)

## 2014-02-13 MED ORDER — PREDNISONE 20 MG PO TABS: ORAL_TABLET | ORAL | Status: DC

## 2014-02-13 NOTE — Telephone Encounter (Signed)
?   OK to Refill  

## 2014-02-15 ENCOUNTER — Other Ambulatory Visit: Payer: Self-pay | Admitting: *Deleted

## 2014-02-15 MED ORDER — AMLODIPINE BESY-BENAZEPRIL HCL 10-40 MG PO CAPS: 1.0000 | ORAL_CAPSULE | Freq: Every day | ORAL | Status: DC

## 2014-02-15 MED ORDER — LOSARTAN POTASSIUM 50 MG PO TABS: 50.0000 mg | ORAL_TABLET | Freq: Every day | ORAL | Status: DC

## 2014-02-15 NOTE — Telephone Encounter (Signed)
Per [facility] ok to call in another dose pack. Med called into pharm. Also pt is to increase Lotrel to 10/40 and not take Losartan. Pts wife aware and med sent to pham.

## 2014-02-28 ENCOUNTER — Telehealth: Payer: Self-pay | Admitting: Family Medicine

## 2014-02-28 DIAGNOSIS — M549 Dorsalgia, unspecified: Secondary | ICD-10-CM

## 2014-02-28 NOTE — Telephone Encounter (Signed)
ok 

## 2014-02-28 NOTE — Telephone Encounter (Signed)
[phone]  Pt wife has called and stated pt finished 2nd dose of steroids and he is not better and would like to go ahead with the MRI if that is okay. He would like to go to [place] so he can use the big open machine since he big.

## 2014-02-28 NOTE — Telephone Encounter (Signed)
OK to schedule

## 2014-03-01 NOTE — Telephone Encounter (Signed)
MRI ordered and pt aware per vm

## 2014-03-06 ENCOUNTER — Telehealth: Payer: Self-pay | Admitting: Family Medicine

## 2014-03-06 DIAGNOSIS — M5431 Sciatica, right side: Secondary | ICD-10-CM

## 2014-03-06 NOTE — Telephone Encounter (Signed)
?   OK to Refill  

## 2014-03-06 NOTE — Telephone Encounter (Signed)
Patient is calling to get refill on pain medication  [phone] he is still working and needs this pain medication until his mri

## 2014-03-07 ENCOUNTER — Other Ambulatory Visit: Payer: Self-pay | Admitting: Family Medicine

## 2014-03-07 DIAGNOSIS — M5431 Sciatica, right side: Secondary | ICD-10-CM

## 2014-03-07 MED ORDER — OXYCODONE-ACETAMINOPHEN 5-325 MG PO TABS: 1.0000 | ORAL_TABLET | Freq: Three times a day (TID) | ORAL | Status: DC | PRN

## 2014-03-07 NOTE — Telephone Encounter (Signed)
ok 

## 2014-03-07 NOTE — Telephone Encounter (Signed)
RX printed, left up front and patient aware to pick up per vm 

## 2014-03-09 ENCOUNTER — Ambulatory Visit
Admission: RE | Admit: 2014-03-09 | Discharge: 2014-03-09 | Disposition: A | Discharge status: Home / Self Care | Payer: 59 | Source: Ambulatory Visit | Attending: Family Medicine | Admitting: Family Medicine

## 2014-03-09 DIAGNOSIS — M549 Dorsalgia, unspecified: Secondary | ICD-10-CM

## 2014-03-13 ENCOUNTER — Other Ambulatory Visit: Payer: Self-pay | Admitting: Family Medicine

## 2014-03-13 DIAGNOSIS — M545 Low back pain: Secondary | ICD-10-CM

## 2014-04-09 ENCOUNTER — Other Ambulatory Visit: Payer: Self-pay | Admitting: Family Medicine

## 2014-04-10 ENCOUNTER — Other Ambulatory Visit: Payer: Self-pay | Admitting: Family Medicine

## 2014-04-11 NOTE — Telephone Encounter (Signed)
Refill appropriate and filled per protocol. 

## 2014-04-15 ENCOUNTER — Ambulatory Visit: Payer: 59 | Admitting: Physician Assistant

## 2014-04-16 ENCOUNTER — Ambulatory Visit (INDEPENDENT_AMBULATORY_CARE_PROVIDER_SITE_OTHER): Payer: 59 | Admitting: Physician Assistant

## 2014-04-16 ENCOUNTER — Encounter: Payer: Self-pay | Admitting: Physician Assistant

## 2014-04-16 VITALS — BP 170/82 | HR 78 | Temp 97.6°F | Resp 20 | Wt 328.0 lb

## 2014-04-16 DIAGNOSIS — B9689 Other specified bacterial agents as the cause of diseases classified elsewhere: Principal | ICD-10-CM

## 2014-04-16 DIAGNOSIS — I10 Essential (primary) hypertension: Secondary | ICD-10-CM

## 2014-04-16 DIAGNOSIS — J988 Other specified respiratory disorders: Secondary | ICD-10-CM

## 2014-04-16 DIAGNOSIS — K219 Gastro-esophageal reflux disease without esophagitis: Secondary | ICD-10-CM

## 2014-04-16 MED ORDER — OMEPRAZOLE 20 MG PO CPDR: 20.0000 mg | DELAYED_RELEASE_CAPSULE | Freq: Every day | ORAL | Status: DC

## 2014-04-16 MED ORDER — AZITHROMYCIN 250 MG PO TABS: ORAL_TABLET | ORAL | Status: DC

## 2014-04-16 MED ORDER — NEBIVOLOL HCL 5 MG PO TABS: 5.0000 mg | ORAL_TABLET | Freq: Every day | ORAL | Status: DC

## 2014-04-16 MED ORDER — HYDROCODONE-HOMATROPINE 5-1.5 MG/5ML PO SYRP: 5.0000 mL | ORAL_SOLUTION | Freq: Three times a day (TID) | ORAL | Status: DC | PRN

## 2014-04-16 NOTE — Progress Notes (Signed)
Patient ID: Dennis Vasquez MRN: [id number], DOB: January 30, 1946, 68 y.o. Date of Encounter: 04/16/2014, 1:00 PM    Chief Complaint:  Chief Complaint  Patient presents with  . cough/chest cong x 2 weeks    tried OTC mucinex/nyquil     HPI: 68 y.o. year old white male reports that he has had cough and chest congestion for about 2 weeks now. Says that he really has not had any head or nasal congestion and is getting the mucus from his nose. Has had no sore throat and no fevers or chills.  He is also requesting a prescription for some medication to use for heartburn. Says that he has recently used some heartburn medication and his symptoms resolved and were well-controlled with that but wants a prescription.  No other complaints.     Home Meds:   Outpatient Prescriptions Prior to Visit  Medication Sig Dispense Refill  . amLODipine-benazepril (LOTREL) 10-40 MG per capsule Take 1 capsule by mouth daily.  90 capsule  3  . aspirin 81 MG tablet Take 81 mg by mouth daily.        . cloNIDine (CATAPRES) 0.3 MG tablet TAKE 1 TABLET BY MOUTH THREE TIMES DAILY  270 tablet  0  . fish oil-omega-3 fatty acids 1000 MG capsule Take 2 g by mouth 2 (two) times daily.        . furosemide (LASIX) 40 MG tablet TAKE 1  TABLET BY MOUTH TWICE DAILY      . insulin lispro (HUMALOG) 100 UNIT/ML injection As directed      . Insulin Pen Needle 32G X 4 MM MISC Inject 4 x a day  100 each  5  . KLOR-CON M20 20 MEQ tablet TAKE 1 TABLET BY MOUTH TWICE A DAY  180 tablet  0  . LANTUS SOLOSTAR 100 UNIT/ML Solostar Pen INJECT 60 UNITS IN THE SKIN EVERY MORNING  15 mL  11  . Multiple Vitamin (MULTIVITAMIN) tablet Take 1 tablet by mouth daily.        . ONE TOUCH ULTRA TEST test strip TEST BLOOD SUGAR THREE TIMES DAILY  100 each  6  . oxyCODONE-acetaminophen (ROXICET) 5-325 MG per tablet Take 1 tablet by mouth every 8 (eight) hours as needed for severe pain.  30 tablet  0  . pravastatin (PRAVACHOL) 40 MG tablet Take 1  tablet (40 mg total) by mouth daily.  90 tablet  4  . vardenafil (LEVITRA) 20 MG tablet Take 1 tablet (20 mg total) by mouth daily as needed. For erictile dysfuntion  10 tablet  5  . zolpidem (AMBIEN) 10 MG tablet Take 1 tablet (10 mg total) by mouth at bedtime as needed. For sleep  30 tablet  2  . potassium chloride SA (K-DUR,KLOR-CON) 20 MEQ tablet TAKE 1 TABLET BY MOUTH TWICE DAILY  180 tablet  3  . predniSONE (DELTASONE) 20 MG tablet 3 tabs poqday 1-2, 2 tabs poqday 3-4, 1 tab poqday 5-6  12 tablet  0   No facility-administered medications prior to visit.    Allergies: No Known Allergies    Review of Systems: See HPI for pertinent ROS. All other ROS negative.    Physical Exam: Blood pressure 170/82, pulse 78, temperature 97.6 F (36.4 C), temperature source Oral, resp. rate 20, weight 328 lb (148.78 kg), SpO2 96.00%., Body mass index is 41 kg/(m^2). General:  Obese WM. Appears in no acute distress. HEENT: Normocephalic, atraumatic, eyes without discharge, sclera non-icteric, nares are without discharge.  Bilateral auditory canals clear, TM's are without perforation, pearly grey and translucent with reflective cone of light bilaterally. Oral cavity moist, posterior pharynx without exudate, erythema, peritonsillar abscess. No tenderness with percussion of frontal and maxillary sinuses bilaterally.  Neck: Supple. No thyromegaly. No lymphadenopathy. Lungs: Clear bilaterally to auscultation without wheezes, rales, or rhonchi. Breathing is unlabored. Heart: Regular rhythm. No murmurs, rubs, or gallops. Abdomen: Soft nontender nondistended. Msk:  Strength and tone normal for age. Extremities/Skin: Warm and dry. No edema. Neuro: Alert and oriented X 3. Moves all extremities spontaneously. Gait is normal. CNII-XII grossly in tact. Psych:  Responds to questions appropriately with a normal affect.     ASSESSMENT AND PLAN:  68 y.o. year old male with  1. Bacterial respiratory infection -  azithromycin (ZITHROMAX) 250 MG tablet; Day 1: Take 2 daily.  Days 2-5: Take 1 daily.  Dispense: 6 tablet; Refill: 0 - HYDROcodone-homatropine (HYCODAN) 5-1.5 MG/5ML syrup; Take 5 mLs by mouth every 8 (eight) hours as needed for cough.  Dispense: 120 mL; Refill: 0  Take antibiotic as directed and complete it. Followup if symptoms do not resolve within one week after completion of antibiotic. May use prescription cough medicine for cough suppressant especially at night but needs to use an expectorant such as Mucinex DM especially during the day.  2. Gastroesophageal reflux disease, esophagitis presence not specified - omeprazole (PRILOSEC) 20 MG capsule; Take 1 capsule (20 mg total) by mouth daily.  Dispense: 30 capsule; Refill: 3  3. Essential hypertension Discussed with patient the blood pressure is elevated today. I reviewed his last office visit note here which is 02/04/14. At that no Dr. Dennard Schaumann mentioned that his blood pressure was slightly high at 150/90 but also noted that the patient was in pain at that time. He recommended the patient check his blood pressure and followup with him. Patient states that he did check his blood pressure for a while and then called and Dr. Dennard Schaumann increased his Lotrel from 10/20 to 10/40. Patient states that he has not recently been checking his blood pressure. Says that when he was at the back doctor recently they noted that his blood pressure was also high at about 300 systolic. Patient is agreeable that he needs  To adjust meds to control BP.  He is already on the following Norvasc 10 mg Benazepril 40 mg Clonidine 0.3 3 times a day Lasix 40 mg twice a day Will add Bystolic.  I gave him 21 tablets of samples.  He is to take this daily.   Schedule followup office visit here with Dr. Dennard Schaumann in 2 weeks to recheck blood pressure on this medication. - nebivolol (BYSTOLIC) 5 MG tablet; Take 1 tablet (5 mg total) by mouth daily.  Dispense: 30 tablet; Refill:  3   Signed, [address] [place], [place], [facility] 04/16/2014 1:00 PM

## 2014-04-19 ENCOUNTER — Other Ambulatory Visit: Payer: Self-pay

## 2014-04-23 ENCOUNTER — Emergency Department (HOSPITAL_COMMUNITY): Payer: 59

## 2014-04-23 ENCOUNTER — Encounter (HOSPITAL_COMMUNITY): Payer: Self-pay | Admitting: Emergency Medicine

## 2014-04-23 ENCOUNTER — Emergency Department (HOSPITAL_COMMUNITY)
Admission: EM | Admit: 2014-04-23 | Discharge: 2014-04-23 | Disposition: A | Discharge status: Home / Self Care | Payer: 59 | Attending: Emergency Medicine | Admitting: Emergency Medicine

## 2014-04-23 DIAGNOSIS — Z79899 Other long term (current) drug therapy: Secondary | ICD-10-CM | POA: Insufficient documentation

## 2014-04-23 DIAGNOSIS — I10 Essential (primary) hypertension: Secondary | ICD-10-CM | POA: Insufficient documentation

## 2014-04-23 DIAGNOSIS — Z794 Long term (current) use of insulin: Secondary | ICD-10-CM | POA: Diagnosis not present

## 2014-04-23 DIAGNOSIS — R0602 Shortness of breath: Secondary | ICD-10-CM

## 2014-04-23 DIAGNOSIS — G4733 Obstructive sleep apnea (adult) (pediatric): Secondary | ICD-10-CM | POA: Insufficient documentation

## 2014-04-23 DIAGNOSIS — Z9889 Other specified postprocedural states: Secondary | ICD-10-CM | POA: Insufficient documentation

## 2014-04-23 DIAGNOSIS — Z87891 Personal history of nicotine dependence: Secondary | ICD-10-CM | POA: Insufficient documentation

## 2014-04-23 DIAGNOSIS — Z792 Long term (current) use of antibiotics: Secondary | ICD-10-CM | POA: Insufficient documentation

## 2014-04-23 DIAGNOSIS — M1990 Unspecified osteoarthritis, unspecified site: Secondary | ICD-10-CM | POA: Insufficient documentation

## 2014-04-23 DIAGNOSIS — I2510 Atherosclerotic heart disease of native coronary artery without angina pectoris: Secondary | ICD-10-CM | POA: Diagnosis not present

## 2014-04-23 DIAGNOSIS — R5383 Other fatigue: Secondary | ICD-10-CM | POA: Diagnosis not present

## 2014-04-23 DIAGNOSIS — Z8701 Personal history of pneumonia (recurrent): Secondary | ICD-10-CM | POA: Diagnosis not present

## 2014-04-23 DIAGNOSIS — Z87448 Personal history of other diseases of urinary system: Secondary | ICD-10-CM | POA: Insufficient documentation

## 2014-04-23 DIAGNOSIS — E785 Hyperlipidemia, unspecified: Secondary | ICD-10-CM | POA: Diagnosis not present

## 2014-04-23 DIAGNOSIS — Z87828 Personal history of other (healed) physical injury and trauma: Secondary | ICD-10-CM | POA: Diagnosis not present

## 2014-04-23 DIAGNOSIS — E119 Type 2 diabetes mellitus without complications: Secondary | ICD-10-CM | POA: Insufficient documentation

## 2014-04-23 DIAGNOSIS — Z7982 Long term (current) use of aspirin: Secondary | ICD-10-CM | POA: Diagnosis not present

## 2014-04-23 DIAGNOSIS — R110 Nausea: Secondary | ICD-10-CM | POA: Diagnosis present

## 2014-04-23 DIAGNOSIS — Z86010 Personal history of colonic polyps: Secondary | ICD-10-CM | POA: Diagnosis not present

## 2014-04-23 DIAGNOSIS — J441 Chronic obstructive pulmonary disease with (acute) exacerbation: Secondary | ICD-10-CM | POA: Diagnosis not present

## 2014-04-23 LAB — CBC WITH DIFFERENTIAL/PLATELET
BASOS PCT: 0 % (ref 0–1)
Basophils Absolute: 0 10*3/uL (ref 0.0–0.1)
EOS ABS: 0.1 10*3/uL (ref 0.0–0.7)
EOS PCT: 1 % (ref 0–5)
HEMATOCRIT: 42.8 % (ref 39.0–52.0)
Hemoglobin: 14.2 g/dL (ref 13.0–17.0)
Lymphocytes Relative: 10 % — ABNORMAL LOW (ref 12–46)
Lymphs Abs: 1.1 10*3/uL (ref 0.7–4.0)
MCH: 29.8 pg (ref 26.0–34.0)
MCHC: 33.2 g/dL (ref 30.0–36.0)
MCV: 89.9 fL (ref 78.0–100.0)
MONO ABS: 0.9 10*3/uL (ref 0.1–1.0)
MONOS PCT: 8 % (ref 3–12)
Neutro Abs: 8.7 10*3/uL — ABNORMAL HIGH (ref 1.7–7.7)
Neutrophils Relative %: 81 % — ABNORMAL HIGH (ref 43–77)
Platelets: 202 10*3/uL (ref 150–400)
RBC: 4.76 MIL/uL (ref 4.22–5.81)
RDW: 14.1 % (ref 11.5–15.5)
WBC: 10.9 10*3/uL — ABNORMAL HIGH (ref 4.0–10.5)

## 2014-04-23 LAB — COMPREHENSIVE METABOLIC PANEL
ALK PHOS: 45 U/L (ref 39–117)
ALT: 36 U/L (ref 0–53)
AST: 29 U/L (ref 0–37)
Albumin: 3.5 g/dL (ref 3.5–5.2)
Anion gap: 13 (ref 5–15)
BUN: 20 mg/dL (ref 6–23)
CHLORIDE: 101 meq/L (ref 96–112)
CO2: 28 meq/L (ref 19–32)
Calcium: 8.9 mg/dL (ref 8.4–10.5)
Creatinine, Ser: 1.08 mg/dL (ref 0.50–1.35)
GFR calc Af Amer: 81 mL/min — ABNORMAL LOW (ref >90)
GFR, EST NON AFRICAN AMERICAN: 70 mL/min — AB (ref >90)
Glucose, Bld: 187 mg/dL — ABNORMAL HIGH (ref 70–99)
POTASSIUM: 3.6 meq/L — AB (ref 3.7–5.3)
Sodium: 142 mEq/L (ref 137–147)
Total Protein: 6.5 g/dL (ref 6.0–8.3)

## 2014-04-23 LAB — URINALYSIS, ROUTINE W REFLEX MICROSCOPIC
Bilirubin Urine: NEGATIVE
Glucose, UA: 250 mg/dL — AB
HGB URINE DIPSTICK: NEGATIVE
Ketones, ur: NEGATIVE mg/dL
Leukocytes, UA: NEGATIVE
Nitrite: NEGATIVE
PROTEIN: NEGATIVE mg/dL
SPECIFIC GRAVITY, URINE: 1.013 (ref 1.005–1.030)
Urobilinogen, UA: 0.2 mg/dL (ref 0.0–1.0)
pH: 7.5 (ref 5.0–8.0)

## 2014-04-23 LAB — CBG MONITORING, ED: Glucose-Capillary: 136 mg/dL — ABNORMAL HIGH (ref 70–99)

## 2014-04-23 LAB — I-STAT TROPONIN, ED
Troponin i, poc: 0.02 ng/mL (ref 0.00–0.08)
Troponin i, poc: 0.01 ng/mL (ref 0.00–0.08)

## 2014-04-23 LAB — LIPASE, BLOOD: Lipase: 20 U/L (ref 11–59)

## 2014-04-23 LAB — PRO B NATRIURETIC PEPTIDE: Pro B Natriuretic peptide (BNP): 97.5 pg/mL (ref 0–125)

## 2014-04-23 MED ORDER — SODIUM CHLORIDE 0.9 % IV BOLUS (SEPSIS)
250.0000 mL | Freq: Once | INTRAVENOUS | Status: AC
  Administered 2014-04-23: 250 mL via INTRAVENOUS

## 2014-04-23 MED ORDER — SODIUM CHLORIDE 0.9 % IV SOLN
INTRAVENOUS | Status: DC
  Administered 2014-04-23: 09:00:00 via INTRAVENOUS

## 2014-04-23 MED ORDER — ONDANSETRON HCL 4 MG PO TABS: 4.0000 mg | ORAL_TABLET | Freq: Four times a day (QID) | ORAL | Status: DC

## 2014-04-23 MED ORDER — IOHEXOL 350 MG/ML SOLN
100.0000 mL | Freq: Once | INTRAVENOUS | Status: AC | PRN
  Administered 2014-04-23: 100 mL via INTRAVENOUS

## 2014-04-23 MED ORDER — ONDANSETRON HCL 4 MG/2ML IJ SOLN
4.0000 mg | Freq: Once | INTRAMUSCULAR | Status: AC
  Administered 2014-04-23: 4 mg via INTRAVENOUS
  Filled 2014-04-23: qty 2

## 2014-04-23 NOTE — ED Notes (Signed)
Pt c/o nausea that was relieved with Zofran; reports pressure "pushing up in my throat." Abdomen distended, nontender. Denies bowel/bladder issues. Has been taking antibiotics x 5 days for cold symptoms.

## 2014-04-23 NOTE — ED Notes (Signed)
Dr. Zackowski at bedside  

## 2014-04-23 NOTE — ED Notes (Signed)
Pt reports 2 episodes of intermittent chest pain episodes over the past week. Sts being at work, sitting at his desk when he had chest pain radiating into L jaw and posterior neck pain lasting only a "few seconds." Denies chest pain at this time

## 2014-04-23 NOTE — Discharge Instructions (Signed)
Extensive workup today without significant findings. Return for any new or worse symptoms. Make an appointment to followup with your regular doctor. Zofran provided for the nausea. Workup provided so you can rest for couple days.

## 2014-04-23 NOTE — ED Notes (Signed)
Pt to ED from home c/o fatigue and nausea. Reports not feeling well x 1 week. Recently started on antibiotics for cold like symptoms. Pt c/o nausea and bloating. Denies bowel/bladder issues. EMS gave 4mg  Zofran enroute with relief. Pt has not taken home medications this morning
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
## 2014-04-24 ENCOUNTER — Telehealth: Payer: Self-pay | Admitting: Family Medicine

## 2014-04-24 NOTE — Telephone Encounter (Signed)
patinet's wife [name] is calling to let dr pickard and you know that he had to go to er yesterday for elevated blood pressure and this has been ongoing. Before he went to er yesterday it was 220/106. Would like to know which direction he should go in now, would likea call back at [phone]

## 2014-04-25 NOTE — Telephone Encounter (Signed)
LMTRC

## 2014-04-25 NOTE — Telephone Encounter (Signed)
Again clonidine 0.1 mg by mouth twice a day and recheck blood pressure here in one week

## 2014-04-26 MED ORDER — CLONIDINE HCL 0.1 MG PO TABS: 0.1000 mg | ORAL_TABLET | Freq: Two times a day (BID) | ORAL | Status: DC

## 2014-04-26 NOTE — Telephone Encounter (Signed)
Pt was already on Clonidine .3mg  TID along with Norvasc 10 mg , Benazepril 40 mg and MBD just put him on Bystolic 5mg .. His BP has still been elevated but he has also had a lot of back pain and just had an injection in his back. Wife is wanting to know what to do now as he is on so many BP meds that his BP should not be high with that many meds????

## 2014-04-26 NOTE — Telephone Encounter (Signed)
Pt's wife aware via vm and med sent to pharm. Informed on vm that med was sent to pharm and to schedule a f/u appt here in one week.

## 2014-04-28 NOTE — Telephone Encounter (Signed)
If no better, BP may be partially due to pain but I would add HCTZ 2 mg poqday and recheck here in 1 week.

## 2014-04-29 ENCOUNTER — Telehealth: Payer: Self-pay | Admitting: *Deleted

## 2014-04-29 NOTE — Telephone Encounter (Signed)
Spoke to pt's wife and his BP and BS are all over the place and dropping when he stands up. Informed her to do orthostatic BP's and if it drops more then 20 pts please call in the AM to be seen. She verbalizes understanding and instruction on how to do orthostatics BP. Will hold off on HCTZ until she calls me in the morning. WTP aware and agrees.

## 2014-04-29 NOTE — Telephone Encounter (Signed)
lmtrc to pt need to find out who his last ortho doc was so that those records can be sent to [facility] so that they may proceed with referral. Need records from [facility] 2012 and May 2012 faxed to [phone]

## 2014-04-30 ENCOUNTER — Telehealth: Payer: Self-pay | Admitting: Family Medicine

## 2014-04-30 NOTE — Telephone Encounter (Signed)
WTP aware and states that he NTBS. LMOVM to schedule appt to have BP checked.

## 2014-04-30 NOTE — Telephone Encounter (Signed)
Wife Dennis Vasquez calling to talk with you regarding Dennis Vasquez's blood pressure readings  Last readings were 153/84 185/97 195/101 [phone]

## 2014-05-01 ENCOUNTER — Other Ambulatory Visit: Payer: Self-pay | Admitting: Family Medicine

## 2014-05-01 NOTE — Telephone Encounter (Signed)
Pt has appt 05/06/14

## 2014-05-01 NOTE — Telephone Encounter (Signed)
Pts wife aware via vm and pt has appt 05/06/14

## 2014-05-06 ENCOUNTER — Ambulatory Visit (INDEPENDENT_AMBULATORY_CARE_PROVIDER_SITE_OTHER): Payer: 59 | Admitting: Family Medicine

## 2014-05-06 ENCOUNTER — Encounter: Payer: Self-pay | Admitting: Family Medicine

## 2014-05-06 VITALS — BP 120/80 | HR 80 | Temp 97.8°F | Resp 18 | Wt 336.0 lb

## 2014-05-06 DIAGNOSIS — Z23 Encounter for immunization: Secondary | ICD-10-CM

## 2014-05-06 DIAGNOSIS — I10 Essential (primary) hypertension: Secondary | ICD-10-CM
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
## 2014-05-09 ENCOUNTER — Ambulatory Visit (INDEPENDENT_AMBULATORY_CARE_PROVIDER_SITE_OTHER): Payer: 59 | Admitting: Cardiology

## 2014-05-09 ENCOUNTER — Encounter: Payer: Medicare Other | Admitting: Cardiology

## 2014-05-09 ENCOUNTER — Encounter: Payer: Self-pay | Admitting: *Deleted

## 2014-05-09 ENCOUNTER — Encounter: Payer: Self-pay | Admitting: Cardiology

## 2014-05-09 VITALS — BP 131/78 | HR 66 | Ht 75.0 in | Wt 328.0 lb

## 2014-05-09 DIAGNOSIS — R079 Chest pain, unspecified: Secondary | ICD-10-CM | POA: Insufficient documentation

## 2014-05-09 DIAGNOSIS — R0600 Dyspnea, unspecified: Secondary | ICD-10-CM | POA: Insufficient documentation

## 2014-05-09 DIAGNOSIS — R072 Precordial pain: Secondary | ICD-10-CM

## 2014-05-09 DIAGNOSIS — I2510 Atherosclerotic heart disease of native coronary artery without angina pectoris: Secondary | ICD-10-CM

## 2014-05-09 MED ORDER — HYDRALAZINE HCL 50 MG PO TABS: 50.0000 mg | ORAL_TABLET | Freq: Three times a day (TID) | ORAL | Status: DC

## 2014-05-09 NOTE — Assessment & Plan Note (Signed)
Continue aspirin and statin. 

## 2014-05-09 NOTE — Patient Instructions (Signed)
Your physician recommends that you schedule a follow-up appointment in: [place] has requested that you have a lexiscan myoview. For further information please visit [email or web address]. Please follow instruction sheet, as given.   Your physician has requested that you have an echocardiogram. Echocardiography is a painless test that uses sound waves to create images of your heart. It provides your doctor with information about the size and shape of your heart and how well your heart's chambers and valves are working. This procedure takes approximately one hour. There are no restrictions for this procedure.   INCREASE HYDRALAZINE 50 MG ONE TABLET THREE TIMES DAILY= 2 OF THE 25 MG TABLETS THREE TIMES DAILY

## 2014-05-09 NOTE — Assessment & Plan Note (Signed)
Most likely multifactorial including obesity hypoventilation syndrome, obstructive sleep apnea, deconditioning, COPD and diastolic dysfunction. Not volume overloaded on examination. Recent proBNP normal. Recent chest CT showed no pulmonary embolus. Plan to check echocardiogram for LV function.

## 2014-05-09 NOTE — Addendum Note (Signed)
Addended by: Cristopher Estimable on: 05/09/2014 12:31 PM   Modules accepted: Orders

## 2014-05-09 NOTE — Assessment & Plan Note (Signed)
Symptoms are somewhat atypical. Plan Lexiscan nuclear study for risk stratification.

## 2014-05-09 NOTE — Assessment & Plan Note (Signed)
Blood pressure has been elevated at home. Increase hydralazine to 50 mg by mouth 3 times a day. Follow blood pressure and increase medications as needed.

## 2014-05-09 NOTE — Assessment & Plan Note (Signed)
Continue statin. 

## 2014-05-16 ENCOUNTER — Ambulatory Visit: Payer: 59 | Admitting: Physician Assistant

## 2014-05-22 ENCOUNTER — Telehealth: Payer: Self-pay | Admitting: Family Medicine

## 2014-05-22 ENCOUNTER — Telehealth: Payer: Self-pay | Admitting: *Deleted

## 2014-05-22 DIAGNOSIS — R0600 Dyspnea, unspecified: Secondary | ICD-10-CM

## 2014-05-22 DIAGNOSIS — I2510 Atherosclerotic heart disease of native coronary artery without angina pectoris: Secondary | ICD-10-CM

## 2014-05-22 NOTE — Telephone Encounter (Signed)
Left message on pt wife voice mail to return my call

## 2014-05-22 NOTE — Telephone Encounter (Signed)
[phone] Pt wife is rtn your call

## 2014-05-22 NOTE — Telephone Encounter (Signed)
Pt wife called back and stated that pt went to see [facility] Dr. Ronnald Ramp who has already done MRI and 2 injections since referral. Pt did not want to go to [facility] d/t giving them run around.

## 2014-05-22 NOTE — Telephone Encounter (Signed)
See referral note

## 2014-05-22 NOTE — Telephone Encounter (Signed)
Received record of bp readings for the patient, they range from 219-131/93-68. Will forward for dr Stanford Breed review

## 2014-05-22 NOTE — Telephone Encounter (Signed)
Need average BP reading. Kirk Ruths

## 2014-05-23 ENCOUNTER — Telehealth (HOSPITAL_COMMUNITY): Payer: Self-pay

## 2014-05-23 NOTE — Telephone Encounter (Signed)
Per dr Stanford Breed patient to increase hydralazine to 100 mg TID. Left message for dtr to call

## 2014-05-23 NOTE — Telephone Encounter (Signed)
Encounter complete. 

## 2014-05-24 NOTE — Telephone Encounter (Signed)
Returning your call. °

## 2014-05-24 NOTE — Telephone Encounter (Signed)
Left detailed message for Warner Laduca regarding medication change. Asked to call back on Monday to confirm.

## 2014-05-24 NOTE — Telephone Encounter (Signed)
Left message for pt to call.

## 2014-05-24 NOTE — Telephone Encounter (Signed)
Returning your call from yesterday. °

## 2014-05-27 ENCOUNTER — Other Ambulatory Visit: Payer: Self-pay | Admitting: Neurological Surgery

## 2014-05-28 ENCOUNTER — Other Ambulatory Visit (HOSPITAL_COMMUNITY): Payer: Self-pay | Admitting: Cardiology

## 2014-05-28 ENCOUNTER — Ambulatory Visit (HOSPITAL_COMMUNITY)
Admission: RE | Admit: 2014-05-28 | Discharge: 2014-05-28 | Disposition: A | Discharge status: Home / Self Care | Payer: 59 | Source: Ambulatory Visit | Attending: Cardiovascular Disease | Admitting: Cardiovascular Disease

## 2014-05-28 ENCOUNTER — Encounter (HOSPITAL_COMMUNITY): Payer: Self-pay | Admitting: *Deleted

## 2014-05-28 ENCOUNTER — Telehealth: Payer: Self-pay | Admitting: Cardiology

## 2014-05-28 DIAGNOSIS — R0600 Dyspnea, unspecified: Secondary | ICD-10-CM | POA: Insufficient documentation

## 2014-05-28 DIAGNOSIS — Z8249 Family history of ischemic heart disease and other diseases of the circulatory system: Secondary | ICD-10-CM | POA: Insufficient documentation

## 2014-05-28 DIAGNOSIS — R0602 Shortness of breath: Secondary | ICD-10-CM | POA: Diagnosis not present

## 2014-05-28 DIAGNOSIS — I10 Essential (primary) hypertension: Secondary | ICD-10-CM | POA: Insufficient documentation

## 2014-05-28 DIAGNOSIS — E785 Hyperlipidemia, unspecified: Secondary | ICD-10-CM | POA: Diagnosis not present

## 2014-05-28 DIAGNOSIS — Z87891 Personal history of nicotine dependence: Secondary | ICD-10-CM | POA: Diagnosis not present

## 2014-05-28 DIAGNOSIS — R079 Chest pain, unspecified: Secondary | ICD-10-CM | POA: Insufficient documentation

## 2014-05-28 DIAGNOSIS — Z794 Long term (current) use of insulin: Secondary | ICD-10-CM | POA: Diagnosis not present

## 2014-05-28 DIAGNOSIS — R002 Palpitations: Secondary | ICD-10-CM | POA: Insufficient documentation

## 2014-05-28 DIAGNOSIS — E119 Type 2 diabetes mellitus without complications: Secondary | ICD-10-CM | POA: Insufficient documentation

## 2014-05-28 DIAGNOSIS — I2510 Atherosclerotic heart disease of native coronary artery without angina pectoris: Secondary | ICD-10-CM

## 2014-05-28 DIAGNOSIS — I25810 Atherosclerosis of coronary artery bypass graft(s) without angina pectoris: Secondary | ICD-10-CM

## 2014-05-28 DIAGNOSIS — I517 Cardiomegaly: Secondary | ICD-10-CM

## 2014-05-28 DIAGNOSIS — E669 Obesity, unspecified: Secondary | ICD-10-CM | POA: Diagnosis not present

## 2014-05-28 MED ORDER — TECHNETIUM TC 99M SESTAMIBI GENERIC - CARDIOLITE
30.0000 | Freq: Once | INTRAVENOUS | Status: AC | PRN
  Administered 2014-05-28: 30 via INTRAVENOUS

## 2014-05-28 MED ORDER — REGADENOSON 0.4 MG/5ML IV SOLN
0.4000 mg | Freq: Once | INTRAVENOUS | Status: AC
  Administered 2014-05-28: 0.4 mg via INTRAVENOUS

## 2014-05-28 MED ORDER — HYDRALAZINE HCL 100 MG PO TABS: 100.0000 mg | ORAL_TABLET | Freq: Three times a day (TID) | ORAL | Status: DC

## 2014-05-28 MED ORDER — AMINOPHYLLINE 25 MG/ML IV SOLN
100.0000 mg | Freq: Once | INTRAVENOUS | Status: AC
  Administered 2014-05-28: 100 mg via INTRAVENOUS

## 2014-05-28 NOTE — Telephone Encounter (Signed)
Spoke with pt, aware of medication change New script sent to the pharm

## 2014-05-28 NOTE — Procedures (Addendum)
[facility] [address] [place] [address] [phone]  Cardiology Nuclear Med Study  Dennis Vasquez is a 68 y.o. male     MRN : [id number]     DOB: Jun 22, 1946  Procedure Date: 05/28/2014  Nuclear Med Background Indication for Stress Test:  Evaluation for Ischemia, Surgical Clearance and Post Hospital History:  COPD, Emphysema and CAD;Last NUC MPI o n01/16/2013-ischemia;EF=61%;ECHO on 06/06/2012 LVEF =60-65% Cardiac Risk Factors: Family History - CAD, History of Smoking, Hypertension, IDDM Type 2, Lipids, Obesity and RBBB  Symptoms:  Chest Pain, DOE, Fatigue, Palpitations and SOB   Nuclear Pre-Procedure Caffeine/Decaff Intake:  7:00pm NPO After: 5:00am   IV Site: R Forearm  IV 0.9% NS with Angio Cath:  22g  Chest Size (in):  52": IV Started by: Dennis Course, RN  Height: 6\' 3"  (1.905 m)  Cup Size: n/a  BMI:  Body mass index is 41 kg/(m^2). Weight:  328 lb (148.78 kg)   Tech Comments:  n/a    Nuclear Med Study 1 or 2 day study: 2 day  Stress Test Type:  [place] Provider:  Kirk Ruths, MD   Resting Radionuclide: Technetium 55m Sestamibi  Resting Radionuclide Dose: 31.0 mCi   Stress Radionuclide:  Technetium 87m Sestamibi  Stress Radionuclide Dose: 30.1 mCi           Stress Protocol Rest HR: 69 Stress HR: 78  Rest BP: 117/72 Stress BP: 89/68  Exercise Time (min): n/a METS: n/a   Predicted Max HR: 154 bpm % Max HR: 57.14 bpm Rate Pressure Product: 12232  Dose of Adenosine (mg):  n/a Dose of Lexiscan: 0.4 mg  Dose of Atropine (mg): n/a Dose of Dobutamine: n/a mcg/kg/min (at max HR)  Stress Test Technologist: Dennis Vasquez, CCT Nuclear Technologist: Dennis Vasquez, CNMT   Rest Procedure:  Myocardial perfusion imaging was performed at rest 45 minutes following the intravenous administration of Technetium 2m Sestamibi. Stress Procedure:  The patient received IV Lexiscan 0.4 mg over  15-seconds.  Technetium 22m Sestamibi injected IV at 30-seconds.  Patient experienced marked SOB and 100 mg Aminophylline IV was administered.  There were no significant changes with Lexiscan.  Quantitative spect images were obtained after a 45 minute delay.  Transient Ischemic Dilatation (Normal <1.22):  0.76  QGS EDV:  142 ml QGS ESV:  46 ml LV Ejection Fraction: 68%     Rest ECG: NSR - Normal EKG  Stress ECG: No significant change from baseline ECG  QPS Raw Data Images:  Normal; no motion artifact; normal heart/lung ratio. Stress Images:  There is decreased uptake in the inferior wall. Rest Images:  There is decreased uptake in the inferior wall. Subtraction (SDS):  There is a fixed inferior defect that is most consistent with diaphragmatic attenuation.  Impression Exercise Capacity:  Lexiscan with no exercise. BP Response:  Normal blood pressure response. Clinical Symptoms:  No significant symptoms noted. ECG Impression:  No significant ST segment change suggestive of ischemia. Comparison with Prior Nuclear Study: No significant change from previous study  Overall Impression:  Low risk stress nuclear study Diaphragmatic attenuation. No ischemia.  LV Wall Motion:  NL LV Function; NL Wall Motion   Dennis Harp, MD  05/29/2014 9:20 AM

## 2014-05-28 NOTE — Progress Notes (Signed)
2D Echocardiogram Complete.  05/28/2014   Laiden Milles Sultan, RDCS

## 2014-05-28 NOTE — Telephone Encounter (Signed)
Await nuclear results Kirk Ruths

## 2014-05-28 NOTE — Telephone Encounter (Signed)
Pt's wife called in stating 2 things: #1- Pt will need a new prescription for the BP medication that is 100mg  #2- Dr. Sherley Bounds' office will need to the pt to be cleared for a back surgery he will be having on 12/18.  Please call Thanks

## 2014-05-28 NOTE — Telephone Encounter (Signed)
Left message for patient dtr, new script has been sent to the pharmacy. Will forward for dr Stanford Breed review for clearance.

## 2014-05-29 ENCOUNTER — Encounter (HOSPITAL_COMMUNITY): Payer: Medicare Other

## 2014-05-29 ENCOUNTER — Ambulatory Visit (HOSPITAL_COMMUNITY)
Admission: RE | Admit: 2014-05-29 | Discharge: 2014-05-29 | Disposition: A | Discharge status: Home / Self Care | Payer: 59 | Source: Ambulatory Visit | Attending: Cardiovascular Disease | Admitting: Cardiovascular Disease

## 2014-05-29 ENCOUNTER — Encounter: Payer: Self-pay | Admitting: *Deleted

## 2014-05-29 DIAGNOSIS — I25810 Atherosclerosis of coronary artery bypass graft(s) without angina pectoris: Secondary | ICD-10-CM | POA: Insufficient documentation

## 2014-05-29 DIAGNOSIS — I10 Essential (primary) hypertension: Secondary | ICD-10-CM | POA: Insufficient documentation

## 2014-05-29 DIAGNOSIS — Z87891 Personal history of nicotine dependence: Secondary | ICD-10-CM | POA: Insufficient documentation

## 2014-05-29 DIAGNOSIS — E785 Hyperlipidemia, unspecified: Secondary | ICD-10-CM | POA: Insufficient documentation

## 2014-05-29 DIAGNOSIS — G4733 Obstructive sleep apnea (adult) (pediatric): Secondary | ICD-10-CM | POA: Insufficient documentation

## 2014-05-29 DIAGNOSIS — E119 Type 2 diabetes mellitus without complications: Secondary | ICD-10-CM | POA: Insufficient documentation

## 2014-05-29 DIAGNOSIS — I2510 Atherosclerotic heart disease of native coronary artery without angina pectoris: Secondary | ICD-10-CM | POA: Diagnosis not present

## 2014-05-29 DIAGNOSIS — R0600 Dyspnea, unspecified: Secondary | ICD-10-CM | POA: Diagnosis present

## 2014-05-29 DIAGNOSIS — J449 Chronic obstructive pulmonary disease, unspecified: Secondary | ICD-10-CM | POA: Insufficient documentation

## 2014-05-29 MED ORDER — TECHNETIUM TC 99M SESTAMIBI GENERIC - CARDIOLITE
31.0000 | Freq: Once | INTRAVENOUS | Status: AC | PRN
  Administered 2014-05-29: 31 via INTRAVENOUS

## 2014-05-29 NOTE — Telephone Encounter (Signed)
Ok for surgery Dennis Vasquez  

## 2014-06-19 ENCOUNTER — Encounter (HOSPITAL_COMMUNITY): Payer: Self-pay

## 2014-06-19 ENCOUNTER — Encounter (HOSPITAL_COMMUNITY)
Admission: RE | Admit: 2014-06-19 | Discharge: 2014-06-19 | Disposition: A | Discharge status: Home / Self Care | Payer: 59 | Source: Ambulatory Visit | Attending: Neurological Surgery | Admitting: Neurological Surgery

## 2014-06-19 HISTORY — DX: Sleep apnea, unspecified: G47.30

## 2014-06-19 HISTORY — DX: Reserved for inherently not codable concepts without codable children: IMO0001

## 2014-06-19 HISTORY — DX: Acute myocardial infarction, unspecified: I21.9

## 2014-06-19 HISTORY — DX: Cardiac murmur, unspecified: R01.1

## 2014-06-19 HISTORY — DX: Personal history of urinary calculi: Z87.442

## 2014-06-19 HISTORY — DX: Gastro-esophageal reflux disease without esophagitis: K21.9

## 2014-06-19 LAB — PROTIME-INR
INR: 0.98 (ref 0.00–1.49)
Prothrombin Time: 13.1 seconds (ref 11.6–15.2)

## 2014-06-19 LAB — CBC WITH DIFFERENTIAL/PLATELET
Basophils Absolute: 0 10*3/uL (ref 0.0–0.1)
Basophils Relative: 0 % (ref 0–1)
Eosinophils Absolute: 0.2 10*3/uL (ref 0.0–0.7)
Eosinophils Relative: 2 % (ref 0–5)
HEMATOCRIT: 43.9 % (ref 39.0–52.0)
HEMOGLOBIN: 14.7 g/dL (ref 13.0–17.0)
Lymphocytes Relative: 14 % (ref 12–46)
Lymphs Abs: 1.3 10*3/uL (ref 0.7–4.0)
MCH: 29.8 pg (ref 26.0–34.0)
MCHC: 33.5 g/dL (ref 30.0–36.0)
MCV: 88.9 fL (ref 78.0–100.0)
MONO ABS: 0.9 10*3/uL (ref 0.1–1.0)
MONOS PCT: 10 % (ref 3–12)
NEUTROS ABS: 6.5 10*3/uL (ref 1.7–7.7)
Neutrophils Relative %: 74 % (ref 43–77)
Platelets: 278 10*3/uL (ref 150–400)
RBC: 4.94 MIL/uL (ref 4.22–5.81)
RDW: 13.4 % (ref 11.5–15.5)
WBC: 8.8 10*3/uL (ref 4.0–10.5)

## 2014-06-19 LAB — BASIC METABOLIC PANEL
ANION GAP: 14 (ref 5–15)
BUN: 17 mg/dL (ref 6–23)
CHLORIDE: 99 meq/L (ref 96–112)
CO2: 24 meq/L (ref 19–32)
Calcium: 9.2 mg/dL (ref 8.4–10.5)
Creatinine, Ser: 1.07 mg/dL (ref 0.50–1.35)
GFR calc Af Amer: 82 mL/min — ABNORMAL LOW (ref >90)
GFR calc non Af Amer: 70 mL/min — ABNORMAL LOW (ref >90)
GLUCOSE: 190 mg/dL — AB (ref 70–99)
Potassium: 3.8 mEq/L (ref 3.7–5.3)
Sodium: 137 mEq/L (ref 137–147)

## 2014-06-19 LAB — SURGICAL PCR SCREEN
MRSA, PCR: NEGATIVE
Staphylococcus aureus: POSITIVE — AB

## 2014-06-19 LAB — TYPE AND SCREEN
ABO/RH(D): A POS
ANTIBODY SCREEN: NEGATIVE

## 2014-06-19 LAB — ABO/RH: ABO/RH(D): A POS

## 2014-06-19 NOTE — Pre-Procedure Instructions (Addendum)
Dennis Vasquez  06/19/2014   Your procedure is scheduled on:  06/21/14  Report to [facility] short stay admitting at 900 AM.  Call this number if you have problems the morning of surgery: [phone]   Remember:   Do not eat food or drink liquids after midnight.   Take these medicines the morning of surgery with A SIP OF WATER: CLONIDINE,HYDRALAZINE,BYSTOLIC, PRILOSEC, PAIN MED IF NEEDED  STOP all herbel meds, nsaids (aleve,naproxen,advil,ibuprofen) now including vitamins, aspirin, fish oil  No insulin am of surgery   Do not wear jewelry, make-up or nail polish.  Do not wear lotions, powders, or perfumes. You may wear deodorant.  Do not shave 48 hours prior to surgery. Men may shave face and neck.  Do not bring valuables to the hospital.  [facility] is not responsible                  for any belongings or valuables.               Contacts, dentures or bridgework may not be worn into surgery.  Leave suitcase in the car. After surgery it may be brought to your room.  For patients admitted to the hospital, discharge time is determined by your                treatment team.               Patients discharged the day of surgery will not be allowed to drive  home.  Name and phone number of your driver:   Special Instructions:  Special Instructions: [place] - Preparing for Surgery  Before surgery, you can play an important role.  Because skin is not sterile, your skin needs to be as free of germs as possible.  You can reduce the number of germs on you skin by washing with CHG (chlorahexidine gluconate) soap before surgery.  CHG is an antiseptic cleaner which kills germs and bonds with the skin to continue killing germs even after washing.  Please DO NOT use if you have an allergy to CHG or antibacterial soaps.  If your skin becomes reddened/irritated stop using the CHG and inform your nurse when you arrive at Short Stay.  Do not shave (including legs and underarms) for at least 48  hours prior to the first CHG shower.  You may shave your face.  Please follow these instructions carefully:   1.  Shower with CHG Soap the night before surgery and the morning of Surgery.  2.  If you choose to wash your hair, wash your hair first as usual with your normal shampoo.  3.  After you shampoo, rinse your hair and body thoroughly to remove the Shampoo.  4.  Use CHG as you would any other liquid soap.  You can apply chg directly  to the skin and wash gently with scrungie or a clean washcloth.  5.  Apply the CHG Soap to your body ONLY FROM THE NECK DOWN.  Do not use on open wounds or open sores.  Avoid contact with your eyes ears, mouth and genitals (private parts).  Wash genitals (private parts)       with your normal soap.  6.  Wash thoroughly, paying special attention to the area where your surgery will be performed.  7.  Thoroughly rinse your body with warm water from the neck down.  8.  DO NOT shower/wash with your normal soap after using and rinsing off the CHG  Soap.  9.  Pat yourself dry with a clean towel.            10.  Wear clean pajamas.            11.  Place clean sheets on your bed the night of your first shower and do not sleep with pets.  Day of Surgery  Do not apply any lotions/deodorants the morning of surgery.  Please wear clean clothes to the hospital/surgery center.   Please read over the following fact sheets that you were given: Pain Booklet, Coughing and Deep Breathing, Blood Transfusion Information, MRSA Information and Surgical Site Infection Prevention

## 2014-06-20 MED ORDER — DEXAMETHASONE SODIUM PHOSPHATE 10 MG/ML IJ SOLN
10.0000 mg | INTRAMUSCULAR | Status: AC
  Administered 2014-06-21: 10 mg via INTRAVENOUS
  Filled 2014-06-20: qty 1

## 2014-06-20 MED ORDER — CEFAZOLIN SODIUM 10 G IJ SOLR
3.0000 g | INTRAMUSCULAR | Status: AC
  Administered 2014-06-21: 3 g via INTRAVENOUS
  Filled 2014-06-20: qty 3000

## 2014-06-20 NOTE — Progress Notes (Signed)
Anesthesia Chart Review:  Patient is a 68 year old male scheduled for MAS, PLIF L3-4 on 06/21/14 by Dr. Ronnald Ramp.  History includes former smoker, HTN, COPD with history of chlorine inhalation lung injury, CAD with question of prior MI '06 (diffuse non-obstructive CAD by 2013 cath), murmur (trivial TR 05/2014 echo), HLD, DM2, BPH, GERD, pulmonary nodules (stable; no further w/u 2009), "mild" OSA (no CPAP), cervical disc surgery '97, L3-4 microdiscectomy '13. BMI is consistent with morbid obesity.  PCP is Dr. Jenna Luo. Cardiologist is Dr. Stanford Breed who felt patient was "[facility] for surgery" following a low risk stress test.  05/29/14 Nuclear stress test:  Overall Impression: Low risk stress nuclear study Diaphragmatic attenuation. No ischemia. LV Wall Motion: NL LV Function, EF 68%; NL Wall Motion.  05/28/14 Echo: - Left ventricle: The cavity size was normal. There was severe concentric hypertrophy. Systolic function was normal. The estimated ejection fraction was in the range of 60% to 65%. Wallmotion was normal; there were no regional wall motionabnormalities. Doppler parameters are consistent with abnormalleft ventricular relaxation (grade 1 diastolic dysfunction). TheE/e&' ratio is between 8-15, suggesting indeterminate LV fillingpressure. - Left atrium: LA Volume/BSA= 26.2 ml/m2. The atrium was normal insize. - Right ventricle: The cavity size was normal. Wall thickness was normal. Systolic function was normal. - Right atrium: The atrium was normal in size. - Tricuspid valve: There was trivial regurgitation. - Pulmonary arteries: PA peak pressure: 21 mm Hg (S). - Inferior vena cava: The vessel was normal in size. The respirophasic diameter changes were in the normal range (= 50%), consistent with normal central venous pressure. Impressions: Compared to the prior echo in 06/2012, there is now moderate tosevere LV wall thickness.  08/13/11 Cardiac cath (done following an abnormal nuclear  study): Pulmonary capillary wedge pressure 17, pa 32/13, proximal 40-50% LAD, 50% mid right coronary artery with 50% distal and 55-65% ejection fraction.   EKG on 05/09/14: NSR, incomplete right BBB.  04/23/14 CXR: No acute abnormality seen.  Preoperative labs noted.    If no acute changes then plan to proceed.  George Hugh [facility] Short Stay Center/Anesthesiology Phone [phone] 06/20/2014 10:16 AM

## 2014-06-21 ENCOUNTER — Inpatient Hospital Stay (HOSPITAL_COMMUNITY): Payer: 59

## 2014-06-21 ENCOUNTER — Inpatient Hospital Stay (HOSPITAL_COMMUNITY): Payer: 59 | Admitting: Emergency Medicine

## 2014-06-21 ENCOUNTER — Inpatient Hospital Stay (HOSPITAL_COMMUNITY): Payer: 59 | Admitting: Anesthesiology

## 2014-06-21 ENCOUNTER — Encounter (HOSPITAL_COMMUNITY)
Admission: RE | Disposition: A | Discharge status: Home / Self Care | Payer: Self-pay | Source: Ambulatory Visit | Attending: Neurological Surgery

## 2014-06-21 ENCOUNTER — Inpatient Hospital Stay (HOSPITAL_COMMUNITY)
Admission: RE | Admit: 2014-06-21 | Discharge: 2014-06-23 | DRG: 460 | Disposition: A | Discharge status: Home / Self Care | Payer: 59 | Source: Ambulatory Visit | Attending: Neurological Surgery | Admitting: Neurological Surgery

## 2014-06-21 ENCOUNTER — Encounter (HOSPITAL_COMMUNITY): Payer: Self-pay | Admitting: *Deleted

## 2014-06-21 DIAGNOSIS — M4806 Spinal stenosis, lumbar region: Secondary | ICD-10-CM | POA: Diagnosis present

## 2014-06-21 DIAGNOSIS — M79661 Pain in right lower leg: Secondary | ICD-10-CM | POA: Diagnosis present

## 2014-06-21 DIAGNOSIS — K219 Gastro-esophageal reflux disease without esophagitis: Secondary | ICD-10-CM | POA: Diagnosis present

## 2014-06-21 DIAGNOSIS — Z794 Long term (current) use of insulin: Secondary | ICD-10-CM

## 2014-06-21 DIAGNOSIS — M179 Osteoarthritis of knee, unspecified: Secondary | ICD-10-CM | POA: Diagnosis present

## 2014-06-21 DIAGNOSIS — Z86010 Personal history of colonic polyps: Secondary | ICD-10-CM | POA: Diagnosis not present

## 2014-06-21 DIAGNOSIS — I2510 Atherosclerotic heart disease of native coronary artery without angina pectoris: Secondary | ICD-10-CM | POA: Diagnosis present

## 2014-06-21 DIAGNOSIS — G4733 Obstructive sleep apnea (adult) (pediatric): Secondary | ICD-10-CM | POA: Diagnosis present

## 2014-06-21 DIAGNOSIS — I252 Old myocardial infarction: Secondary | ICD-10-CM

## 2014-06-21 DIAGNOSIS — Z87891 Personal history of nicotine dependence: Secondary | ICD-10-CM | POA: Diagnosis not present

## 2014-06-21 DIAGNOSIS — M5126 Other intervertebral disc displacement, lumbar region: Principal | ICD-10-CM | POA: Diagnosis present

## 2014-06-21 DIAGNOSIS — Z981 Arthrodesis status: Secondary | ICD-10-CM

## 2014-06-21 DIAGNOSIS — Z87442 Personal history of urinary calculi: Secondary | ICD-10-CM

## 2014-06-21 DIAGNOSIS — J449 Chronic obstructive pulmonary disease, unspecified: Secondary | ICD-10-CM | POA: Diagnosis present

## 2014-06-21 DIAGNOSIS — N400 Enlarged prostate without lower urinary tract symptoms: Secondary | ICD-10-CM | POA: Diagnosis present

## 2014-06-21 DIAGNOSIS — E119 Type 2 diabetes mellitus without complications: Secondary | ICD-10-CM | POA: Diagnosis present

## 2014-06-21 DIAGNOSIS — I10 Essential (primary) hypertension: Secondary | ICD-10-CM | POA: Diagnosis present

## 2014-06-21 DIAGNOSIS — M419 Scoliosis, unspecified: Secondary | ICD-10-CM | POA: Diagnosis present

## 2014-06-21 DIAGNOSIS — E782 Mixed hyperlipidemia: Secondary | ICD-10-CM | POA: Diagnosis present

## 2014-06-21 DIAGNOSIS — IMO0002 Reserved for concepts with insufficient information to code with codable children: Secondary | ICD-10-CM

## 2014-06-21 HISTORY — PX: MAXIMUM ACCESS (MAS)POSTERIOR LUMBAR INTERBODY FUSION (PLIF) 1 LEVEL: SHX6368

## 2014-06-21 LAB — GLUCOSE, CAPILLARY
GLUCOSE-CAPILLARY: 166 mg/dL — AB (ref 70–99)
GLUCOSE-CAPILLARY: 186 mg/dL — AB (ref 70–99)
GLUCOSE-CAPILLARY: 259 mg/dL — AB (ref 70–99)
Glucose-Capillary: 263 mg/dL — ABNORMAL HIGH (ref 70–99)

## 2014-06-21 SURGERY — FOR MAXIMUM ACCESS (MAS) POSTERIOR LUMBAR INTERBODY FUSION (PLIF) 1 LEVEL
Anesthesia: General | Site: Back

## 2014-06-21 MED ORDER — BENAZEPRIL HCL 20 MG PO TABS
40.0000 mg | ORAL_TABLET | Freq: Every day | ORAL | Status: DC
  Administered 2014-06-22 – 2014-06-23 (×2): 40 mg via ORAL
  Filled 2014-06-21 (×2): qty 2

## 2014-06-21 MED ORDER — SODIUM CHLORIDE 0.9 % IJ SOLN
3.0000 mL | INTRAMUSCULAR | Status: DC | PRN
Start: 2014-06-21 — End: 2014-06-23

## 2014-06-21 MED ORDER — SODIUM CHLORIDE 0.9 % IJ SOLN
3.0000 mL | Freq: Two times a day (BID) | INTRAMUSCULAR | Status: DC
  Administered 2014-06-21 – 2014-06-22 (×3): 3 mL via INTRAVENOUS

## 2014-06-21 MED ORDER — DEXAMETHASONE SODIUM PHOSPHATE 10 MG/ML IJ SOLN
INTRAMUSCULAR | Status: AC
  Filled 2014-06-21: qty 1

## 2014-06-21 MED ORDER — SODIUM CHLORIDE 0.9 % IR SOLN
Status: DC | PRN
  Administered 2014-06-21: 14:00:00

## 2014-06-21 MED ORDER — MIDAZOLAM HCL 5 MG/5ML IJ SOLN
INTRAMUSCULAR | Status: DC | PRN
  Administered 2014-06-21: 2 mg via INTRAVENOUS

## 2014-06-21 MED ORDER — AMLODIPINE BESYLATE 10 MG PO TABS
10.0000 mg | ORAL_TABLET | Freq: Every day | ORAL | Status: DC
  Administered 2014-06-22 – 2014-06-23 (×2): 10 mg via ORAL
  Filled 2014-06-21 (×2): qty 1

## 2014-06-21 MED ORDER — FENTANYL CITRATE 0.05 MG/ML IJ SOLN
INTRAMUSCULAR | Status: DC | PRN
  Administered 2014-06-21: 50 ug via INTRAVENOUS
  Administered 2014-06-21 (×2): 100 ug via INTRAVENOUS
  Administered 2014-06-21: 50 ug via INTRAVENOUS

## 2014-06-21 MED ORDER — FENTANYL CITRATE 0.05 MG/ML IJ SOLN
INTRAMUSCULAR | Status: AC
  Filled 2014-06-21: qty 5

## 2014-06-21 MED ORDER — ZOLPIDEM TARTRATE 5 MG PO TABS: 10.0000 mg | ORAL_TABLET | Freq: Every evening | ORAL | Status: DC | PRN

## 2014-06-21 MED ORDER — TIZANIDINE HCL 4 MG PO TABS
4.0000 mg | ORAL_TABLET | Freq: Four times a day (QID) | ORAL | Status: DC | PRN
  Filled 2014-06-21: qty 1

## 2014-06-21 MED ORDER — FENTANYL CITRATE 0.05 MG/ML IJ SOLN
25.0000 ug | INTRAMUSCULAR | Status: DC | PRN
  Administered 2014-06-21 (×3): 50 ug via INTRAVENOUS

## 2014-06-21 MED ORDER — PROMETHAZINE HCL 25 MG/ML IJ SOLN: 6.2500 mg | INTRAMUSCULAR | Status: DC | PRN

## 2014-06-21 MED ORDER — CELECOXIB 200 MG PO CAPS
200.0000 mg | ORAL_CAPSULE | Freq: Two times a day (BID) | ORAL | Status: DC
  Administered 2014-06-21 – 2014-06-23 (×4): 200 mg via ORAL
  Filled 2014-06-21 (×4): qty 1

## 2014-06-21 MED ORDER — FENTANYL CITRATE 0.05 MG/ML IJ SOLN
INTRAMUSCULAR | Status: AC
  Filled 2014-06-21: qty 2

## 2014-06-21 MED ORDER — PROPOFOL 10 MG/ML IV BOLUS
INTRAVENOUS | Status: AC
  Filled 2014-06-21: qty 20

## 2014-06-21 MED ORDER — POTASSIUM CHLORIDE IN NACL 20-0.9 MEQ/L-% IV SOLN
INTRAVENOUS | Status: DC
  Administered 2014-06-22: 04:00:00 via INTRAVENOUS
  Filled 2014-06-21: qty 1000

## 2014-06-21 MED ORDER — INSULIN GLARGINE 100 UNIT/ML ~~LOC~~ SOLN
60.0000 [IU] | Freq: Every morning | SUBCUTANEOUS | Status: DC
  Administered 2014-06-22 – 2014-06-23 (×2): 60 [IU] via SUBCUTANEOUS
  Filled 2014-06-21 (×2): qty 0.6

## 2014-06-21 MED ORDER — THROMBIN 20000 UNITS EX SOLR
CUTANEOUS | Status: DC | PRN
  Administered 2014-06-21: 14:00:00 via TOPICAL

## 2014-06-21 MED ORDER — PROPOFOL INFUSION 10 MG/ML OPTIME
INTRAVENOUS | Status: DC | PRN
  Administered 2014-06-21: 120 ug/kg/min via INTRAVENOUS

## 2014-06-21 MED ORDER — NEBIVOLOL HCL 5 MG PO TABS
5.0000 mg | ORAL_TABLET | Freq: Every day | ORAL | Status: DC
  Administered 2014-06-22 – 2014-06-23 (×2): 5 mg via ORAL
  Filled 2014-06-21 (×2): qty 1

## 2014-06-21 MED ORDER — MEPERIDINE HCL 25 MG/ML IJ SOLN: 6.2500 mg | INTRAMUSCULAR | Status: DC | PRN

## 2014-06-21 MED ORDER — PHENYLEPHRINE HCL 10 MG/ML IJ SOLN
INTRAMUSCULAR | Status: DC | PRN
  Administered 2014-06-21: 80 ug via INTRAVENOUS

## 2014-06-21 MED ORDER — ONDANSETRON HCL 4 MG/2ML IJ SOLN
INTRAMUSCULAR | Status: AC
  Filled 2014-06-21: qty 2

## 2014-06-21 MED ORDER — SODIUM CHLORIDE 0.9 % IV SOLN
10.0000 mg | INTRAVENOUS | Status: DC | PRN
  Administered 2014-06-21: 20 ug/min via INTRAVENOUS

## 2014-06-21 MED ORDER — HYDRALAZINE HCL 50 MG PO TABS
100.0000 mg | ORAL_TABLET | Freq: Three times a day (TID) | ORAL | Status: DC
  Administered 2014-06-21 – 2014-06-23 (×5): 100 mg via ORAL
  Filled 2014-06-21 (×5): qty 2

## 2014-06-21 MED ORDER — PROPOFOL 10 MG/ML IV BOLUS
INTRAVENOUS | Status: DC | PRN
  Administered 2014-06-21: 200 mg via INTRAVENOUS

## 2014-06-21 MED ORDER — ALBUMIN HUMAN 5 % IV SOLN
INTRAVENOUS | Status: DC | PRN
  Administered 2014-06-21: 15:00:00 via INTRAVENOUS

## 2014-06-21 MED ORDER — ZOLPIDEM TARTRATE 5 MG PO TABS: 5.0000 mg | ORAL_TABLET | Freq: Every evening | ORAL | Status: DC | PRN

## 2014-06-21 MED ORDER — FUROSEMIDE 40 MG PO TABS
40.0000 mg | ORAL_TABLET | Freq: Two times a day (BID) | ORAL | Status: DC
  Administered 2014-06-21 – 2014-06-23 (×4): 40 mg via ORAL
  Filled 2014-06-21 (×4): qty 1

## 2014-06-21 MED ORDER — ARTIFICIAL TEARS OP OINT
TOPICAL_OINTMENT | OPHTHALMIC | Status: DC | PRN
  Administered 2014-06-21: 1 via OPHTHALMIC

## 2014-06-21 MED ORDER — LIDOCAINE HCL (CARDIAC) 20 MG/ML IV SOLN
INTRAVENOUS | Status: AC
  Filled 2014-06-21: qty 5

## 2014-06-21 MED ORDER — INSULIN ASPART 100 UNIT/ML ~~LOC~~ SOLN
11.0000 [IU] | Freq: Once | SUBCUTANEOUS | Status: AC
  Administered 2014-06-21: 11 [IU] via SUBCUTANEOUS

## 2014-06-21 MED ORDER — ASPIRIN EC 81 MG PO TBEC
81.0000 mg | DELAYED_RELEASE_TABLET | Freq: Every day | ORAL | Status: DC
  Administered 2014-06-22: 81 mg via ORAL
  Filled 2014-06-21 (×3): qty 1

## 2014-06-21 MED ORDER — LIDOCAINE HCL (CARDIAC) 20 MG/ML IV SOLN
INTRAVENOUS | Status: DC | PRN
  Administered 2014-06-21: 100 mg via INTRAVENOUS

## 2014-06-21 MED ORDER — BUPIVACAINE HCL (PF) 0.25 % IJ SOLN
INTRAMUSCULAR | Status: DC | PRN
  Administered 2014-06-21: 5 mL

## 2014-06-21 MED ORDER — THROMBIN 5000 UNITS EX SOLR
OROMUCOSAL | Status: DC | PRN
  Administered 2014-06-21: 14:00:00 via TOPICAL

## 2014-06-21 MED ORDER — MORPHINE SULFATE 2 MG/ML IJ SOLN: 1.0000 mg | INTRAMUSCULAR | Status: DC | PRN

## 2014-06-21 MED ORDER — EPHEDRINE SULFATE 50 MG/ML IJ SOLN
INTRAMUSCULAR | Status: DC | PRN
  Administered 2014-06-21 (×2): 10 mg via INTRAVENOUS

## 2014-06-21 MED ORDER — 0.9 % SODIUM CHLORIDE (POUR BTL) OPTIME
TOPICAL | Status: DC | PRN
  Administered 2014-06-21: 1000 mL

## 2014-06-21 MED ORDER — ACETAMINOPHEN 10 MG/ML IV SOLN
INTRAVENOUS | Status: AC
  Administered 2014-06-21: 1000 mg via INTRAVENOUS
  Filled 2014-06-21: qty 100

## 2014-06-21 MED ORDER — LACTATED RINGERS IV SOLN
INTRAVENOUS | Status: DC
  Administered 2014-06-21 (×2): via INTRAVENOUS

## 2014-06-21 MED ORDER — ONDANSETRON HCL 4 MG/2ML IJ SOLN
4.0000 mg | INTRAMUSCULAR | Status: DC | PRN
  Administered 2014-06-22: 4 mg via INTRAVENOUS
  Filled 2014-06-21: qty 2

## 2014-06-21 MED ORDER — ACETAMINOPHEN 325 MG PO TABS: 650.0000 mg | ORAL_TABLET | ORAL | Status: DC | PRN

## 2014-06-21 MED ORDER — ONDANSETRON HCL 4 MG/2ML IJ SOLN
INTRAMUSCULAR | Status: DC | PRN
  Administered 2014-06-21: 4 mg via INTRAVENOUS

## 2014-06-21 MED ORDER — ARTIFICIAL TEARS OP OINT
TOPICAL_OINTMENT | OPHTHALMIC | Status: AC
  Filled 2014-06-21: qty 3.5

## 2014-06-21 MED ORDER — OXYCODONE-ACETAMINOPHEN 5-325 MG PO TABS
1.0000 | ORAL_TABLET | ORAL | Status: DC | PRN
  Administered 2014-06-21: 1 via ORAL
  Administered 2014-06-21 – 2014-06-22 (×2): 2 via ORAL
  Filled 2014-06-21 (×3): qty 2

## 2014-06-21 MED ORDER — SODIUM CHLORIDE 0.9 % IV SOLN: 250.0000 mL | INTRAVENOUS | Status: DC

## 2014-06-21 MED ORDER — ACETAMINOPHEN 650 MG RE SUPP: 650.0000 mg | RECTAL | Status: DC | PRN

## 2014-06-21 MED ORDER — CLONIDINE HCL 0.1 MG PO TABS
0.3000 mg | ORAL_TABLET | Freq: Three times a day (TID) | ORAL | Status: DC
  Administered 2014-06-21 – 2014-06-23 (×5): 0.3 mg via ORAL
  Filled 2014-06-21 (×5): qty 3

## 2014-06-21 MED ORDER — CEFAZOLIN SODIUM 1-5 GM-% IV SOLN
1.0000 g | Freq: Three times a day (TID) | INTRAVENOUS | Status: AC
Start: 2014-06-21 — End: 2014-06-22
  Administered 2014-06-21 – 2014-06-22 (×2): 1 g via INTRAVENOUS
  Filled 2014-06-21 (×2): qty 50

## 2014-06-21 MED ORDER — MUPIROCIN CALCIUM 2 % EX CREA
TOPICAL_CREAM | Freq: Two times a day (BID) | CUTANEOUS | Status: DC
  Administered 2014-06-21 – 2014-06-23 (×4): via TOPICAL
  Filled 2014-06-21: qty 15

## 2014-06-21 MED ORDER — PHENOL 1.4 % MT LIQD: 1.0000 | OROMUCOSAL | Status: DC | PRN

## 2014-06-21 MED ORDER — AMLODIPINE BESY-BENAZEPRIL HCL 10-40 MG PO CAPS
1.0000 | ORAL_CAPSULE | Freq: Every day | ORAL | Status: DC
Start: 2014-06-21 — End: 2014-06-21

## 2014-06-21 MED ORDER — MENTHOL 3 MG MT LOZG: 1.0000 | LOZENGE | OROMUCOSAL | Status: DC | PRN

## 2014-06-21 MED ORDER — MIDAZOLAM HCL 2 MG/2ML IJ SOLN
INTRAMUSCULAR | Status: AC
  Filled 2014-06-21: qty 2

## 2014-06-21 MED ORDER — INSULIN ASPART 100 UNIT/ML ~~LOC~~ SOLN
0.0000 [IU] | Freq: Three times a day (TID) | SUBCUTANEOUS | Status: DC
  Administered 2014-06-22: 4 [IU] via SUBCUTANEOUS
  Administered 2014-06-22: 7 [IU] via SUBCUTANEOUS
  Administered 2014-06-22: 4 [IU] via SUBCUTANEOUS
  Administered 2014-06-23: 3 [IU] via SUBCUTANEOUS
  Administered 2014-06-23: 4 [IU] via SUBCUTANEOUS

## 2014-06-21 SURGICAL SUPPLY — 71 items
APL SKNCLS STERI-STRIP NONHPOA (GAUZE/BANDAGES/DRESSINGS) ×1
BAG DECANTER FOR FLEXI CONT (MISCELLANEOUS) ×3 IMPLANT
BENZOIN TINCTURE PRP APPL 2/3 (GAUZE/BANDAGES/DRESSINGS) ×3 IMPLANT
BLADE CLIPPER SURG (BLADE) IMPLANT
BONE MATRIX OSTEOCEL PRO SM (Bone Implant) ×4 IMPLANT
BUR MATCHSTICK NEURO 3.0 LAGG (BURR) ×3 IMPLANT
CANISTER SUCT 3000ML (MISCELLANEOUS) ×3 IMPLANT
CLIP NEUROVISION LG (CLIP) ×2 IMPLANT
CLOSURE WOUND 1/2 X4 (GAUZE/BANDAGES/DRESSINGS) ×1
CONT SPEC 4OZ CLIKSEAL STRL BL (MISCELLANEOUS) ×6 IMPLANT
COROENT 10X9X28-4 (Spacer) ×4 IMPLANT
COVER BACK TABLE 24X17X13 BIG (DRAPES) IMPLANT
COVER BACK TABLE 60X90IN (DRAPES) ×3 IMPLANT
DRAPE C-ARM 42X72 X-RAY (DRAPES) ×3 IMPLANT
DRAPE C-ARMOR (DRAPES) ×3 IMPLANT
DRAPE LAPAROTOMY 100X72X124 (DRAPES) ×3 IMPLANT
DRAPE POUCH INSTRU U-SHP 10X18 (DRAPES) ×3 IMPLANT
DRAPE SURG 17X23 STRL (DRAPES) ×3 IMPLANT
DRSG OPSITE 4X5.5 SM (GAUZE/BANDAGES/DRESSINGS) ×6 IMPLANT
DRSG OPSITE POSTOP 4X6 (GAUZE/BANDAGES/DRESSINGS) ×2 IMPLANT
DRSG TELFA 3X8 NADH (GAUZE/BANDAGES/DRESSINGS) ×3 IMPLANT
DURAPREP 26ML APPLICATOR (WOUND CARE) ×3 IMPLANT
ELECT REM PT RETURN 9FT ADLT (ELECTROSURGICAL) ×3
ELECTRODE REM PT RTRN 9FT ADLT (ELECTROSURGICAL) ×1 IMPLANT
EVACUATOR 1/8 PVC DRAIN (DRAIN) ×3 IMPLANT
GAUZE SPONGE 4X4 16PLY XRAY LF (GAUZE/BANDAGES/DRESSINGS) IMPLANT
GLOVE BIO SURGEON STRL SZ8 (GLOVE) ×6 IMPLANT
GLOVE BIOGEL M 8.0 STRL (GLOVE) ×2 IMPLANT
GLOVE BIOGEL PI IND STRL 7.5 (GLOVE) IMPLANT
GLOVE BIOGEL PI INDICATOR 7.5 (GLOVE) ×2
GLOVE SURG SS PI 7.0 STRL IVOR (GLOVE) ×8 IMPLANT
GOWN STRL REUS W/ TWL LRG LVL3 (GOWN DISPOSABLE) IMPLANT
GOWN STRL REUS W/ TWL XL LVL3 (GOWN DISPOSABLE) ×2 IMPLANT
GOWN STRL REUS W/TWL 2XL LVL3 (GOWN DISPOSABLE) IMPLANT
GOWN STRL REUS W/TWL LRG LVL3 (GOWN DISPOSABLE) ×6
GOWN STRL REUS W/TWL XL LVL3 (GOWN DISPOSABLE) ×9
HEMOSTAT POWDER KIT SURGIFOAM (HEMOSTASIS) IMPLANT
HEMOSTAT POWDER SURGIFOAM 1G (HEMOSTASIS) ×2 IMPLANT
KIT BASIN OR (CUSTOM PROCEDURE TRAY) ×3 IMPLANT
KIT NDL NVM5 EMG ELECT (KITS) IMPLANT
KIT NEEDLE NVM5 EMG ELECT (KITS) ×1 IMPLANT
KIT NEEDLE NVM5 EMG ELECTRODE (KITS) ×2
KIT ROOM TURNOVER OR (KITS) ×3 IMPLANT
MILL MEDIUM DISP (BLADE) ×2 IMPLANT
NDL HYPO 25X1 1.5 SAFETY (NEEDLE) ×1 IMPLANT
NEEDLE HYPO 25X1 1.5 SAFETY (NEEDLE) ×3 IMPLANT
NS IRRIG 1000ML POUR BTL (IV SOLUTION) ×3 IMPLANT
PACK LAMINECTOMY NEURO (CUSTOM PROCEDURE TRAY) ×3 IMPLANT
PAD ARMBOARD 7.5X6 YLW CONV (MISCELLANEOUS) ×9 IMPLANT
PAD DRESSING TELFA 3X8 NADH (GAUZE/BANDAGES/DRESSINGS) ×1 IMPLANT
ROD PREBENT 45MM LUMBAR (Rod) ×4 IMPLANT
SCREW LOCK (Screw) ×12 IMPLANT
SCREW LOCK FXNS SPNE MAS PL (Screw) IMPLANT
SCREW MAS PLIF 5.5X30 (Screw) ×4 IMPLANT
SCREW MAS PLIF POLY 5.0X40 3Z (Screw) ×4 IMPLANT
SCREW PAS PLIF 5X30 (Screw) IMPLANT
SCREW SHANK 5.0X35 (Screw) ×4 IMPLANT
SCREW TULIP 5.5 (Screw) ×4 IMPLANT
SPONGE LAP 4X18 X RAY DECT (DISPOSABLE) IMPLANT
SPONGE SURGIFOAM ABS GEL 100 (HEMOSTASIS) ×3 IMPLANT
STRIP CLOSURE SKIN 1/2X4 (GAUZE/BANDAGES/DRESSINGS) ×3 IMPLANT
SUT VIC AB 0 CT1 18XCR BRD8 (SUTURE) ×1 IMPLANT
SUT VIC AB 0 CT1 8-18 (SUTURE) ×3
SUT VIC AB 2-0 CP2 18 (SUTURE) ×3 IMPLANT
SUT VIC AB 3-0 SH 8-18 (SUTURE) ×6 IMPLANT
SYR 20ML ECCENTRIC (SYRINGE) ×3 IMPLANT
SYR 3ML LL SCALE MARK (SYRINGE) IMPLANT
TOWEL OR 17X24 6PK STRL BLUE (TOWEL DISPOSABLE) ×3 IMPLANT
TOWEL OR 17X26 10 PK STRL BLUE (TOWEL DISPOSABLE) ×3 IMPLANT
TRAY FOLEY CATH 16FRSI W/METER (SET/KITS/TRAYS/PACK) ×2 IMPLANT
WATER STERILE IRR 1000ML POUR (IV SOLUTION) ×3 IMPLANT
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
## 2014-06-21 NOTE — Anesthesia Postprocedure Evaluation (Signed)
  Anesthesia Post-op Note  Patient: Dennis Vasquez  Procedure(s) Performed: Procedure(s): FOR MAXIMUM ACCESS (MAS) POSTERIOR LUMBAR INTERBODY FUSION LUMBAR THREE TO FOUR (PLIF) 1 LEVEL (N/A)  Patient Location: PACU  Anesthesia Type:General  Level of Consciousness: awake and alert   Airway and Oxygen Therapy: Patient Spontanous Breathing and Patient connected to nasal cannula oxygen  Post-op Pain: mild  Post-op Assessment: Post-op Vital signs reviewed, Patient's Cardiovascular Status Stable, Respiratory Function Stable, Patent Airway and No signs of Nausea or vomiting  Post-op Vital Signs: Reviewed and stable  Last Vitals:  Filed Vitals:   06/21/14 1635  BP:   Pulse: 78  Temp:   Resp: 12    Complications: No apparent anesthesia complications

## 2014-06-21 NOTE — Transfer of Care (Signed)
Immediate Anesthesia Transfer of Care Note  Patient: Dennis Vasquez  Procedure(s) Performed: Procedure(s): FOR MAXIMUM ACCESS (MAS) POSTERIOR LUMBAR INTERBODY FUSION LUMBAR THREE TO FOUR (PLIF) 1 LEVEL (N/A)  Patient Location: PACU  Anesthesia Type:General  Level of Consciousness: awake, alert , oriented and patient cooperative  Airway & Oxygen Therapy: Patient Spontanous Breathing and Patient connected to face mask oxygen  Post-op Assessment: Report given to PACU RN, Post -op Vital signs reviewed and stable and Patient moving all extremities  Post vital signs: Reviewed and stable  Complications: No apparent anesthesia complications

## 2014-06-21 NOTE — Anesthesia Preprocedure Evaluation (Addendum)
Anesthesia Evaluation  Patient identified by MRN, date of birth, ID band Patient awake    Reviewed: Allergy & Precautions, H&P , NPO status , Patient's Chart, lab work & pertinent test results, reviewed documented beta blocker date and time   Airway Mallampati: II   Neck ROM: Full    Dental  (+) Partial Upper, Teeth Intact, Dental Advisory Given   Pulmonary former smoker (quit 2005 80 pack year),  COPD from 40 pack year also chlorine gas exposure to lung breath sounds clear to auscultation        Cardiovascular hypertension, Pt. on medications Rhythm:Regular  Low risk stress 05/2014/ EF 68%, severe concentric vent hypertrophy, cleared by cardiology   Neuro/Psych    GI/Hepatic Neg liver ROS, GERD-  Medicated,  Endo/Other  diabetes, Poorly Controlled, Insulin Dependent  Renal/GU negative Renal ROS     Musculoskeletal   Abdominal (+) + obese,   Peds  Hematology   Anesthesia Other Findings   Reproductive/Obstetrics                            Anesthesia Physical Anesthesia Plan  ASA: III  Anesthesia Plan: General   Post-op Pain Management:    Induction: Intravenous  Airway Management Planned: Oral ETT  Additional Equipment:   Intra-op Plan:   Post-operative Plan: Extubation in OR  Informed Consent: I have reviewed the patients History and Physical, chart, labs and discussed the procedure including the risks, benefits and alternatives for the proposed anesthesia with the patient or authorized representative who has indicated his/her understanding and acceptance.     Plan Discussed with:   Anesthesia Plan Comments: (Consider 2nd IV after turning, multinodal pain RX to lessen narcotics 2nd to OSA and morbid obesity)        Anesthesia Quick Evaluation

## 2014-06-21 NOTE — Op Note (Signed)
06/21/2014  4:07 PM  PATIENT:  Dennis Vasquez  68 y.o. 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 Redo Decompressive lumbar laminectomy L3-4 requiring more work than would be required for a simple exposure of the disk for PLIF in order to adequately decompress the neural elements and address the spinal stenosis 2. Posterior lumbar interbody fusion L3-4 using PEEK interbody cages packed with morcellized allograft and autograft 3. Posterior fixation L3-4 using cortical pedicle screws.    SURGEON:  Sherley Bounds, MD  ASSISTANTS: Dr. Joya Salm  ANESTHESIA:  General  EBL: 200 ml  Total I/O In: 2350 [I.V.:2100; IV Piggyback:250] Out: 086 [Urine:450; Blood:200]  BLOOD ADMINISTERED:none  DRAINS: Hemovac   INDICATION FOR PROCEDURE: This patient underwent 2 previous decompressive laminectomies at L3-4. He presented with severe right leg pain and back pain. MRI showed retrolisthesis of L3 on L4 with recurrent disc herniation. He'll medical management including steroid injections. He got brief relief from 2 transforaminal epidural steroid injections at L3-4 on the right. I recommended decompressive laminectomy and instrumented fusion at L3-4. Patient understood the risks, benefits, and alternatives and potential outcomes and wished to proceed.  PROCEDURE DETAILS:  The patient was brought to the operating room. After induction of generalized endotracheal anesthesia the patient was rolled into the prone position on chest rolls and all pressure points were padded. The patient's lumbar region was cleaned and then prepped with DuraPrep and draped in the usual sterile fashion. Anesthesia was injected and then a dorsal midline incision was made and carried down to the lumbosacral fascia. The fascia was opened and the paraspinous musculature was taken down in a  subperiosteal fashion to expose L3-4. A self-retaining retractor was placed. Intraoperative fluoroscopy confirmed my level, and I started with placement of the L3 cortical pedicle screws. The pedicle screw entry zones were identified utilizing surface landmarks and  AP and lateral fluoroscopy. I scored the cortex with the high-speed drill and then used the hand drill and EMG monitoring to drill an upward and outward direction into the pedicle. I then tapped line to line, and the tap was also monitored. I then placed a 5-0 x 35 mm cortical pedicle screw into the pedicles of L3 bilaterally. I then turned my attention to the decompression and the spinous process was removed and complete lumbar laminectomies, hemi- facetectomies, and foraminotomies were performed at L3-4. The patient had significant spinal stenosis and this required more work than would be required for a simple exposure of the disc for posterior lumbar interbody fusion. Much more generous decompression was undertaken in order to adequately decompress the neural elements and address the patient's leg pain. The yellow ligament was removed to expose the underlying dura and nerve roots, and generous foraminotomies were performed to adequately decompress the neural elements. Both the exiting and traversing nerve roots were decompressed on both sides until a coronary dilator passed easily along the nerve roots. Once the decompression was complete, I turned my attention to the posterior lower lumbar interbody fusion. The epidural venous vasculature was coagulated and cut sharply. Disc space was incised and the initial discectomy was performed with pituitary rongeurs. The disc space was distracted with sequential distractors to a height of 10 mm. We then used a series of scrapers and shavers to prepare the endplates for fusion. The midline was prepared with Epstein curettes. Once the complete discectomy was finished,  we packed an appropriate sized peek  interbody cage with local autograft and morcellized allograft, gently retracted the nerve root, and tapped the cage into position at L3-4.  The midline between the cages was packed with morselized autograft and allograft. We then turned our attention to the placement of the lower pedicle screws. The pedicle screw entry zones were identified utilizing surface landmarks and fluoroscopy. I drilled into each pedicle utilizing the hand drill and EMG monitoring, and tapped each pedicle with the appropriate tap. We palpated with a ball probe to assure no break in the cortex. We then placed 5-0 x 40 mm cortical pedicle screws into the pedicles bilaterally at L4. . We then placed lordotic rods into the multiaxial screw heads of the pedicle screws and locked these in position with the locking caps and anti-torque device. We then checked our construct with AP and lateral fluoroscopy. Irrigated with copious amounts of bacitracin-containing saline solution. Placed a medium Hemovac drain through separate stab incision. Inspected the nerve roots once again to assure adequate decompression, lined to the dura with Gelfoam, and closed the muscle and the fascia with 0 Vicryl. Closed the subcutaneous tissues with 2-0 Vicryl and subcuticular tissues with 3-0 Vicryl. The skin was closed with benzoin and Steri-Strips. Dressing was then applied, the patient was awakened from general anesthesia and transported to the recovery room in stable condition. At the end of the procedure all sponge, needle and instrument counts were correct.   PLAN OF CARE: Admit to inpatient   PATIENT DISPOSITION:  PACU - hemodynamically stable.   Delay start of Pharmacological VTE agent (>24hrs) due to surgical blood loss or risk of bleeding:  yes

## 2014-06-21 NOTE — Progress Notes (Addendum)
Pt arrived to 4N28 at current time.  Pt A&O x 4, c/o 5/10 lower back surgical pain, site covered with CDI honeycomb dressing, Hemovac intact with 25 cc out.   Pt V/S taken, pt still on O2 from PACU, fluids running at 75 cc/hr.  Foley intact, unclamped. Pt without distress.  Family at the bedside. Diet ordered, will monitor.

## 2014-06-21 NOTE — Anesthesia Procedure Notes (Signed)
Procedure Name: Intubation Date/Time: 06/21/2014 12:32 PM Performed by: Julian Reil Pre-anesthesia Checklist: Patient identified, Emergency Drugs available, Suction available and Patient being monitored Patient Re-evaluated:Patient Re-evaluated prior to inductionOxygen Delivery Method: Circle system utilized Preoxygenation: Pre-oxygenation with 100% oxygen Intubation Type: IV induction Ventilation: Mask ventilation without difficulty and Oral airway inserted - appropriate to patient size Laryngoscope Size: 4 and Mac Grade View: Grade II Tube type: Oral Tube size: 7.5 mm Number of attempts: 1 Airway Equipment and Method: Stylet Placement Confirmation: ETT inserted through vocal cords under direct vision,  positive ETCO2 and breath sounds checked- equal and bilateral Secured at: 23 cm Tube secured with: Tape Dental Injury: Teeth and Oropharynx as per pre-operative assessment

## 2014-06-22 LAB — GLUCOSE, CAPILLARY
Glucose-Capillary: 216 mg/dL — ABNORMAL HIGH (ref 70–99)
Glucose-Capillary: 218 mg/dL — ABNORMAL HIGH (ref 70–99)
Glucose-Capillary: 178 mg/dL — ABNORMAL HIGH (ref 70–99)
Glucose-Capillary: 194 mg/dL — ABNORMAL HIGH (ref 70–99)
Glucose-Capillary: 160 mg/dL — ABNORMAL HIGH (ref 70–99)

## 2014-06-22 MED ORDER — ASPIRIN EC 81 MG PO TBEC
81.0000 mg | DELAYED_RELEASE_TABLET | Freq: Every day | ORAL | Status: DC
  Administered 2014-06-23: 81 mg via ORAL
  Filled 2014-06-22: qty 1

## 2014-06-22 MED ORDER — DOCUSATE SODIUM 100 MG PO CAPS
100.0000 mg | ORAL_CAPSULE | Freq: Every day | ORAL | Status: DC
  Administered 2014-06-23: 100 mg via ORAL
  Filled 2014-06-22: qty 1

## 2014-06-22 MED ORDER — SENNA 8.6 MG PO TABS
1.0000 | ORAL_TABLET | Freq: Two times a day (BID) | ORAL | Status: DC
  Administered 2014-06-23 (×2): 8.6 mg via ORAL
  Filled 2014-06-22 (×2): qty 1

## 2014-06-22 NOTE — Progress Notes (Signed)
Patient ID: Dennis Vasquez, male   DOB: 07-29-1945, 68 y.o.   MRN: [id number] Modest back soreness Overall feeling much improved from before surgery Hemovac drain with moderate output today Leave the drain in place today . Continue to encourage ambulation

## 2014-06-22 NOTE — Evaluation (Signed)
Occupational Therapy Evaluation Patient Details Name: Dennis Vasquez MRN: [id number] DOB: 10-16-1945 Today's Date: 06/22/2014    History of Present Illness Patient is a 68 y/o male s/p L3-4 PLIF. PMH- HTN, DM, PNA, COPD, CAD and MI.   Clinical Impression   Pt s/p above. Feel pt will benefit from acute OT to increase independence with BADLs and reinforce precautions prior to d/c. Education provided during session and pt moving well. Plan to review tomorrow and practice LB ADLs as well as shower transfer.     Follow Up Recommendations  No OT follow up;Supervision - Intermittent    Equipment Recommendations  Other (comment) (bariatric 3 in 1; AE)    Recommendations for Other Services       Precautions / Restrictions Precautions Precautions: Back Precaution Booklet Issued: No Precaution Comments: Reviewed precautions Required Braces or Orthoses: Spinal Brace Spinal Brace: Lumbar corset;Applied in sitting position Restrictions Weight Bearing Restrictions: No      Mobility Bed Mobility Overal bed mobility: Needs Assistance Bed Mobility: Rolling;Sidelying to Sit;Sit to Sidelying Rolling: Supervision Sidelying to sit: Supervision     Sit to sidelying: Modified independent (Device/Increase time) General bed mobility comments: cues for technique.   Transfers Overall transfer level: Needs assistance Equipment used: Rolling walker (2 wheeled) Transfers: Sit to/from Stand Sit to Stand: Min guard         General transfer comment: cues for hand placement.    Balance Overall balance assessment: No apparent balance deficits (not formally assessed)                          ADL Overall ADL's : Needs assistance/impaired                 Upper Body Dressing : Minimal assistance;Sitting   Lower Body Dressing: Minimal assistance;With adaptive equipment;Sit to/from stand   Toilet Transfer: Supervision/safety;Ambulation;RW (bed; Min guard for sit to stand  transfer)   Toileting- Clothing Manipulation and Hygiene: Moderate assistance;Sit to/from stand       Functional mobility during ADLs: Supervision/safety;Rolling walker General ADL Comments: Educated on AE/cost/where to purchase. Educated on toilet aide and what pt could use as toilet aide for hygiene. Educated on shower transfer technique and recommended spouse be with him for shower transfer. Educated on safety (rugs, safe shoewear, use of bag on walker). Recommended sitting for LB bathing. Pt practiced with reacher/sockaid. Educated on use of cup for oral care and placement of grooming items to avoid breaking precautions. Discussed incorporating precautions into functional activities. Educated on back brace. Discussed elastic shoe laces for shoes.     Vision   Pt wears glasses.                   Perception     Praxis      Pertinent Vitals/Pain Pain Assessment: No/denies pain     Hand Dominance     Extremity/Trunk Assessment Upper Extremity Assessment Upper Extremity Assessment: Overall WFL for tasks assessed   Lower Extremity Assessment Lower Extremity Assessment: Defer to PT evaluation       Communication Communication Communication: No difficulties   Cognition Arousal/Alertness: Awake/alert Behavior During Therapy: WFL for tasks assessed/performed Overall Cognitive Status: Within Functional Limits for tasks assessed                     General Comments       Exercises       Shoulder Instructions  Home Living Family/patient expects to be discharged to:: Private residence Living Arrangements: Spouse/significant other Available Help at Discharge: Family;Available 24 hours/day (wife works from home) Type of Home: House Home Access: Stairs to enter [employer] of Steps: 2 Entrance Stairs-Rails: None (has door to hold onto) Home Layout: Two level;Able to live on main level with bedroom/bathroom     Bathroom Shower/Tub: Emergency planning/management officer: Standard Bathroom Accessibility: Yes How Accessible: Accessible via walker Home Equipment: Robinson - single point;Hand held shower head          Prior Functioning/Environment Level of Independence: Needs assistance    ADL's / Homemaking Assistance Needed: assist with LB dressing few days prior to coming in for surgery        OT Diagnosis: Acute pain   OT Problem List: Decreased range of motion;Decreased knowledge of use of DME or AE;Decreased knowledge of precautions;Obesity   OT Treatment/Interventions: Self-care/ADL training;DME and/or AE instruction;Therapeutic activities;Patient/family education;Balance training    OT Goals(Current goals can be found in the care plan section) Acute Rehab OT Goals Patient Stated Goal: not stated OT Goal Formulation: With patient Time For Goal Achievement: 06/29/14 Potential to Achieve Goals: Good ADL Goals Pt Will Perform Grooming: with modified independence;standing Pt Will Perform Lower Body Dressing: with modified independence;sit to/from stand Pt Will Transfer to Toilet: with modified independence;ambulating Pt Will Perform Toileting - Clothing Manipulation and hygiene: with modified independence;sit to/from stand;with adaptive equipment Pt Will Perform Tub/Shower Transfer: Shower transfer;with supervision;ambulating;3 in 1;rolling walker  OT Frequency: Min 2X/week   Barriers to D/C:            Co-evaluation              End of Session Equipment Utilized During Treatment: Gait belt;Rolling walker;Back brace  Activity Tolerance: Patient tolerated treatment well Patient left: in bed;with call bell/phone within reach;with family/visitor present   Time: 6301-6010 OT Time Calculation (min): 30 min Charges:  OT General Charges $OT Visit: 1 Procedure OT Evaluation $Initial OT Evaluation Tier I: 1 Procedure OT Treatments $Self Care/Home Management : 8-22 mins G-CodesBenito Mccreedy  OTR/L [phone] 06/22/2014, 2:23 PM

## 2014-06-22 NOTE — Evaluation (Signed)
Physical Therapy Evaluation Patient Details Name: Dennis Vasquez MRN: [id number] DOB: 1945/07/14 Today's Date: 06/22/2014   History of Present Illness  Patient is a 68 y/o male s/p L3-4 PLIF. PMH- HTN, DM, PNA, COPD, CAD and MI.  Clinical Impression  Patient presents with pain and balance deficits s/p above surgery impacting safe mobility. Tolerated ambulation and stair negotiation with Min guard assist for safety. Education provided on back precautions - able to verbalize 3/3 at end of session. Anticipate mobility will improved with increased activity. Encourage sitting in chair for all meals. Pt would benefit from skilled PT to improve overall functional mobility prior to return home so pt can maximize independence.    Follow Up Recommendations No PT follow up;Supervision/Assistance - 24 hour    Equipment Recommendations  Rolling walker with 5" wheels    Recommendations for Other Services       Precautions / Restrictions Precautions Precautions: Back Precaution Booklet Issued: Yes (comment) Precaution Comments: Reviewed handout and back precautions. Required Braces or Orthoses: Spinal Brace Spinal Brace: Lumbar corset;Applied in sitting position Restrictions Weight Bearing Restrictions: No      Mobility  Bed Mobility Overal bed mobility: Needs Assistance Bed Mobility: Rolling;Sidelying to Sit;Sit to Sidelying Rolling: Min guard Sidelying to sit: Min guard     Sit to sidelying: Min guard General bed mobility comments: HOB flat, no use of rails to simulate home environment. Min guard for safety. Cues for log roll technique.  Transfers Overall transfer level: Needs assistance Equipment used: Rolling walker (2 wheeled) Transfers: Sit to/from Stand Sit to Stand: Min guard         General transfer comment: Min guard for safety. Cues for hand placement.  Ambulation/Gait Ambulation/Gait assistance: Min guard Ambulation Distance (Feet): 150 Feet Assistive device:  Rolling walker (2 wheeled) Gait Pattern/deviations: Step-through pattern;Decreased stride length   Gait velocity interpretation: Below normal speed for age/gender General Gait Details: Pt with slow, steady gait. Cues for RW proximity and for upright posture. 1 seated rest break due to fatigue and dyspnea. Vitals stable.  Stairs Stairs: Yes Stairs assistance: Min guard Stair Management: Step to pattern;Two rails Number of Stairs: 2 General stair comments: Min guard for safety. Cues for technique.  Wheelchair Mobility    Modified Rankin (Stroke Patients Only)       Balance Overall balance assessment: Needs assistance Sitting-balance support: Feet supported;No upper extremity supported Sitting balance-Leahy Scale: Good Sitting balance - Comments: Able to weightshift to adjust gown and donn LSO EOB independently.   Standing balance support: During functional activity Standing balance-Leahy Scale: Fair Standing balance comment: Able to perform static standing without UE support, no sway. Requires BUE support onR W for balance/safety during dynamic activities and walking.                             Pertinent Vitals/Pain Pain Assessment: 0-10 Pain Score: 4  Pain Location: back at surgical site Pain Descriptors / Indicators: Sore;Aching Pain Intervention(s): Monitored during session;Repositioned    Home Living Family/patient expects to be discharged to:: Private residence Living Arrangements: Spouse/significant other Available Help at Discharge: Family;Available 24 hours/day (Wife works from home) Type of Home: House Home Access: Stairs to enter Entrance Stairs-Rails: None (Has door to hold onto) Entrance Stairs-Number of Steps: 2 [place]: Two level;Able to live on main level with bedroom/bathroom Home Equipment: Kasandra Knudsen - single point      Prior Function Level of Independence: Independent  Hand Dominance        Extremity/Trunk  Assessment   Upper Extremity Assessment: Defer to OT evaluation           Lower Extremity Assessment: Generalized weakness ([facility].)         Communication   Communication: No difficulties  Cognition Arousal/Alertness: Awake/alert Behavior During Therapy: WFL for tasks assessed/performed Overall Cognitive Status: Within Functional Limits for tasks assessed                      General Comments General comments (skin integrity, edema, etc.): Discussed safety techniques and how to safely negotiate steps to enter home with wife and patient.     Exercises        Assessment/Plan    PT Assessment Patient needs continued PT services  PT Diagnosis Generalized weakness;Acute pain   PT Problem List Decreased strength;Pain;Decreased balance;Decreased mobility;Decreased knowledge of precautions  PT Treatment Interventions Balance training;Gait training;Neuromuscular re-education;Stair training;DME instruction;Patient/family education;Therapeutic activities;Therapeutic exercise   PT Goals (Current goals can be found in the Care Plan section) Acute Rehab PT Goals Patient Stated Goal: to return to independence and work PT Goal Formulation: With patient Time For Goal Achievement: 07/06/14 Potential to Achieve Goals: Good    Frequency Min 5X/week   Barriers to discharge        Co-evaluation               End of Session Equipment Utilized During Treatment: Gait belt Activity Tolerance: Patient tolerated treatment well Patient left: in bed;with call bell/phone within reach;with bed alarm set;with family/visitor present Nurse Communication: Mobility status         Time: 4920-1007 PT Time Calculation (min) (ACUTE ONLY): 25 min   Charges:   PT Evaluation $Initial PT Evaluation Tier I: 1 Procedure PT Treatments $Gait Training: 8-22 mins   PT G CodesCandy Sledge A 06/22/2014, 11:37 AM  Candy Sledge, [place], DPT [phone]

## 2014-06-23 LAB — GLUCOSE, CAPILLARY
GLUCOSE-CAPILLARY: 143 mg/dL — AB (ref 70–99)
Glucose-Capillary: 182 mg/dL — ABNORMAL HIGH (ref 70–99)

## 2014-06-23 MED ORDER — OXYCODONE-ACETAMINOPHEN 5-325 MG PO TABS: 1.0000 | ORAL_TABLET | Freq: Four times a day (QID) | ORAL | Status: DC | PRN

## 2014-06-23 MED ORDER — DIAZEPAM 5 MG PO TABS: 5.0000 mg | ORAL_TABLET | Freq: Four times a day (QID) | ORAL | Status: DC | PRN

## 2014-06-23 NOTE — Progress Notes (Signed)
Physical Therapy Treatment Patient Details Name: Dennis Vasquez MRN: [id number] DOB: 01/22/1946 Today's Date: 06/23/2014    History of Present Illness Patient is a 68 y/o male s/p L3-4 PLIF. PMH- HTN, DM, PNA, COPD, CAD and MI.    PT Comments    Pt progressing well with mobility.  Answered pt and wife's questions related to mobility.   They feel ready for DC home today.   Follow Up Recommendations  No PT follow up;Supervision for mobility/OOB     Equipment Recommendations  Rolling walker with 5" wheels    Recommendations for Other Services       Precautions / Restrictions Precautions Precautions: Back Precaution Booklet Issued: Yes (comment) Precaution Comments: Reviewed precautions Required Braces or Orthoses: Spinal Brace Spinal Brace: Lumbar corset;Applied in sitting position Restrictions Weight Bearing Restrictions: No    Mobility  Bed Mobility Overal bed mobility: Modified Independent Bed Mobility: Rolling;Sidelying to Sit;Sit to Sidelying Rolling: Modified independent (Device/Increase time) Sidelying to sit: Modified independent (Device/Increase time)     Sit to sidelying: Modified independent (Device/Increase time) General bed mobility comments: Pt able to demonstrate log rolling and bed mobility without use of rails.    Transfers Overall transfer level: Needs assistance Equipment used: Rolling walker (2 wheeled) Transfers: Sit to/from Stand Sit to Stand: Supervision         General transfer comment: demonstrated proper hand placement  Ambulation/Gait Ambulation/Gait assistance: Supervision Ambulation Distance (Feet): 150 Feet Assistive device: Rolling walker (2 wheeled) Gait Pattern/deviations: Step-through pattern   Gait velocity interpretation: at or above normal speed for age/gender General Gait Details: Gait speed much improved today.  Standing straight up in RW.  No dyspnea.   Stairs            Wheelchair Mobility    Modified  Rankin (Stroke Patients Only)       Balance                                    Cognition Arousal/Alertness: Awake/alert Behavior During Therapy: WFL for tasks assessed/performed Overall Cognitive Status: Within Functional Limits for tasks assessed                      Exercises      General Comments General comments (skin integrity, edema, etc.): Educated and demonstrated car transfer into [place]. Educated on progression of activity  and walking upon DC.  Wife present for session. Pt ifeels ready for DC. Will need a RW for home.        Pertinent Vitals/Pain Pain Assessment: No/denies pain Pain Intervention(s): Monitored during session    Home Living                      Prior Function            PT Goals (current goals can now be found in the care plan section) Progress towards PT goals: Progressing toward goals    Frequency  Min 5X/week    PT Plan Current plan remains appropriate    Co-evaluation             End of Session Equipment Utilized During Treatment: Back brace Activity Tolerance: Patient tolerated treatment well Patient left: in bed;with call bell/phone within reach;with family/visitor present     Time: 5956-3875 PT Time Calculation (min) (ACUTE ONLY): 24 min  Charges:  $Gait Training: 23-37 mins  G Codes:      Melvern Banker 2014/07/01, 11:06 AM  Lavonia Dana, PT  [phone] 01-Jul-2014

## 2014-06-23 NOTE — Care Management Note (Signed)
    Page 1 of 1   06/23/2014     11:11:56 AM CARE MANAGEMENT NOTE 06/23/2014  Patient:  Dennis, [name]   Account Number:  [id number]  Date Initiated:  06/23/2014  Documentation initiated by:  [facility]  Subjective/Objective Assessment:   adm: S/P lumbar spinal fusion     Action/Plan:   discharge planning   Anticipated DC Date:  06/23/2014   Anticipated DC Plan:  [place]  CM consult      Choice offered to / List presented to:     DME arranged  Vassie Moselle      DME agency  [place].        Status of service:  Completed, signed off Medicare Important Message given?   (If response is "NO", the following Medicare IM given date fields will be blank) Date Medicare IM given:   Medicare IM given by:   Date Additional Medicare IM given:   Additional Medicare IM given by:    Discharge Disposition:  HOME/SELF CARE  Per UR Regulation:    If discussed at Long Length of Stay Meetings, dates discussed:    Comments:  06/23/14 11:10 CM received call to please arange for a rolling walker.  CM called [facility] DME rep, Jeneen Rinks to please deliver a rolling walker to room prior to discharge.  No follow up PT recc.  No other CM needs were communicated. Mariane Masters, BSN, [place].

## 2014-06-23 NOTE — Discharge Summary (Signed)
Physician Discharge Summary  Patient ID: EUSTACE HUR MRN: [id number] DOB/AGE: 1946/05/05 68 y.o.  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 Geffre was admitted for a recurrent HNP at L3/4 with retrolisthesis. He was taken to the operating room and underwent an uncomplicated lumbar discetomy and fusion. Post operatively he is ambulating, tolerating a regulAr diet, and voiding. His wound is clean, dry, and without signs of infection. He is ready to be discharged.   Treatments: surgery: 1. Redo Decompressive lumbar laminectomy L3-4 requiring more work than would be required for a simple exposure of the disk for PLIF in order to adequately decompress the neural elements and address the spinal stenosis 2. Posterior lumbar interbody fusion L3-4 using PEEK interbody cages packed with morcellized allograft and autograft 3. Posterior fixation L3-4 using cortical pedicle screws.    Discharge Exam: Blood pressure 137/65, pulse 75, temperature 98.2 F (36.8 C), temperature source Oral, resp. rate 18, height 6\' 3"  (1.905 m), weight 145.633 kg (321 lb 1 oz), SpO2 94 %. General appearance: alert, cooperative, appears stated age and no distress Neurologic: Alert and oriented X 3, normal strength and tone. Normal symmetric reflexes. Normal coordination and gait  Disposition: 01-Home or Self Care Herniated Nucleus Pulposus Discharge Instructions    Walker rolling    Complete by:  As directed             Medication List    TAKE these medications        amLODipine-benazepril 10-40 MG per capsule  Commonly known as:  LOTREL  Take 1  capsule by mouth daily.     aspirin 81 MG tablet  Take 81 mg by mouth daily.     cloNIDine 0.3 MG tablet  Commonly known as:  CATAPRES  TAKE 1 TABLET BY MOUTH THREE TIMES DAILY     diazepam 5 MG tablet  Commonly known as:  VALIUM  Take 1 tablet (5 mg total) by mouth every 6 (six) hours as needed for anxiety.     Fish Oil 1200 MG Caps  Take 1,200 mg by mouth 2 (two) times daily.     furosemide 40 MG tablet  Commonly known as:  LASIX  Take 40 mg by mouth 2 (two) times daily.     glucose blood test strip  Inject 1 each into the vein 3 (three) times daily. Use as instructed     hydrALAZINE 100 MG tablet  Commonly known as:  APRESOLINE  Take 1 tablet (100 mg total) by mouth 3 (three) times daily.     HYDROcodone-homatropine 5-1.5 MG/5ML syrup  Commonly known as:  HYCODAN  Take 5 mLs by mouth every 8 (eight) hours as needed for cough.     insulin glargine 100 unit/mL Sopn  Commonly known as:  LANTUS  Inject 60 Units into the skin every morning.     insulin lispro 100 UNIT/ML injection  Commonly known as:  HUMALOG  Inject 30-40 Units into the skin 3 (three) times daily. Inject 30 units with breakfast and lunch and 40 units with dinner     Insulin Pen Needle 32G X 4 MM Misc  Inject 4 x a day     KLOR-CON M20 20  MEQ tablet  Generic drug:  potassium chloride SA  Take 20 mEq by mouth 2 (two) times daily.     loratadine 10 MG tablet  Commonly known as:  CLARITIN  Take 10 mg by mouth daily.     multivitamin tablet  Take 1 tablet by mouth daily.     nebivolol 5 MG tablet  Commonly known as:  BYSTOLIC  Take 1 tablet (5 mg total) by mouth daily.     omeprazole 20 MG capsule  Commonly known as:  PRILOSEC  Take 1 capsule (20 mg total) by mouth daily.     ondansetron 4 MG tablet  Commonly known as:  ZOFRAN  Take 1 tablet (4 mg total) by mouth every 6 (six) hours.     oxyCODONE-acetaminophen 5-325 MG per tablet  Commonly known as:  ROXICET  Take 1 tablet by mouth every  8 (eight) hours as needed for severe pain.     oxyCODONE-acetaminophen 5-325 MG per tablet  Commonly known as:  ROXICET  Take 1 tablet by mouth every 6 (six) hours as needed for severe pain.     pravastatin 40 MG tablet  Commonly known as:  PRAVACHOL  Take 1 tablet (40 mg total) by mouth daily.     tiZANidine 4 MG tablet  Commonly known as:  ZANAFLEX  Take 1 tablet by mouth 4 (four) times daily as needed for muscle spasms.     zolpidem 10 MG tablet  Commonly known as:  AMBIEN  Take 1 tablet (10 mg total) by mouth at bedtime as needed. For sleep           Follow-up Information    Follow up with JONES,DAVID S, MD In 2 weeks.   Specialty:  Neurosurgery   Why:  call office to make an appointment   Contact information:   [address] [place] [address] [phone]       Signed: Winfield Cunas 06/23/2014, 10:33 AM

## 2014-06-23 NOTE — Progress Notes (Signed)
Orders for colace and senokot placed for mild constipation.

## 2014-06-23 NOTE — Progress Notes (Addendum)
Occupational Therapy Treatment Patient Details Name: Dennis Vasquez MRN: [id number] DOB: Mar 12, 1946 Today's Date: 06/23/2014    History of present illness Patient is a 68 y/o male s/p L3-4 PLIF. PMH- HTN, DM, PNA, COPD, CAD and MI.   OT comments  Pt progressing. Education provided to pt and spouse.   Follow Up Recommendations  No OT follow up;Supervision - Intermittent    Equipment Recommendations  Other (comment) (bariatric shower chair)    Recommendations for Other Services      Precautions / Restrictions Precautions Precautions: Back Precaution Booklet Issued: No Precaution Comments: Pt able to state 3/3 precautions Required Braces or Orthoses: Spinal Brace Spinal Brace: Lumbar corset;Applied in sitting position Restrictions Weight Bearing Restrictions: No       Mobility Bed Mobility Overal bed mobility: Modified Independent Bed Mobility: Rolling;Sidelying to Sit;Sit to Sidelying Rolling: Modified independent (Device/Increase time) Sidelying to sit: Modified independent (Device/Increase time)     Sit to sidelying: Modified independent (Device/Increase time)   Transfers Overall transfer level: Needs assistance Equipment used: Rolling walker (2 wheeled) Transfers: Sit to/from Stand Sit to Stand: Min guard;Supervision         General transfer comment: Cues for hand placement.     Balance                                   ADL Overall ADL's : Needs assistance/impaired     Grooming: Wash/dry face;Oral care;Standing;Set up;Supervision/safety           Upper Body Dressing : Sitting;Minimal assistance;Standing (helped adjust brace)   Lower Body Dressing: Minimal assistance;Sit to/from stand;With adaptive equipment Lower Body Dressing Details (indicate cue type and reason): assist with shoes Toilet Transfer: Supervision/safety;Min guard;Regular Toilet;Grab bars;RW (Min guard for stand to sit transfer.)       Tub/ Shower Transfer: Min  guard;Ambulation;Rolling walker   Functional mobility during ADLs: Supervision/safety;Rolling walker;Min guard (Min guard for shower transfer) General ADL Comments: Discussed incorporating back precautions into functional activities. Educated on back brace. Educated/practiced with  AE and explained where to purchase. Explained what pt could use for toilet aide for hygiene (and also that gift shop has one). Educated on car transfer technique. practiced shower transfer. Educated on Stage manager. Explained options for shower chair.  Reviewed use of cup for oral care and placement of grooming items to avoid breaking precautions. Discussed getting closer to sink to avoid breaking precautions or parking walker to side so pt can get closer. Discussed positioning of sitting in chair at home.      Vision                    Perception     Praxis      Cognition  Awake/Alert Behavior During Therapy: WFL for tasks assessed/performed Overall Cognitive Status: Within Functional Limits for tasks assessed                       Extremity/Trunk Assessment               Exercises     Shoulder Instructions       General Comments      Pertinent Vitals/ Pain       Pain Assessment: No/denies pain  Home Living  Prior Functioning/Environment              Frequency Min 2X/week     Progress Toward Goals  OT Goals(current goals can now be found in the care plan section)  Progress towards OT goals: Progressing toward goals  Acute Rehab OT Goals Patient Stated Goal: go home OT Goal Formulation: With patient Time For Goal Achievement: 06/29/14 Potential to Achieve Goals: Good ADL Goals Pt Will Perform Grooming: with modified independence;standing Pt Will Perform Upper Body Dressing: with modified independence;sitting Pt Will Perform Lower Body Dressing: with modified independence;sit to/from stand Pt Will  Transfer to Toilet: with modified independence;ambulating Pt Will Perform Toileting - Clothing Manipulation and hygiene: with modified independence;sit to/from stand;with adaptive equipment Pt Will Perform Tub/Shower Transfer: Shower transfer;with supervision;ambulating;3 in 1;rolling walker  Plan Discharge plan remains appropriate    Co-evaluation                 End of Session Equipment Utilized During Treatment: Gait belt;Rolling walker;Back brace   Activity Tolerance Patient tolerated treatment well   Patient Left in bed;with call bell/phone within reach;with family/visitor present   Nurse Communication Other (comment) (recommending shower chair)        Time: 3734-2876 OT Time Calculation (min): 32 min  Charges: OT General Charges $OT Visit: 1 Procedure OT Treatments $Self Care/Home Management : 23-37 mins   Benito Mccreedy OTR/L [phone] 06/23/2014, 12:05 PM

## 2014-06-23 NOTE — Progress Notes (Signed)
Patient discharged home with wife. IV removed. Discharge instructions reviewed and discussed with patient and wife. Patient and wife state they understand discharge instructions and medications.

## 2014-06-23 NOTE — Discharge Instructions (Signed)
Spinal Fusion °Care After °Refer to this sheet in the next few weeks. These instructions provide you with information on caring for yourself after your procedure. Your caregiver may also give you more specific instructions. Your treatment has been planned according to current medical practices, but problems sometimes occur. Call your caregiver if you have any problems or questions after your procedure. °HOME CARE INSTRUCTIONS  °· Take whatever pain medicine has been prescribed by your caregiver. Do not take over-the-counter pain medicine unless directed otherwise by your caregiver. °· Do not drive if you are taking narcotic pain medicines. °· Change your bandage (dressing) if necessary or as directed by your caregiver. °· Do not get your surgical cut (incision) wet. After a few days you may take quick showers (rather than baths), but keep your incision clean and dry. Covering the incision with plastic wrap while you shower should keep your incision dry. A few weeks after surgery, once your incision has healed and your caregiver says it is okay, you can take baths or go swimming. °· If you have been prescribed medicine to prevent your blood from clotting, follow the directions carefully. °· Check the area around your incision often. Look for redness and swelling. Also, look for anything leaking from your wound. You can use a mirror or have a family member inspect your incision if it is in a place where it is difficult for you to see. °· Ask your caregiver what activities you should avoid and for how long. °· Walk as much as possible. °· Do not lift anything heavier than 10 pounds (4.5 kilograms) until your caregiver says it is safe. °· Do not twist or bend for a few weeks. Try not to pull on things. Avoid sitting for long periods of time. Change positions at least every hour. °· Ask your caregiver what kinds of exercise you should do to make your back stronger and when you should begin doing these exercises. °SEEK  IMMEDIATE MEDICAL CARE IF:  °· Pain suddenly becomes much worse. °· The incision area is red, swollen, bleeding, or leaking fluid. °· Your legs or feet become increasingly painful, numb, weak, or swollen. °· You have trouble controlling urination or bowel movements. °· You have trouble breathing. °· You have chest pain. °· You have a fever. °MAKE SURE YOU: °· Understand these instructions. °· Will watch your condition. °· Will get help right away if you are not doing well or get worse. °Document Released: 01/08/2005 Document Revised: 09/13/2011 Document Reviewed: 09/03/2010 °Exit[employer]® Patient Information ©2015 [employer]. This information is not intended to replace advice given to you by your health care provider. Make sure you discuss any questions you have with your health care provider. ° °

## 2014-06-23 NOTE — Progress Notes (Signed)
Utilization Review Completed.Dennis Vasquez T12/20/2015  

## 2014-06-24 ENCOUNTER — Encounter (HOSPITAL_COMMUNITY): Payer: Self-pay | Admitting: Neurological Surgery

## 2014-07-22 ENCOUNTER — Ambulatory Visit: Payer: Medicare Other | Admitting: Cardiology

## 2014-08-06 ENCOUNTER — Ambulatory Visit (HOSPITAL_COMMUNITY)
Admission: RE | Admit: 2014-08-06 | Discharge: 2014-08-06 | Disposition: A | Discharge status: Home / Self Care | Payer: 59 | Source: Ambulatory Visit | Attending: Neurological Surgery | Admitting: Neurological Surgery

## 2014-08-06 ENCOUNTER — Other Ambulatory Visit (HOSPITAL_COMMUNITY): Payer: Self-pay | Admitting: Neurological Surgery

## 2014-08-06 DIAGNOSIS — M79606 Pain in leg, unspecified: Secondary | ICD-10-CM | POA: Diagnosis not present

## 2014-08-06 DIAGNOSIS — M7989 Other specified soft tissue disorders: Secondary | ICD-10-CM

## 2014-08-06 NOTE — Progress Notes (Signed)
VASCULAR LAB PRELIMINARY  PRELIMINARY  PRELIMINARY  PRELIMINARY  Bilateral lower extremity venous duplex completed.    Preliminary report:  Negative acute DVT bilaterally.  Small Baker's Cyst left popliteal fossa.  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  Lindwood Coke, RVT 08/06/2014, 4:34 PM

## 2014-08-18 ENCOUNTER — Other Ambulatory Visit: Payer: Self-pay | Admitting: Physician Assistant

## 2014-08-20 NOTE — Telephone Encounter (Signed)
Medication refilled per protocol. 

## 2014-08-21 ENCOUNTER — Other Ambulatory Visit: Payer: Self-pay | Admitting: Family Medicine

## 2014-08-21 MED ORDER — INSULIN LISPRO 100 UNIT/ML ~~LOC~~ SOLN: SUBCUTANEOUS | Status: DC

## 2014-08-21 MED ORDER — INSULIN LISPRO 100 UNIT/ML ~~LOC~~ SOLN: 30.0000 [IU] | Freq: Three times a day (TID) | SUBCUTANEOUS | Status: DC

## 2014-08-21 MED ORDER — INSULIN LISPRO 100 UNIT/ML (KWIKPEN): PEN_INJECTOR | SUBCUTANEOUS | Status: DC

## 2014-08-21 NOTE — Telephone Encounter (Signed)
Med sent to pharm 

## 2014-08-22 ENCOUNTER — Other Ambulatory Visit: Payer: Self-pay | Admitting: Family Medicine

## 2014-08-22 DIAGNOSIS — K219 Gastro-esophageal reflux disease without esophagitis: Secondary | ICD-10-CM

## 2014-08-23 MED ORDER — OMEPRAZOLE 20 MG PO CPDR: 20.0000 mg | DELAYED_RELEASE_CAPSULE | Freq: Every day | ORAL | Status: DC

## 2014-08-23 NOTE — Telephone Encounter (Signed)
Pt has appt 08/26/14.  LOV 05/06/14  LRF Zolpidem 09/24/13 #30 +2  OK refill?

## 2014-08-23 NOTE — Telephone Encounter (Signed)
Medication refilled per protocol. 

## 2014-08-23 NOTE — Telephone Encounter (Signed)
ok 

## 2014-08-26 ENCOUNTER — Encounter: Payer: Self-pay | Admitting: Family Medicine

## 2014-08-26 ENCOUNTER — Ambulatory Visit (INDEPENDENT_AMBULATORY_CARE_PROVIDER_SITE_OTHER): Payer: 59 | Admitting: Family Medicine

## 2014-08-26 VITALS — BP 140/80 | HR 84 | Temp 97.9°F | Resp 20 | Ht 75.0 in | Wt 332.0 lb

## 2014-08-26 DIAGNOSIS — R0600 Dyspnea, unspecified: Secondary | ICD-10-CM

## 2014-08-26 DIAGNOSIS — I2510 Atherosclerotic heart disease of native coronary artery without angina pectoris: Secondary | ICD-10-CM

## 2014-08-26 DIAGNOSIS — L723 Sebaceous cyst: Secondary | ICD-10-CM

## 2014-08-26 MED ORDER — HYDRALAZINE HCL 100 MG PO TABS: 100.0000 mg | ORAL_TABLET | Freq: Three times a day (TID) | ORAL | Status: DC

## 2014-08-26 MED ORDER — LORATADINE 10 MG PO TABS: 10.0000 mg | ORAL_TABLET | Freq: Every day | ORAL | Status: DC

## 2014-08-26 MED ORDER — ZOLPIDEM TARTRATE 10 MG PO TABS: 10.0000 mg | ORAL_TABLET | Freq: Every evening | ORAL | Status: DC | PRN

## 2014-08-26 MED ORDER — NEBIVOLOL HCL 5 MG PO TABS: 5.0000 mg | ORAL_TABLET | Freq: Every day | ORAL | Status: DC

## 2014-08-26 MED ORDER — GLUCOSE BLOOD VI STRP: ORAL_STRIP | Status: DC

## 2014-08-27 ENCOUNTER — Encounter: Payer: Self-pay | Admitting: Family Medicine
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
## 2014-09-24 ENCOUNTER — Other Ambulatory Visit: Payer: 59

## 2014-09-24 ENCOUNTER — Other Ambulatory Visit: Payer: Self-pay | Admitting: Family Medicine

## 2014-09-24 DIAGNOSIS — I10 Essential (primary) hypertension: Secondary | ICD-10-CM

## 2014-09-24 DIAGNOSIS — E119 Type 2 diabetes mellitus without complications: Secondary | ICD-10-CM

## 2014-09-24 DIAGNOSIS — E785 Hyperlipidemia, unspecified: Secondary | ICD-10-CM

## 2014-09-24 LAB — COMPLETE METABOLIC PANEL WITH GFR
ALBUMIN: 4 g/dL (ref 3.5–5.2)
ALK PHOS: 46 U/L (ref 39–117)
ALT: 26 U/L (ref 0–53)
AST: 21 U/L (ref 0–37)
BUN: 22 mg/dL (ref 6–23)
CHLORIDE: 103 meq/L (ref 96–112)
CO2: 29 mEq/L (ref 19–32)
Calcium: 9.1 mg/dL (ref 8.4–10.5)
Creat: 1.22 mg/dL (ref 0.50–1.35)
GFR, EST NON AFRICAN AMERICAN: 61 mL/min
GFR, Est African American: 71 mL/min
GLUCOSE: 129 mg/dL — AB (ref 70–99)
POTASSIUM: 4.4 meq/L (ref 3.5–5.3)
SODIUM: 139 meq/L (ref 135–145)
TOTAL PROTEIN: 6.1 g/dL (ref 6.0–8.3)
Total Bilirubin: 0.5 mg/dL (ref 0.2–1.2)

## 2014-09-24 LAB — LIPID PANEL
CHOL/HDL RATIO: 3.9 ratio
Cholesterol: 130 mg/dL (ref 0–200)
HDL: 33 mg/dL — ABNORMAL LOW (ref >=40)
LDL Cholesterol: 65 mg/dL (ref 0–99)
TRIGLYCERIDES: 158 mg/dL — AB (ref <150)
VLDL: 32 mg/dL (ref 0–40)

## 2014-09-24 LAB — HEMOGLOBIN A1C
HEMOGLOBIN A1C: 6.4 % — AB (ref <5.7)
MEAN PLASMA GLUCOSE: 137 mg/dL — AB (ref <117)

## 2014-09-25 LAB — CBC WITH DIFFERENTIAL/PLATELET
BASOS PCT: 0 % (ref 0–1)
Basophils Absolute: 0 10*3/uL (ref 0.0–0.1)
EOS ABS: 0.2 10*3/uL (ref 0.0–0.7)
Eosinophils Relative: 2 % (ref 0–5)
HCT: 43.5 % (ref 39.0–52.0)
Hemoglobin: 13.9 g/dL (ref 13.0–17.0)
Lymphocytes Relative: 23 % (ref 12–46)
Lymphs Abs: 2 10*3/uL (ref 0.7–4.0)
MCH: 28.8 pg (ref 26.0–34.0)
MCHC: 32 g/dL (ref 30.0–36.0)
MCV: 90.1 fL (ref 78.0–100.0)
MONO ABS: 0.8 10*3/uL (ref 0.1–1.0)
MPV: 9.3 fL (ref 8.6–12.4)
Monocytes Relative: 9 % (ref 3–12)
NEUTROS ABS: 5.7 10*3/uL (ref 1.7–7.7)
NEUTROS PCT: 66 % (ref 43–77)
Platelets: 284 10*3/uL (ref 150–400)
RBC: 4.83 MIL/uL (ref 4.22–5.81)
RDW: 14.2 % (ref 11.5–15.5)
WBC: 8.7 10*3/uL (ref 4.0–10.5)

## 2014-09-27 ENCOUNTER — Ambulatory Visit (INDEPENDENT_AMBULATORY_CARE_PROVIDER_SITE_OTHER): Payer: 59 | Admitting: Family Medicine

## 2014-09-27 ENCOUNTER — Encounter: Payer: Self-pay | Admitting: Family Medicine

## 2014-09-27 VITALS — BP 128/70 | HR 68 | Temp 98.1°F | Resp 20 | Ht 74.0 in | Wt 330.0 lb

## 2014-09-27 DIAGNOSIS — Z0000 Encounter for general adult medical examination without abnormal findings: Secondary | ICD-10-CM | POA: Diagnosis not present

## 2014-09-27 MED ORDER — DIAZEPAM 5 MG PO TABS: 5.0000 mg | ORAL_TABLET | Freq: Four times a day (QID) | ORAL | Status: DC | PRN
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
## 2014-09-28 LAB — PSA: PSA: 0.45 ng/mL (ref <=4.00)

## 2014-10-01 ENCOUNTER — Ambulatory Visit: Payer: Medicare Other | Admitting: Cardiology

## 2014-10-02 ENCOUNTER — Encounter: Payer: Self-pay | Admitting: Family Medicine

## 2014-10-09 ENCOUNTER — Other Ambulatory Visit: Payer: Self-pay | Admitting: Family Medicine

## 2014-10-09 MED ORDER — INSULIN LISPRO 100 UNIT/ML (KWIKPEN): PEN_INJECTOR | SUBCUTANEOUS | Status: DC

## 2014-10-12 ENCOUNTER — Other Ambulatory Visit: Payer: Self-pay | Admitting: Family Medicine

## 2014-11-19 ENCOUNTER — Other Ambulatory Visit: Payer: Self-pay | Admitting: Family Medicine

## 2014-11-29 ENCOUNTER — Other Ambulatory Visit: Payer: Self-pay | Admitting: Family Medicine

## 2014-11-29 DIAGNOSIS — K219 Gastro-esophageal reflux disease without esophagitis: Secondary | ICD-10-CM

## 2014-11-29 DIAGNOSIS — I2510 Atherosclerotic heart disease of native coronary artery without angina pectoris: Secondary | ICD-10-CM

## 2014-11-29 DIAGNOSIS — R0600 Dyspnea, unspecified: Secondary | ICD-10-CM

## 2014-11-29 MED ORDER — OMEPRAZOLE 20 MG PO CPDR: 20.0000 mg | DELAYED_RELEASE_CAPSULE | Freq: Every day | ORAL | Status: DC

## 2014-11-29 MED ORDER — FUROSEMIDE 40 MG PO TABS: 40.0000 mg | ORAL_TABLET | Freq: Two times a day (BID) | ORAL | Status: DC

## 2014-11-29 MED ORDER — PRAVASTATIN SODIUM 40 MG PO TABS: 40.0000 mg | ORAL_TABLET | Freq: Every day | ORAL | Status: DC

## 2014-11-29 MED ORDER — ZOLPIDEM TARTRATE 10 MG PO TABS: 10.0000 mg | ORAL_TABLET | Freq: Every evening | ORAL | Status: DC | PRN

## 2014-11-29 MED ORDER — LORATADINE 10 MG PO TABS: 10.0000 mg | ORAL_TABLET | Freq: Every day | ORAL | Status: DC

## 2014-11-29 MED ORDER — POTASSIUM CHLORIDE CRYS ER 20 MEQ PO TBCR: 20.0000 meq | EXTENDED_RELEASE_TABLET | Freq: Two times a day (BID) | ORAL | Status: DC

## 2014-11-29 MED ORDER — INSULIN LISPRO 100 UNIT/ML (KWIKPEN): PEN_INJECTOR | SUBCUTANEOUS | Status: DC

## 2014-11-29 MED ORDER — AMLODIPINE BESY-BENAZEPRIL HCL 10-40 MG PO CAPS: 1.0000 | ORAL_CAPSULE | Freq: Every day | ORAL | Status: DC

## 2014-11-29 MED ORDER — NEBIVOLOL HCL 5 MG PO TABS: 5.0000 mg | ORAL_TABLET | Freq: Every day | ORAL | Status: DC

## 2014-11-29 MED ORDER — INSULIN GLARGINE 100 UNITS/ML SOLOSTAR PEN: 75.0000 [IU] | PEN_INJECTOR | Freq: Every morning | SUBCUTANEOUS | Status: DC

## 2014-11-29 MED ORDER — HYDRALAZINE HCL 100 MG PO TABS: 100.0000 mg | ORAL_TABLET | Freq: Three times a day (TID) | ORAL | Status: DC

## 2014-11-29 MED ORDER — CLONIDINE HCL 0.3 MG PO TABS: 0.3000 mg | ORAL_TABLET | Freq: Three times a day (TID) | ORAL | Status: DC

## 2014-11-29 NOTE — Telephone Encounter (Signed)
Meds sent to pharm except Valium - per dr. Dennard Schaumann will only do 30 supply.

## 2014-12-01 ENCOUNTER — Other Ambulatory Visit: Payer: Self-pay | Admitting: Family Medicine

## 2014-12-06 ENCOUNTER — Other Ambulatory Visit: Payer: Self-pay | Admitting: Family Medicine

## 2014-12-13 ENCOUNTER — Telehealth: Payer: Self-pay | Admitting: Family Medicine

## 2014-12-13 MED ORDER — GLUCOSE BLOOD VI STRP: ORAL_STRIP | Status: DC

## 2014-12-13 NOTE — Telephone Encounter (Signed)
Patient wife calling for refill of patient ultra test strips #25 - 90 day supply to [facility] [phone]

## 2014-12-13 NOTE — Telephone Encounter (Signed)
Sent to pharm as requested

## 2014-12-20 ENCOUNTER — Other Ambulatory Visit: Payer: Self-pay | Admitting: Family Medicine

## 2015-01-02 ENCOUNTER — Other Ambulatory Visit: Payer: Self-pay | Admitting: Family Medicine

## 2015-01-17 ENCOUNTER — Other Ambulatory Visit: Payer: Self-pay | Admitting: Family Medicine

## 2015-01-17 NOTE — Telephone Encounter (Signed)
Medication refilled per protocol. 

## 2015-01-22 ENCOUNTER — Other Ambulatory Visit: Payer: Self-pay | Admitting: Neurological Surgery

## 2015-01-22 DIAGNOSIS — M545 Low back pain: Secondary | ICD-10-CM

## 2015-01-27 ENCOUNTER — Other Ambulatory Visit: Payer: Self-pay | Admitting: Family Medicine

## 2015-02-04 ENCOUNTER — Ambulatory Visit
Admission: RE | Admit: 2015-02-04 | Discharge: 2015-02-04 | Disposition: A | Discharge status: Home / Self Care | Payer: 59 | Source: Ambulatory Visit | Attending: Neurological Surgery | Admitting: Neurological Surgery

## 2015-02-04 DIAGNOSIS — M545 Low back pain: Secondary | ICD-10-CM

## 2015-02-17 ENCOUNTER — Other Ambulatory Visit: Payer: Self-pay | Admitting: Family Medicine

## 2015-02-17 DIAGNOSIS — G4700 Insomnia, unspecified: Secondary | ICD-10-CM

## 2015-02-17 MED ORDER — ZOLPIDEM TARTRATE 10 MG PO TABS: 10.0000 mg | ORAL_TABLET | Freq: Every evening | ORAL | Status: DC | PRN

## 2015-02-18 ENCOUNTER — Telehealth: Payer: Self-pay | Admitting: Family Medicine

## 2015-02-18 ENCOUNTER — Other Ambulatory Visit: Payer: Self-pay | Admitting: Family Medicine

## 2015-02-18 NOTE — Telephone Encounter (Signed)
Med faxed to pharm

## 2015-02-18 NOTE — Progress Notes (Signed)
Can we figure out what is needed, I do not understand this note.

## 2015-02-18 NOTE — Telephone Encounter (Signed)
ok 

## 2015-02-18 NOTE — Progress Notes (Signed)
What does he need to have filled?

## 2015-02-18 NOTE — Telephone Encounter (Signed)
Requesting a refill on Ambien - ? OK to Refill

## 2015-02-19 NOTE — Telephone Encounter (Signed)
?   OK to Refill  

## 2015-02-20 NOTE — Telephone Encounter (Signed)
ok 

## 2015-03-25 ENCOUNTER — Telehealth: Payer: Self-pay | Admitting: *Deleted

## 2015-03-25 NOTE — Telephone Encounter (Signed)
Received call from patient's wife stating she is returning call from this office. Transferred her call to Preston Fleeting, new pt coordinator who had left vm re: referral.

## 2015-04-15 ENCOUNTER — Ambulatory Visit: Payer: Medicare Other | Admitting: Neurology

## 2015-04-24 ENCOUNTER — Ambulatory Visit: Payer: 59 | Admitting: Neurology

## 2015-04-30 ENCOUNTER — Ambulatory Visit: Payer: 59 | Admitting: Neurology

## 2015-05-08 ENCOUNTER — Ambulatory Visit (INDEPENDENT_AMBULATORY_CARE_PROVIDER_SITE_OTHER): Payer: 59 | Admitting: Neurology

## 2015-05-08 ENCOUNTER — Encounter: Payer: Self-pay | Admitting: Neurology

## 2015-05-08 VITALS — BP 111/71 | HR 69 | Ht 74.0 in | Wt 325.0 lb

## 2015-05-08 DIAGNOSIS — R531 Weakness: Secondary | ICD-10-CM

## 2015-05-08 DIAGNOSIS — R29898 Other symptoms and signs involving the musculoskeletal system: Secondary | ICD-10-CM | POA: Diagnosis not present

## 2015-05-08 DIAGNOSIS — R252 Cramp and spasm: Secondary | ICD-10-CM

## 2015-05-08 DIAGNOSIS — G6181 Chronic inflammatory demyelinating polyneuritis: Secondary | ICD-10-CM | POA: Diagnosis not present

## 2015-05-08 DIAGNOSIS — G609 Hereditary and idiopathic neuropathy, unspecified: Secondary | ICD-10-CM | POA: Diagnosis not present

## 2015-05-08 DIAGNOSIS — I2510 Atherosclerotic heart disease of native coronary artery without angina pectoris: Secondary | ICD-10-CM

## 2015-05-08 DIAGNOSIS — E538 Deficiency of other specified B group vitamins: Secondary | ICD-10-CM

## 2015-05-08 DIAGNOSIS — E5111 Dry beriberi: Secondary | ICD-10-CM

## 2015-05-08 DIAGNOSIS — E531 Pyridoxine deficiency: Secondary | ICD-10-CM

## 2015-05-08 DIAGNOSIS — G63 Polyneuropathy in diseases classified elsewhere: Secondary | ICD-10-CM

## 2015-05-08 DIAGNOSIS — R200 Anesthesia of skin: Secondary | ICD-10-CM
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
## 2015-05-08 NOTE — Patient Instructions (Signed)
Remember to drink plenty of fluid, eat healthy meals and do not skip any meals. Try to eat protein with a every meal and eat a healthy snack such as fruit or nuts in between meals. Try to keep a regular sleep-wake schedule and try to exercise daily, particularly in the form of walking, 20-30 minutes a day, if you can.   As far as diagnostic testing: labs, lumbar puncture  I would like to see you back after workup, sooner if we need to. Please call us with any interim questions, concerns, problems, updates or refill requests.   Please also call us for any test results so we can go over those with you on the phone.  My clinical assistant and will answer any of your questions and relay your messages to me and also relay most of my messages to you.   Our phone number is [phone]. We also have an after hours call service for urgent matters and there is a physician on-call for urgent questions. For any emergencies you know to call 911 or go to the nearest emergency room

## 2015-05-12 ENCOUNTER — Telehealth: Payer: Self-pay | Admitting: *Deleted

## 2015-05-12 ENCOUNTER — Other Ambulatory Visit: Payer: Self-pay | Admitting: Neurology

## 2015-05-12 DIAGNOSIS — R748 Abnormal levels of other serum enzymes: Secondary | ICD-10-CM

## 2015-05-12 LAB — COMPREHENSIVE METABOLIC PANEL
ALBUMIN: 4.4 g/dL (ref 3.6–4.8)
ALK PHOS: 53 IU/L (ref 39–117)
ALT: 33 IU/L (ref 0–44)
AST: 26 IU/L (ref 0–40)
Albumin/Globulin Ratio: 2 (ref 1.1–2.5)
BUN/Creatinine Ratio: 14 (ref 10–22)
BUN: 19 mg/dL (ref 8–27)
Bilirubin Total: 0.4 mg/dL (ref 0.0–1.2)
CALCIUM: 9.3 mg/dL (ref 8.6–10.2)
CO2: 25 mmol/L (ref 18–29)
CREATININE: 1.32 mg/dL — AB (ref 0.76–1.27)
Chloride: 102 mmol/L (ref 97–106)
GFR calc Af Amer: 64 mL/min/{1.73_m2} (ref >59)
GFR calc non Af Amer: 55 mL/min/{1.73_m2} — ABNORMAL LOW (ref >59)
GLOBULIN, TOTAL: 2.2 g/dL (ref 1.5–4.5)
Glucose: 99 mg/dL (ref 65–99)
Potassium: 3.8 mmol/L (ref 3.5–5.2)
SODIUM: 145 mmol/L — AB (ref 136–144)
Total Protein: 6.6 g/dL (ref 6.0–8.5)

## 2015-05-12 LAB — ANA COMPREHENSIVE PANEL
Anti JO-1: <0.2 AI (ref 0.0–0.9)
Centromere Ab Screen: <0.2 AI (ref 0.0–0.9)
Chromatin Ab SerPl-aCnc: <0.2 AI (ref 0.0–0.9)
ENA RNP Ab: <0.2 AI (ref 0.0–0.9)
ENA SSB (LA) Ab: <0.2 AI (ref 0.0–0.9)
SCL 70: 0.2 AI (ref 0.0–0.9)

## 2015-05-12 LAB — MULTIPLE MYELOMA PANEL, SERUM
ALBUMIN SERPL ELPH-MCNC: 3.8 g/dL (ref 2.9–4.4)
Albumin/Glob SerPl: 1.4 (ref 0.7–1.7)
Alpha 1: 0.2 g/dL (ref 0.0–0.4)
Alpha2 Glob SerPl Elph-Mcnc: 0.8 g/dL (ref 0.4–1.0)
B-GLOBULIN SERPL ELPH-MCNC: 1.1 g/dL (ref 0.7–1.3)
GAMMA GLOB SERPL ELPH-MCNC: 0.7 g/dL (ref 0.4–1.8)
Globulin, Total: 2.8 g/dL (ref 2.2–3.9)
IGG (IMMUNOGLOBIN G), SERUM: 808 mg/dL (ref 700–1600)
IGM (IMMUNOGLOBULIN M), SRM: 34 mg/dL (ref 20–172)
IgA/Immunoglobulin A, Serum: 173 mg/dL (ref 61–437)
M Protein SerPl Elph-Mcnc: 0.3 g/dL — ABNORMAL HIGH

## 2015-05-12 LAB — HEAVY METALS, BLOOD
Arsenic: 5 ug/L (ref 2–23)
Lead, Blood: 1 ug/dL (ref 0–19)
Mercury: NOT DETECTED ug/L (ref 0.0–14.9)

## 2015-05-12 LAB — ANA: ANA TITER 1: NEGATIVE

## 2015-05-12 LAB — CK: Total CK: 432 U/L — ABNORMAL HIGH (ref 24–204)

## 2015-05-12 LAB — B12 AND FOLATE PANEL: Vitamin B-12: 568 pg/mL (ref 211–946)

## 2015-05-12 LAB — VITAMIN B1: Thiamine: 277.9 nmol/L — ABNORMAL HIGH (ref 66.5–200.0)

## 2015-05-12 LAB — RHEUMATOID FACTOR

## 2015-05-12 LAB — METHYLMALONIC ACID, SERUM: METHYLMALONIC ACID: 201 nmol/L (ref 0–378)

## 2015-05-12 LAB — HIV ANTIBODY (ROUTINE TESTING W REFLEX): HIV SCREEN 4TH GENERATION: NONREACTIVE

## 2015-05-12 LAB — VITAMIN B6: Vitamin B6: 25.5 ug/L (ref 5.3–46.7)

## 2015-05-12 LAB — HEPATITIS C ANTIBODY

## 2015-05-12 LAB — RPR: RPR Ser Ql: NONREACTIVE

## 2015-05-12 LAB — TSH: TSH: 1.53 u[IU]/mL (ref 0.450–4.500)

## 2015-05-12 NOTE — Telephone Encounter (Signed)
Called wife. 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 Left message for her to call back. I put a recommendation for vascular wkup for PVD it in my note for his primary care as a suggestion, they should follow up with his pcp.   TSH wnl HIV, RPR, Hep C neg B12,mma,  folate, thiamine, RF,B6, heavy metals,ANA comprehensive panel  wnl CK elevated 432, will repeat with isoenzymes CMP with creatinine 1.32

## 2015-05-12 NOTE — Telephone Encounter (Signed)
-----   Message from Melvenia Beam, MD sent at 05/12/2015  8:04 AM EST ----- The labs were overall unremarkable. Let patient know his kidney creatinine was elevated(1.32 was 1.22 7 months ago) and he should follow up with his primary care.  His CK was mildly elevated, we should repeat that lab. I will order it and he can come it and get it done at his convenience.

## 2015-05-12 NOTE — Telephone Encounter (Signed)
LVM for pt wife to call back about results. Gave [facility] phone number and hours.

## 2015-05-12 NOTE — Telephone Encounter (Signed)
LVM for pt wife (POA) to call about results. Gave [facility] phone number and hours.

## 2015-05-12 NOTE — Telephone Encounter (Addendum)
Called pt wife back about results. Spoke with wife about Dr Jaynee Eagles message about lab work. She verbalized understanding. Knows to come in to repeat CK lab value. Gave [facility] lab hours. And knows to f.u with PCP about elevated creatinine at 1.32 (1.22, 7 months ago).   She was wondering about the circulation in his legs and checking on this since Dr Jaynee Eagles mentioned that his toes looked dark and he may have a circulation issue. Not sure if she should set something up, or if Dr Jaynee Eagles ordered anything. I told her I did not see referral/order in, but I will talk with Dr Jaynee Eagles and call her back. She verbalized understanding.   She also asked about negative rheumatoid factor and if it r/oGulllian barre syndrome.

## 2015-05-12 NOTE — Telephone Encounter (Signed)
Pt's wife returned Emma's call

## 2015-05-13 ENCOUNTER — Other Ambulatory Visit: Payer: Self-pay | Admitting: Family Medicine

## 2015-05-13 ENCOUNTER — Other Ambulatory Visit: Payer: Self-pay | Admitting: Neurology

## 2015-05-13 DIAGNOSIS — D472 Monoclonal gammopathy: Secondary | ICD-10-CM

## 2015-05-13 NOTE — Telephone Encounter (Signed)
Patient's wife is returning Dr. Cathren Laine call of 11/7. She can be reached at [phone].

## 2015-05-13 NOTE — Telephone Encounter (Signed)
Refill appropriate and filled per protocol. 

## 2015-05-13 NOTE — Telephone Encounter (Signed)
Called patient back.  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 I have referred him to hematology. Thanks.

## 2015-05-14 NOTE — Telephone Encounter (Signed)
Wife [name] returned call. Please call back [phone] (rings into home, it's a business number but this is the best number to reach her at)

## 2015-05-14 NOTE — Telephone Encounter (Signed)
Pt's wife, [name] called with alternate phone numbers to call back : [phone] or cell: [phone]

## 2015-05-15 ENCOUNTER — Telehealth: Payer: Self-pay | Admitting: Neurology

## 2015-05-15 ENCOUNTER — Other Ambulatory Visit: Payer: Self-pay | Admitting: Neurology

## 2015-05-15 DIAGNOSIS — L819 Disorder of pigmentation, unspecified: Secondary | ICD-10-CM

## 2015-05-15 DIAGNOSIS — M79605 Pain in left leg: Principal | ICD-10-CM

## 2015-05-15 DIAGNOSIS — M79604 Pain in right leg: Secondary | ICD-10-CM

## 2015-05-15 DIAGNOSIS — I739 Peripheral vascular disease, unspecified: Secondary | ICD-10-CM

## 2015-05-15 NOTE — Telephone Encounter (Signed)
LVM at [facility] Vascular department to call back to schedule pt for [place] ankle/brachial indices extremity. Gave pt name and DOBx2. Gave [facility] phone number and asked them to call back. Advised I will not be in the office tomorrow, but someone should be able to take the call.

## 2015-05-15 NOTE — Telephone Encounter (Signed)
I ordered an ultrasound anjle/brachial indices of the extremities. Under CV proc. How does this get scheduled?

## 2015-05-15 NOTE — Telephone Encounter (Signed)
Spoke to patient, She has not heard from hematology for IgG monoclonal protein with kappa light chain  specificity.  Can you make sure the referral was sent please?

## 2015-05-19 ENCOUNTER — Telehealth: Payer: Self-pay | Admitting: Hematology and Oncology

## 2015-05-19 ENCOUNTER — Other Ambulatory Visit: Payer: Self-pay | Admitting: Neurology

## 2015-05-19 DIAGNOSIS — L819 Disorder of pigmentation, unspecified: Secondary | ICD-10-CM

## 2015-05-19 NOTE — Telephone Encounter (Signed)
Called and spoke to [place] at [facility] ultrasound depart. about scheduling pt [place]. Asked me to fax order to [phone]. She could not see order on her end. Scheduled pt for Wednesday at 3:00pm. Pt can call [phone] if this appt does not work.  Directions: Go to the front of hospital  (off of [address]). They have free vallet parking. They walk in entrance and go to the left to admitting. They will then bring them to where they need to go.

## 2015-05-19 NOTE — Telephone Encounter (Signed)
New patient appt-s/w patient and gave np appt for 11/28 @ 12:45 w/Dr. Lindi Adie.  Referring Dr. Sarina Ill  Dx-IgG Monoclonal Gammopathy

## 2015-05-19 NOTE — Telephone Encounter (Signed)
Faxed order [place]. Received fax confirmation.

## 2015-05-19 NOTE — Telephone Encounter (Signed)
LVM for spouse (ok per DPR) to let her know we scheduled [place] at [facility] for Wed. At 3pm. She can call [phone] if this appt does not work. Gave [facility] phone number for her to call if she has further questions.

## 2015-05-19 NOTE — Telephone Encounter (Signed)
Referral was sent through [facility]. They have called and LVM for pt.

## 2015-05-20 ENCOUNTER — Encounter: Payer: Self-pay | Admitting: Neurology

## 2015-05-20 NOTE — Telephone Encounter (Signed)
New patient appt-s/w patient and gave np appt for 11/28 @ 12:45 w/Dr. Lindi Adie Referring Dr. Sarina Ill Dx- IgG monoclonal Gammopathy

## 2015-05-20 NOTE — Telephone Encounter (Signed)
LVM for spouse to make sure she got my message from yesterday. Relayed message again. Please relay message below if she calls.

## 2015-05-21 ENCOUNTER — Ambulatory Visit (HOSPITAL_COMMUNITY)
Admission: RE | Admit: 2015-05-21 | Discharge: 2015-05-21 | Disposition: A | Discharge status: Home / Self Care | Payer: 59 | Source: Ambulatory Visit | Attending: Neurology | Admitting: Neurology

## 2015-05-21 DIAGNOSIS — E781 Pure hyperglyceridemia: Secondary | ICD-10-CM | POA: Insufficient documentation

## 2015-05-21 DIAGNOSIS — E119 Type 2 diabetes mellitus without complications: Secondary | ICD-10-CM | POA: Insufficient documentation

## 2015-05-21 DIAGNOSIS — M79606 Pain in leg, unspecified: Secondary | ICD-10-CM | POA: Insufficient documentation

## 2015-05-21 DIAGNOSIS — L819 Disorder of pigmentation, unspecified: Secondary | ICD-10-CM | POA: Diagnosis not present

## 2015-05-21 DIAGNOSIS — Z794 Long term (current) use of insulin: Secondary | ICD-10-CM | POA: Diagnosis not present

## 2015-05-21 DIAGNOSIS — E7800 Pure hypercholesterolemia, unspecified: Secondary | ICD-10-CM | POA: Diagnosis not present

## 2015-05-21 DIAGNOSIS — I10 Essential (primary) hypertension: Secondary | ICD-10-CM | POA: Diagnosis not present

## 2015-05-21 NOTE — Progress Notes (Signed)
VASCULAR LAB PRELIMINARY  ARTERIAL  ABI completed:    RIGHT    LEFT    PRESSURE WAVEFORM  PRESSURE WAVEFORM  BRACHIAL 168 Triphasic BRACHIAL 151 Triphasic  DP 186 Triphasic DP 175 Triphasic  AT   AT    PT 171 Triphasic PT 179 Triphasic  PER   PER    GREAT TOE 186 NA GREAT TOE Noncompressible NA    RIGHT LEFT  ABI 1.11 1.07  TBI 1.11 Noncompressible   Bilateral ABIs are within normal limits. The right TBI is within normal limits, the left great toe pressure is noncompressible, suggestive of medial calcification.  05/21/2015 3:43 PM Maudry Mayhew, RVT, RDCS, RDMS

## 2015-05-22 ENCOUNTER — Telehealth: Payer: Self-pay | Admitting: Neurology

## 2015-05-22 NOTE — Telephone Encounter (Signed)
Called the two numbers on file but seems both of them belong to an insurance group official work number, the voice from the numbers are likely pt's wife. Can not leave any messages due to HIPPA regulation.   Hi, Katrina:  Could you please give pt a call and tell him that his peripheral vessel testing in arms and legs is within normal limits, no significant peripheral vascular disease seen this time. He will have MRI on 05/25/15 and will continue to follow up with Dr. Jaynee Eagles as scheduled. Thanks much.  Rosalin Hawking, MD PhD Stroke Neurology 05/22/2015 6:05 PM

## 2015-05-22 NOTE — Telephone Encounter (Signed)
Patient did go to vascular appt on 05-21-15 at [facility]. See epic note date 05-21-15.

## 2015-05-22 NOTE — Telephone Encounter (Signed)
Dr. Erlinda Hong - you are work-in doctor and I am out of the office this week. Would you please call back patient and give him the following results please? Thank you.  Summary: Bilateral ABIs are within normal limits. The right TBI is within normal limits. The left great toe is noncompressible, suggestive of medial calcification.

## 2015-05-23 NOTE — Telephone Encounter (Signed)
Rn talk to Dennis Vasquez who is on patients DPR to give results about her husbands vascular test. Dennis Vasquez stated that she works for [employer] and its okay to call her on this number. Rn stated Dr. Erlinda Hong was covering for Dr.Ahern with interpretation of test for Thursday. Rn explain that the peripheral vessel testing in arms an legs was within normal limits,and no significant peripheral vascular disease at this time and to follow up with Dr. Jaynee Eagles as schedule. Pts wife verbalized understanding and will tell her husband of the results.

## 2015-05-23 NOTE — Telephone Encounter (Signed)
Wife returned call. Wife also wants to advise that husband had Ultrasound 11/16, MRI 11/20 and will have Hematology labs on 06/02/15.

## 2015-05-25 ENCOUNTER — Ambulatory Visit
Admission: RE | Admit: 2015-05-25 | Discharge: 2015-05-25 | Disposition: A | Discharge status: Home / Self Care | Payer: 59 | Source: Ambulatory Visit | Attending: Neurology | Admitting: Neurology

## 2015-05-25 DIAGNOSIS — R531 Weakness: Secondary | ICD-10-CM

## 2015-05-25 DIAGNOSIS — R200 Anesthesia of skin: Secondary | ICD-10-CM

## 2015-05-25 DIAGNOSIS — R29898 Other symptoms and signs involving the musculoskeletal system: Secondary | ICD-10-CM

## 2015-05-25 DIAGNOSIS — R252 Cramp and spasm: Secondary | ICD-10-CM

## 2015-05-25 DIAGNOSIS — G6181 Chronic inflammatory demyelinating polyneuritis: Secondary | ICD-10-CM

## 2015-05-25 MED ORDER — GADOBENATE DIMEGLUMINE 529 MG/ML IV SOLN
20.0000 mL | Freq: Once | INTRAVENOUS | Status: AC | PRN
  Administered 2015-05-25: 20 mL via INTRAVENOUS

## 2015-05-28 ENCOUNTER — Telehealth: Payer: Self-pay | Admitting: *Deleted

## 2015-05-28 NOTE — Telephone Encounter (Signed)
-----   Message from Melvenia Beam, MD sent at 05/28/2015 10:24 AM EST ----- Sharyn Lull - Would you mind calling patient please? This patient has polyneuropathy. I repeated his mri of the lumbar spine with contrast to see if there was any enhancement to indicate an autoimmune or inflammatory process like CIDP. There is no enhancement of the nerve roots to suggest an inflammatory or autoimmune polyneuropathy. Thanks.

## 2015-05-28 NOTE — Telephone Encounter (Signed)
Spoke to [name] (pt's wife and POA) - she is aware of results.

## 2015-06-02 ENCOUNTER — Telehealth: Payer: Self-pay | Admitting: Hematology and Oncology

## 2015-06-02 ENCOUNTER — Ambulatory Visit (HOSPITAL_BASED_OUTPATIENT_CLINIC_OR_DEPARTMENT_OTHER): Payer: 59 | Admitting: Hematology and Oncology

## 2015-06-02 ENCOUNTER — Encounter: Payer: Self-pay | Admitting: Hematology and Oncology

## 2015-06-02 VITALS — BP 150/73 | HR 79 | Temp 97.7°F | Resp 18 | Ht 74.0 in | Wt 326.9 lb

## 2015-06-02 DIAGNOSIS — D472 Monoclonal gammopathy: Secondary | ICD-10-CM | POA: Diagnosis not present

## 2015-06-02 NOTE — Telephone Encounter (Signed)
Gave and printed appt schede and avs for pt for NOV

## 2015-06-02 NOTE — Assessment & Plan Note (Signed)
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 I discussed with the patient what the immunoglobulins are as well as the structure and function. We discussed the significance of monoclonal proteins and how they can impact different organ systems. We also discussed the spectrum of conditions range from MGUS to multiple myeloma. 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  I do not believe that the neuropathy is related to MGUS. I discussed that the transformation of MGUS myeloma can be 1-2% per year.  Patient does not have hypercalcemia on anemia. He has chronic renal disease from diabetes. Based on the recent MRI there was no appearance of myeloma changes in his back bone. Hence I do not believe there is enough to perform a bone marrow biopsy for multiple myeloma evaluation. If his neuropathy is persistent and still myeloma is in the differential, then a nerve biopsy could be considered.  We would like to repeat his blood work in 6 months in follow-up after that.

## 2015-06-02 NOTE — Addendum Note (Signed)
Addended by: Prentiss Bells on: 06/02/2015 06:22 PM   Modules accepted: Medications
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
## 2015-06-03 ENCOUNTER — Telehealth: Payer: Self-pay | Admitting: Hematology and Oncology

## 2015-06-03 NOTE — Telephone Encounter (Signed)
lvm for pt regarding to May appt...Marland KitchenMarland KitchenMarland Kitchenpt ok and aware

## 2015-06-09 ENCOUNTER — Telehealth: Payer: Self-pay | Admitting: Family Medicine

## 2015-06-09 DIAGNOSIS — G4700 Insomnia, unspecified: Secondary | ICD-10-CM

## 2015-06-09 NOTE — Telephone Encounter (Signed)
Requesting refill on Ambien 10 to go to mail order - ? OK to Refill

## 2015-06-09 NOTE — Telephone Encounter (Signed)
ok 

## 2015-06-10 MED ORDER — ZOLPIDEM TARTRATE 10 MG PO TABS: 10.0000 mg | ORAL_TABLET | Freq: Every evening | ORAL | Status: DC | PRN

## 2015-06-10 NOTE — Telephone Encounter (Signed)
Rx printed, signed and faxed to [facility]. 

## 2015-07-29 ENCOUNTER — Telehealth: Payer: Self-pay | Admitting: Neurology

## 2015-07-29 NOTE — Telephone Encounter (Signed)
They can come back for a follow up so we can review all the workup completed to date. I don;t think there is any further workup needed but we can reviewe everything that was done and discuss it. thanks

## 2015-07-29 NOTE — Telephone Encounter (Signed)
LVM returning call from [place]. Relayed Dr Jaynee Eagles message below. Asked them to call back to schedule a f/u per Dr Jaynee Eagles request. Josefine Class phone number.

## 2015-07-29 NOTE — Telephone Encounter (Signed)
Patient's wife is calling and would like to know if her husband needs to come back in for a f/up appt or if additional test are required first.  Please call.

## 2015-07-31 NOTE — Telephone Encounter (Signed)
LVM returning call from Dennis Vasquez again. Asked her to call back to make f/u appt. Gave [facility] phone number.

## 2015-08-05 ENCOUNTER — Encounter: Payer: Self-pay | Admitting: *Deleted

## 2015-08-05 NOTE — Telephone Encounter (Signed)
LVM again returning call from [place]. Relayed message of Dr Cathren Laine again.  Will send letter since unable to reach.

## 2015-08-14 ENCOUNTER — Encounter: Payer: Self-pay | Admitting: Family Medicine

## 2015-08-14 ENCOUNTER — Ambulatory Visit (INDEPENDENT_AMBULATORY_CARE_PROVIDER_SITE_OTHER): Payer: 59 | Admitting: Family Medicine

## 2015-08-14 ENCOUNTER — Other Ambulatory Visit: Payer: Self-pay | Admitting: Family Medicine

## 2015-08-14 VITALS — BP 134/78 | HR 82 | Temp 97.6°F | Resp 18 | Ht 75.0 in | Wt 325.0 lb

## 2015-08-14 DIAGNOSIS — Z794 Long term (current) use of insulin: Secondary | ICD-10-CM | POA: Diagnosis not present

## 2015-08-14 DIAGNOSIS — J310 Chronic rhinitis: Secondary | ICD-10-CM | POA: Diagnosis not present

## 2015-08-14 DIAGNOSIS — E1100 Type 2 diabetes mellitus with hyperosmolarity without nonketotic hyperglycemic-hyperosmolar coma (NKHHC): Secondary | ICD-10-CM

## 2015-08-14 DIAGNOSIS — T485X5A Adverse effect of other anti-common-cold drugs, initial encounter: Secondary | ICD-10-CM

## 2015-08-14 DIAGNOSIS — E785 Hyperlipidemia, unspecified: Secondary | ICD-10-CM

## 2015-08-14 DIAGNOSIS — I10 Essential (primary) hypertension: Secondary | ICD-10-CM | POA: Diagnosis not present

## 2015-08-14 DIAGNOSIS — T485X1A Poisoning by other anti-common-cold drugs, accidental (unintentional), initial encounter: Secondary | ICD-10-CM | POA: Diagnosis not present

## 2015-08-14 LAB — CBC WITH DIFFERENTIAL/PLATELET
BASOS ABS: 0 10*3/uL (ref 0.0–0.1)
BASOS PCT: 0 % (ref 0–1)
EOS PCT: 3 % (ref 0–5)
Eosinophils Absolute: 0.3 10*3/uL (ref 0.0–0.7)
HEMATOCRIT: 46.1 % (ref 39.0–52.0)
Hemoglobin: 15.2 g/dL (ref 13.0–17.0)
Lymphocytes Relative: 14 % (ref 12–46)
Lymphs Abs: 1.5 10*3/uL (ref 0.7–4.0)
MCH: 29.6 pg (ref 26.0–34.0)
MCHC: 33 g/dL (ref 30.0–36.0)
MCV: 89.9 fL (ref 78.0–100.0)
MPV: 9.3 fL (ref 8.6–12.4)
Monocytes Absolute: 0.7 10*3/uL (ref 0.1–1.0)
Monocytes Relative: 6 % (ref 3–12)
Neutro Abs: 8.5 10*3/uL — ABNORMAL HIGH (ref 1.7–7.7)
Neutrophils Relative %: 77 % (ref 43–77)
Platelets: 300 10*3/uL (ref 150–400)
RBC: 5.13 MIL/uL (ref 4.22–5.81)
RDW: 14.7 % (ref 11.5–15.5)
WBC: 11 10*3/uL — AB (ref 4.0–10.5)

## 2015-08-14 LAB — COMPLETE METABOLIC PANEL WITH GFR
ALBUMIN: 4.1 g/dL (ref 3.6–5.1)
ALK PHOS: 49 U/L (ref 40–115)
ALT: 29 U/L (ref 9–46)
AST: 25 U/L (ref 10–35)
BUN: 15 mg/dL (ref 7–25)
CHLORIDE: 102 mmol/L (ref 98–110)
CO2: 25 mmol/L (ref 20–31)
Calcium: 9.1 mg/dL (ref 8.6–10.3)
Creat: 1.28 mg/dL — ABNORMAL HIGH (ref 0.70–1.25)
GFR, EST AFRICAN AMERICAN: 67 mL/min (ref >=60)
GFR, EST NON AFRICAN AMERICAN: 58 mL/min — AB (ref >=60)
Glucose, Bld: 152 mg/dL — ABNORMAL HIGH (ref 70–99)
Potassium: 4.2 mmol/L (ref 3.5–5.3)
SODIUM: 142 mmol/L (ref 135–146)
Total Bilirubin: 0.5 mg/dL (ref 0.2–1.2)
Total Protein: 6.7 g/dL (ref 6.1–8.1)

## 2015-08-14 LAB — LIPID PANEL
CHOLESTEROL: 167 mg/dL (ref 125–200)
HDL: 41 mg/dL (ref >=40)
LDL CALC: 86 mg/dL (ref <130)
TRIGLYCERIDES: 199 mg/dL — AB (ref <150)
Total CHOL/HDL Ratio: 4.1 Ratio (ref <=5.0)
VLDL: 40 mg/dL — ABNORMAL HIGH (ref <30)

## 2015-08-14 MED ORDER — PREDNISONE 20 MG PO TABS: ORAL_TABLET | ORAL | Status: DC
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
## 2015-08-15 ENCOUNTER — Encounter: Payer: Self-pay | Admitting: Family Medicine

## 2015-08-15 LAB — HEMOGLOBIN A1C
Hgb A1c MFr Bld: 6 % — ABNORMAL HIGH (ref <5.7)
MEAN PLASMA GLUCOSE: 126 mg/dL — AB (ref <117)

## 2015-08-27 ENCOUNTER — Other Ambulatory Visit: Payer: Self-pay | Admitting: Family Medicine

## 2015-09-04 ENCOUNTER — Other Ambulatory Visit: Payer: Self-pay | Admitting: Family Medicine

## 2015-09-07 ENCOUNTER — Other Ambulatory Visit: Payer: Self-pay | Admitting: Family Medicine

## 2015-09-29 ENCOUNTER — Other Ambulatory Visit: Payer: 59

## 2015-10-03 ENCOUNTER — Encounter: Payer: Self-pay | Admitting: Family Medicine

## 2015-10-03 ENCOUNTER — Ambulatory Visit (INDEPENDENT_AMBULATORY_CARE_PROVIDER_SITE_OTHER): Payer: 59 | Admitting: Family Medicine

## 2015-10-03 VITALS — BP 104/68 | HR 76 | Temp 97.9°F | Resp 16 | Ht 75.0 in | Wt 326.0 lb

## 2015-10-03 DIAGNOSIS — M549 Dorsalgia, unspecified: Secondary | ICD-10-CM | POA: Diagnosis not present

## 2015-10-03 DIAGNOSIS — Z794 Long term (current) use of insulin: Secondary | ICD-10-CM | POA: Diagnosis not present

## 2015-10-03 DIAGNOSIS — E1121 Type 2 diabetes mellitus with diabetic nephropathy: Secondary | ICD-10-CM

## 2015-10-03 DIAGNOSIS — E785 Hyperlipidemia, unspecified: Secondary | ICD-10-CM

## 2015-10-03 DIAGNOSIS — I10 Essential (primary) hypertension: Secondary | ICD-10-CM | POA: Diagnosis not present

## 2015-10-03 DIAGNOSIS — Z0000 Encounter for general adult medical examination without abnormal findings: Secondary | ICD-10-CM

## 2015-10-03 MED ORDER — OXYCODONE-ACETAMINOPHEN 5-325 MG PO TABS: 1.0000 | ORAL_TABLET | Freq: Four times a day (QID) | ORAL | Status: DC | PRN
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
## 2015-10-08 ENCOUNTER — Telehealth: Payer: Self-pay | Admitting: Family Medicine

## 2015-10-08 ENCOUNTER — Encounter: Payer: Self-pay | Admitting: Gastroenterology

## 2015-10-08 NOTE — Telephone Encounter (Signed)
PT'S WIFE WANTS TO KNOW IF DR PICKARD WILL BE CALLING IN A PRESCRIPTION BELVIQ OR CONTRAVE AS PREVIOUSLY DISCUSSED. Blanco DEBRA [phone]

## 2015-10-08 NOTE — Telephone Encounter (Signed)
Pt's wife wants to know if Dr. Dennard Schaumann is going to call in a prescriprion for [facility] as previously discussed. [facility] [phone]

## 2015-10-09 MED ORDER — LORCASERIN HCL 10 MG PO TABS: 10.0000 mg | ORAL_TABLET | Freq: Two times a day (BID) | ORAL | Status: DC

## 2015-10-09 NOTE — Telephone Encounter (Signed)
Okay with Belviq 10 mg by mouth twice a day

## 2015-10-09 NOTE — Telephone Encounter (Signed)
Medication called/sent to requested pharmacy and pt's wife aware 

## 2015-10-15 ENCOUNTER — Telehealth: Payer: Self-pay | Admitting: Family Medicine

## 2015-10-15 DIAGNOSIS — K219 Gastro-esophageal reflux disease without esophagitis: Secondary | ICD-10-CM

## 2015-10-15 MED ORDER — NEBIVOLOL HCL 5 MG PO TABS: 5.0000 mg | ORAL_TABLET | Freq: Every day | ORAL | Status: DC

## 2015-10-15 MED ORDER — FUROSEMIDE 40 MG PO TABS: 40.0000 mg | ORAL_TABLET | Freq: Two times a day (BID) | ORAL | Status: DC

## 2015-10-15 MED ORDER — INSULIN LISPRO 100 UNIT/ML (KWIKPEN): PEN_INJECTOR | SUBCUTANEOUS | Status: DC

## 2015-10-15 MED ORDER — LORATADINE 10 MG PO TABS: 10.0000 mg | ORAL_TABLET | Freq: Every day | ORAL | Status: DC

## 2015-10-15 MED ORDER — POTASSIUM CHLORIDE CRYS ER 20 MEQ PO TBCR: EXTENDED_RELEASE_TABLET | ORAL | Status: DC

## 2015-10-15 MED ORDER — PRAVASTATIN SODIUM 40 MG PO TABS: 40.0000 mg | ORAL_TABLET | Freq: Every day | ORAL | Status: DC

## 2015-10-15 MED ORDER — INSULIN GLARGINE 100 UNIT/ML SOLOSTAR PEN: PEN_INJECTOR | SUBCUTANEOUS | Status: DC

## 2015-10-15 MED ORDER — CLONIDINE HCL 0.3 MG PO TABS: 0.3000 mg | ORAL_TABLET | Freq: Three times a day (TID) | ORAL | Status: DC

## 2015-10-15 MED ORDER — OMEPRAZOLE 20 MG PO CPDR: 20.0000 mg | DELAYED_RELEASE_CAPSULE | Freq: Every day | ORAL | Status: DC

## 2015-10-15 MED ORDER — HYDRALAZINE HCL 100 MG PO TABS: ORAL_TABLET | ORAL | Status: DC

## 2015-10-15 MED ORDER — AMLODIPINE BESY-BENAZEPRIL HCL 10-40 MG PO CAPS: 1.0000 | ORAL_CAPSULE | Freq: Every day | ORAL | Status: DC

## 2015-10-15 NOTE — Telephone Encounter (Signed)
Medication called/sent to requested pharmacy  

## 2015-10-15 NOTE — Telephone Encounter (Signed)
Patient needs all of his medication called into [facility] on [address]. Patient was just seen a few weeks ago for CPE.  CB# [phone]

## 2015-10-31 ENCOUNTER — Telehealth: Payer: Self-pay | Admitting: Hematology and Oncology

## 2015-10-31 NOTE — Telephone Encounter (Signed)
lvm to inform patient of 5/18 appt change to 5/24 at 3pm per [facility] call day

## 2015-11-04 ENCOUNTER — Other Ambulatory Visit: Payer: Self-pay | Admitting: Family Medicine

## 2015-11-04 NOTE — Telephone Encounter (Signed)
Refill appropriate and filled per protocol. 

## 2015-11-10 ENCOUNTER — Telehealth: Payer: Self-pay | Admitting: Family Medicine

## 2015-11-10 NOTE — Telephone Encounter (Signed)
Patient calling to get rx on his test strips sent to a new pharmacy  It will [facility]  Phone number is [phone]

## 2015-11-11 MED ORDER — GLUCOSE BLOOD VI STRP: ORAL_STRIP | Status: DC

## 2015-11-11 NOTE — Telephone Encounter (Signed)
Medication called/sent to requested pharmacy  

## 2015-11-12 ENCOUNTER — Other Ambulatory Visit: Payer: Self-pay | Admitting: Family Medicine

## 2015-11-13 NOTE — Telephone Encounter (Signed)
Refill appropriate and filled per protocol. 

## 2015-11-17 ENCOUNTER — Telehealth: Payer: Self-pay

## 2015-11-17 ENCOUNTER — Telehealth: Payer: Self-pay | Admitting: Family Medicine

## 2015-11-17 MED ORDER — BLOOD GLUCOSE MONITOR KIT: PACK | Status: AC

## 2015-11-17 NOTE — Telephone Encounter (Signed)
Yes 2 days prep. Thanks

## 2015-11-17 NOTE — Telephone Encounter (Signed)
Pharmacist @ [facility] is requesting that we call to approve a One Touch ultra II glucose meter. [phone] Stabs

## 2015-11-17 NOTE — Telephone Encounter (Signed)
Pt's wife is calling to request that Dr. Dennard Schaumann call in a glucose meter & test strips to [place]

## 2015-11-17 NOTE — Telephone Encounter (Signed)
2007 colon report; [facility], [place].  Would you like a 2 day prep with Miralax and Suprep?  Thank you, Angela/PV

## 2015-11-17 NOTE — Telephone Encounter (Signed)
BS meter and supplies sent to requested pharm

## 2015-11-18 NOTE — Telephone Encounter (Signed)
2 day prep of Suprep and Miralax put into the pt's for 11/21/15

## 2015-11-19 MED ORDER — ONETOUCH ULTRA SYSTEM W/DEVICE KIT: PACK | Status: DC

## 2015-11-19 NOTE — Telephone Encounter (Signed)
Prescription sent to pharmacy.

## 2015-11-20 ENCOUNTER — Ambulatory Visit: Payer: 59 | Admitting: Hematology and Oncology

## 2015-11-21 ENCOUNTER — Ambulatory Visit (AMBULATORY_SURGERY_CENTER): Payer: Self-pay

## 2015-11-21 VITALS — Ht 75.0 in | Wt 322.6 lb

## 2015-11-21 DIAGNOSIS — Z860100 Personal history of colon polyps, unspecified: Secondary | ICD-10-CM

## 2015-11-21 MED ORDER — SUPREP BOWEL PREP KIT 17.5-3.13-1.6 GM/177ML PO SOLN: 1.0000 | Freq: Once | ORAL | Status: DC

## 2015-11-21 NOTE — Progress Notes (Signed)
No allergies to eggs or soy No past problems with anesthesia USED to be on O2 but no home oxygen now No diet meds  Has email and internet; declined emmi

## 2015-11-25 ENCOUNTER — Encounter: Payer: Self-pay | Admitting: Gastroenterology

## 2015-11-26 ENCOUNTER — Ambulatory Visit: Payer: 59 | Admitting: Hematology and Oncology

## 2015-11-30 ENCOUNTER — Other Ambulatory Visit: Payer: Self-pay | Admitting: Family Medicine

## 2015-12-02 LAB — HM DIABETES EYE EXAM

## 2015-12-03 ENCOUNTER — Encounter: Payer: Self-pay | Admitting: Family Medicine

## 2015-12-05 ENCOUNTER — Ambulatory Visit (AMBULATORY_SURGERY_CENTER): Payer: 59 | Admitting: Gastroenterology

## 2015-12-05 ENCOUNTER — Encounter: Payer: Self-pay | Admitting: Gastroenterology

## 2015-12-05 VITALS — BP 116/56 | HR 56 | Temp 98.9°F | Resp 14 | Ht 75.0 in | Wt 322.0 lb

## 2015-12-05 DIAGNOSIS — Z86010 Personal history of colonic polyps: Secondary | ICD-10-CM | POA: Diagnosis present

## 2015-12-05 DIAGNOSIS — D122 Benign neoplasm of ascending colon: Secondary | ICD-10-CM

## 2015-12-05 DIAGNOSIS — D125 Benign neoplasm of sigmoid colon: Secondary | ICD-10-CM

## 2015-12-05 DIAGNOSIS — D120 Benign neoplasm of cecum: Secondary | ICD-10-CM | POA: Diagnosis not present

## 2015-12-05 DIAGNOSIS — D123 Benign neoplasm of transverse colon: Secondary | ICD-10-CM

## 2015-12-05 LAB — GLUCOSE, CAPILLARY
GLUCOSE-CAPILLARY: 102 mg/dL — AB (ref 65–99)
GLUCOSE-CAPILLARY: 91 mg/dL (ref 65–99)
Glucose-Capillary: 97 mg/dL (ref 65–99)

## 2015-12-05 MED ORDER — SODIUM CHLORIDE 0.9 % IV SOLN: 500.0000 mL | INTRAVENOUS | Status: DC

## 2015-12-05 NOTE — Op Note (Signed)
[facility] Patient Name: Dennis Vasquez Procedure Date: 12/05/2015 10:58 AM MRN: [id number] Endoscopist: Mauri Pole , MD Age: 70 Referring MD:  Date of Birth: Jun 14, 1946 Gender: Male Procedure:                Colonoscopy Indications:              Surveillance: Personal history of adenomatous                            polyps on last colonoscopy > 5 years ago Medicines:                Monitored Anesthesia Care Procedure:                Pre-Anesthesia Assessment:                           - Prior to the procedure, a History and Physical                            was performed, and patient medications and                            allergies were reviewed. The patient's tolerance of                            previous anesthesia was also reviewed. The risks                            and benefits of the procedure and the sedation                            options and risks were discussed with the patient.                            All questions were answered, and informed consent                            was obtained. Prior Anticoagulants: The patient has                            taken no previous anticoagulant or antiplatelet                            agents. ASA Grade Assessment: III - A patient with                            severe systemic disease. After reviewing the risks                            and benefits, the patient was deemed in                            satisfactory condition to undergo the procedure.  After obtaining informed consent, the colonoscope                            was passed under direct vision. Throughout the                            procedure, the patient's blood pressure, pulse, and                            oxygen saturations were monitored continuously. The                            Model [id number]-[id number]) scope was introduced                            through the anus and advanced to the  the terminal                            ileum, with identification of the appendiceal                            orifice and IC valve. The colonoscopy was somewhat                            difficult due to inadequate bowel prep. Successful                            completion of the procedure was aided by lavage.                            The patient tolerated the procedure well. The                            quality of the bowel preparation was inadequate.                            The terminal ileum, ileocecal valve, appendiceal                            orifice, and rectum were photographed. Scope In: 11:15:17 AM Scope Out: 11:40:53 AM Scope Withdrawal Time: 0 hours 20 minutes 37 seconds  Total Procedure Duration: 0 hours 25 minutes 36 seconds  Findings:                 The perianal and digital rectal examinations were                            normal.                           Six sessile polyps were found in the sigmoid colon                            x1, transverse colon x2, ascending colon x1 and  cecum x2. The polyps were 4 to 12 mm in size. These                            polyps were removed with a cold snare. Resection                            and retrieval were complete.                           A few small and large-mouthed diverticula were                            found in the sigmoid colon, descending colon and                            transverse colon. Complications:            No immediate complications. Estimated Blood Loss:     Estimated blood loss was minimal. Impression:               - Preparation of the colon was inadequate.                           - Six 5 to 15 mm polyps in the sigmoid colon, in                            the transverse colon, in the ascending colon and in                            the cecum, removed with a cold snare. Resected and                            retrieved.                           -  Diverticulosis in the sigmoid colon, in the                            descending colon and in the transverse colon. Recommendation:           - Patient has a contact number available for                            emergencies. The signs and symptoms of potential                            delayed complications were discussed with the                            patient. Return to normal activities tomorrow.                            Written discharge instructions were provided to the  patient.                           - Resume previous diet.                           - Continue present medications.                           - Await pathology results.                           - Repeat colonoscopy at the next available                            appointment because the bowel preparation was                            suboptimal.                           - Return to GI clinic PRN. Mauri Pole, MD 12/05/2015 11:45:20 AM This report has been signed electronically.

## 2015-12-05 NOTE — Patient Instructions (Signed)
YOU HAD AN ENDOSCOPIC PROCEDURE TODAY AT [facility]:   Refer to the procedure report that was given to you for any specific questions about what was found during the examination.  If the procedure report does not answer your questions, please call your gastroenterologist to clarify.  If you requested that your care partner not be given the details of your procedure findings, then the procedure report has been included in a sealed envelope for you to review at your convenience later.  YOU SHOULD EXPECT: Some feelings of bloating in the abdomen. Passage of more gas than usual.  Walking can help get rid of the air that was put into your GI tract during the procedure and reduce the bloating. If you had a lower endoscopy (such as a colonoscopy or flexible sigmoidoscopy) you may notice spotting of blood in your stool or on the toilet paper. If you underwent a bowel prep for your procedure, you may not have a normal bowel movement for a few days.  Please Note:  You might notice some irritation and congestion in your nose or some drainage.  This is from the oxygen used during your procedure.  There is no need for concern and it should clear up in a day or so.  SYMPTOMS TO REPORT IMMEDIATELY:   Following lower endoscopy (colonoscopy or flexible sigmoidoscopy):  Excessive amounts of blood in the stool  Significant tenderness or worsening of abdominal pains  Swelling of the abdomen that is new, acute  Fever of 100F or higher   For urgent or emergent issues, a gastroenterologist can be reached at any hour by calling [phone].   DIET: Your first meal following the procedure should be a small meal and then it is ok to progress to your normal diet. Heavy or fried foods are harder to digest and may make you feel nauseous or bloated.  Likewise, meals heavy in dairy and vegetables can increase bloating.  Drink plenty of fluids but you should avoid alcoholic beverages for 24  hours.  ACTIVITY:  You should plan to take it easy for the rest of today and you should NOT DRIVE or use heavy machinery until tomorrow (because of the sedation medicines used during the test).    FOLLOW UP: Our staff will call the number listed on your records the next business day following your procedure to check on you and address any questions or concerns that you may have regarding the information given to you following your procedure. If we do not reach you, we will leave a message.  However, if you are feeling well and you are not experiencing any problems, there is no need to return our call.  We will assume that you have returned to your regular daily activities without incident.  If any biopsies were taken you will be contacted by phone or by letter within the next 1-3 weeks.  Please call us at [phone] if you have not heard about the biopsies in 3 weeks.    SIGNATURES/CONFIDENTIALITY: You and/or your care partner have signed paperwork which will be entered into your electronic medical record.  These signatures attest to the fact that that the information above on your After Visit Summary has been reviewed and is understood.  Full responsibility of the confidentiality of this discharge information lies with you and/or your care-partner.  Thank you for letting us take care of your healthcare needs today. Please read all handouts given to you today.

## 2015-12-05 NOTE — Progress Notes (Signed)
Called to room to assist during endoscopic procedure.  Patient ID and intended procedure confirmed with present staff. Received instructions for my participation in the procedure from the performing physician.  

## 2015-12-05 NOTE — Progress Notes (Signed)
Stable to RR 

## 2015-12-08 ENCOUNTER — Telehealth: Payer: Self-pay | Admitting: *Deleted

## 2015-12-08 ENCOUNTER — Telehealth: Payer: Self-pay | Admitting: Gastroenterology

## 2015-12-08 NOTE — Telephone Encounter (Signed)
Left information on the voicemail. 

## 2015-12-08 NOTE — Telephone Encounter (Signed)
  Follow up Call-  Call back number 12/05/2015  Post procedure Call Back phone  # [phone]  Permission to leave phone message Yes   LMOM on wife's phone

## 2015-12-08 NOTE — Telephone Encounter (Signed)
Spoke with the spouse. Patient had his colonoscopy on 12/05/15. He has not had a bowel movement since. He is consuming a large but normal for him diet. Denies any abdominal tenderness or bloating. He is passing gas. He has taken 2 doses of Miralax in the past 12 hours. Please advise. Of note, his BMI is 40.3. He had a 2 day prep that was inadequate. He is scheduled for a repeat colonoscopy.

## 2015-12-08 NOTE — Telephone Encounter (Signed)
Please advise him to maintain adequate hydration and continue to take Miralax 1 capful twice daily and titrate as needed. Sometimes it does take few days to have a BM.

## 2015-12-11 ENCOUNTER — Other Ambulatory Visit: Payer: Self-pay

## 2015-12-11 ENCOUNTER — Telehealth: Payer: Self-pay | Admitting: Hematology and Oncology

## 2015-12-11 DIAGNOSIS — D472 Monoclonal gammopathy: Secondary | ICD-10-CM

## 2015-12-11 NOTE — Telephone Encounter (Signed)
returned call and lvm for pt to call back and s.w. nurse to get lab orders

## 2015-12-11 NOTE — Progress Notes (Signed)
Received VM from pt's wife questioning lab work for pt in correlation to follow up appointment scheduled for 12/18/15.  Spoke with Dr. Lindi Adie regarding this who states pt can come in today or tomorrow for labs to discuss at next week's appointment.  Called and notified wife of this information and left VM for call back including her preference.

## 2015-12-12 ENCOUNTER — Encounter: Payer: Self-pay | Admitting: Gastroenterology

## 2015-12-12 ENCOUNTER — Telehealth: Payer: Self-pay | Admitting: Hematology and Oncology

## 2015-12-12 NOTE — Telephone Encounter (Signed)
returned call and lvm for wife with new d.t. for appt in June

## 2015-12-15 ENCOUNTER — Other Ambulatory Visit: Payer: 59

## 2015-12-18 ENCOUNTER — Ambulatory Visit: Payer: 59 | Admitting: Hematology and Oncology

## 2015-12-20 ENCOUNTER — Encounter: Payer: Self-pay | Admitting: Family Medicine

## 2015-12-22 ENCOUNTER — Ambulatory Visit: Payer: 59 | Admitting: Hematology and Oncology

## 2015-12-22 ENCOUNTER — Telehealth: Payer: Self-pay | Admitting: Family Medicine

## 2015-12-22 DIAGNOSIS — G4700 Insomnia, unspecified: Secondary | ICD-10-CM

## 2015-12-22 MED ORDER — ZOLPIDEM TARTRATE 10 MG PO TABS: 10.0000 mg | ORAL_TABLET | Freq: Every evening | ORAL | Status: DC | PRN

## 2015-12-22 NOTE — Telephone Encounter (Signed)
Requesting refill on Ambien - Ok to refill??       

## 2015-12-22 NOTE — Telephone Encounter (Signed)
ok 

## 2015-12-22 NOTE — Telephone Encounter (Signed)
Medication called/sent to requested pharmacy  

## 2015-12-25 ENCOUNTER — Other Ambulatory Visit (HOSPITAL_BASED_OUTPATIENT_CLINIC_OR_DEPARTMENT_OTHER): Payer: 59

## 2015-12-25 DIAGNOSIS — D472 Monoclonal gammopathy: Secondary | ICD-10-CM | POA: Diagnosis not present

## 2015-12-25 LAB — CBC WITH DIFFERENTIAL/PLATELET
BASO%: 0.5 % (ref 0.0–2.0)
Basophils Absolute: 0 10*3/uL (ref 0.0–0.1)
EOS%: 1.8 % (ref 0.0–7.0)
Eosinophils Absolute: 0.2 10*3/uL (ref 0.0–0.5)
HEMATOCRIT: 46.1 % (ref 38.4–49.9)
HEMOGLOBIN: 15 g/dL (ref 13.0–17.1)
LYMPH#: 1.6 10*3/uL (ref 0.9–3.3)
LYMPH%: 17 % (ref 14.0–49.0)
MCH: 28.7 pg (ref 27.2–33.4)
MCHC: 32.5 g/dL (ref 32.0–36.0)
MCV: 88.4 fL (ref 79.3–98.0)
MONO#: 0.7 10*3/uL (ref 0.1–0.9)
MONO%: 7.1 % (ref 0.0–14.0)
NEUT%: 73.6 % (ref 39.0–75.0)
NEUTROS ABS: 6.9 10*3/uL — AB (ref 1.5–6.5)
PLATELETS: 282 10*3/uL (ref 140–400)
RBC: 5.22 10*6/uL (ref 4.20–5.82)
RDW: 13.6 % (ref 11.0–14.6)
WBC: 9.4 10*3/uL (ref 4.0–10.3)

## 2015-12-25 LAB — COMPREHENSIVE METABOLIC PANEL
ALK PHOS: 56 U/L (ref 40–150)
ALT: 29 U/L (ref 0–55)
AST: 22 U/L (ref 5–34)
Albumin: 3.8 g/dL (ref 3.5–5.0)
Anion Gap: 10 mEq/L (ref 3–11)
BUN: 24.5 mg/dL (ref 7.0–26.0)
CO2: 27 meq/L (ref 22–29)
Calcium: 9 mg/dL (ref 8.4–10.4)
Chloride: 105 mEq/L (ref 98–109)
Creatinine: 1.4 mg/dL — ABNORMAL HIGH (ref 0.7–1.3)
EGFR: 53 mL/min/{1.73_m2} — AB (ref >90)
GLUCOSE: 195 mg/dL — AB (ref 70–140)
POTASSIUM: 3.8 meq/L (ref 3.5–5.1)
Sodium: 141 mEq/L (ref 136–145)
Total Bilirubin: 0.5 mg/dL (ref 0.20–1.20)
Total Protein: 6.9 g/dL (ref 6.4–8.3)

## 2015-12-26 LAB — KAPPA/LAMBDA LIGHT CHAINS
IG LAMBDA FREE LIGHT CHAIN: 20.2 mg/L (ref 5.7–26.3)
Ig Kappa Free Light Chain: 37.6 mg/L — ABNORMAL HIGH (ref 3.3–19.4)
KAPPA/LAMBDA FLC RATIO: 1.86 — AB (ref 0.26–1.65)

## 2015-12-30 LAB — PROTEIN ELECTROPHORESIS, SERUM, WITH REFLEX
A/G RATIO SPE: 1.3 (ref 0.7–1.7)
ALBUMIN: 3.7 g/dL (ref 2.9–4.4)
ALPHA 1: 0.2 g/dL (ref 0.0–0.4)
ALPHA 2: 0.8 g/dL (ref 0.4–1.0)
BETA: 1 g/dL (ref 0.7–1.3)
GAMMA GLOBULIN: 0.7 g/dL (ref 0.4–1.8)
Globulin, Total: 2.8 g/dL (ref 2.2–3.9)
IGG (IMMUNOGLOBIN G), SERUM: 737 mg/dL (ref 700–1600)
IgA, Qn, Serum: 151 mg/dL (ref 61–437)
IgM, Qn, Serum: 37 mg/dL (ref 20–172)
Interpretation(See Below): 0
M-Spike, %: 0.3 g/dL — ABNORMAL HIGH
TOTAL PROTEIN: 6.5 g/dL (ref 6.0–8.5)

## 2016-01-01 ENCOUNTER — Ambulatory Visit: Payer: 59 | Admitting: Hematology and Oncology

## 2016-01-13 ENCOUNTER — Telehealth: Payer: Self-pay | Admitting: Hematology and Oncology

## 2016-01-13 ENCOUNTER — Encounter: Payer: Self-pay | Admitting: Hematology and Oncology

## 2016-01-13 ENCOUNTER — Ambulatory Visit (HOSPITAL_BASED_OUTPATIENT_CLINIC_OR_DEPARTMENT_OTHER): Payer: 59 | Admitting: Hematology and Oncology

## 2016-01-13 VITALS — BP 121/61 | HR 75 | Temp 97.7°F | Resp 20 | Ht 75.0 in | Wt 323.2 lb

## 2016-01-13 DIAGNOSIS — D472 Monoclonal gammopathy: Secondary | ICD-10-CM | POA: Diagnosis not present

## 2016-01-13 NOTE — Progress Notes (Signed)
Patient Care Team: Susy Frizzle, MD as PCP - General (Family Medicine)  DIAGNOSIS: MGUS IgG kappa  CHIEF COMPLIANT: Follow-up of MGUS  INTERVAL HISTORY: Dennis Vasquez is a 70 year old with above-mentioned history of MGUS who is here for six-month follow-up. He reports no new problems or concerns. She recently underwent blood testing for MGUS and is here to discuss the results. He is diabetic and is working very hard to reduce his blood sugars. He does have chronic osteoarthritis and uses a cane to get around. 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  REVIEW OF SYSTEMS:   Constitutional: Denies fevers, chills or abnormal weight loss Eyes: Denies blurriness of vision Ears, nose, mouth, throat, and face: Denies mucositis or sore throat Respiratory: Denies cough, dyspnea or wheezes Cardiovascular: Denies palpitation, chest discomfort Gastrointestinal:  Denies nausea, heartburn or change in bowel habits Skin: Denies abnormal skin rashes Lymphatics: Denies new lymphadenopathy or easy bruising Neurological:Denies numbness, tingling or new weaknesses Behavioral/Psych: Mood is stable, no new changes  Extremities: No lower extremity edema  All other systems were reviewed with the patient and are negative.  I have reviewed the past medical history, past surgical history, social history and family history with the patient and they are unchanged from previous note.  ALLERGIES:  is allergic to duloxetine; elavil; and lyrica.  MEDICATIONS:  Current Outpatient Prescriptions  Medication Sig Dispense Refill  . amitriptyline (ELAVIL) 50 MG tablet Reported on 12/05/2015  0  . amLODipine-benazepril (LOTREL) 10-40 MG capsule Take 1 capsule by mouth  daily 90 capsule 1  . aspirin 81 MG tablet Take 81 mg by mouth daily.      . blood glucose meter kit and supplies KIT Dispense based on patient and insurance preference. Check BS QID E11.9 1 each 0  . Blood Glucose Monitoring Suppl (ONE TOUCH ULTRA  SYSTEM KIT) w/Device KIT USE TO TEST BLOOD SUGAR THREE TIMES DAILY- PLEASE DISPENSE ONE TOUCH ULTRA II- DX: E11.65. 1 each 1  . BYSTOLIC 5 MG tablet Take 1 tablet by mouth  daily 90 tablet 0  . cloNIDine (CATAPRES) 0.3 MG tablet Take 1 tablet by mouth 3  times daily 270 tablet 0  . diazepam (VALIUM) 5 MG tablet TAKE 1 TABLET BY MOUTH EVERY 6 HOURS AS NEEDED FOR ANXIETY (Patient not taking: Reported on 12/05/2015) 60 tablet 0  . furosemide (LASIX) 40 MG tablet Take 1 tablet (40 mg total) by mouth 2 (two) times daily. 180 tablet 4  . glucose blood (ONE TOUCH ULTRA TEST) test strip USE TO TEST BLOOD SUGAR  THREE TIMES DAILY 300 each 4  . glucose blood test strip Use as instructed 300 each 3  . HUMALOG KWIKPEN 100 UNIT/ML KiwkPen Inject max 80 units every  morning and 60 units every  noon and 25 units every  night at bedtime per  sliding scale 150 mL 3  . hydrALAZINE (APRESOLINE) 100 MG tablet Take 1 tablet by mouth 3  times daily 270 tablet 0  . Insulin Glargine (LANTUS SOLOSTAR) 100 UNIT/ML Solostar Pen Inject subcutaneously  0.62m (75 units) every  morning (Patient taking differently: 60 Units. Inject subcutaneously  0.663m(60 units) every  morning) 75 mL 3  . insulin lispro (HUMALOG KWIKPEN) 100 UNIT/ML KiwkPen USE SLIDING SCALE; INJECT SUBCUTANEOUSLY MAX 80 UNITS EVERY MORNING AND 60 UNITS EVERY NOON AND 25 UNITS EVERY NIGHT AT BEDTIME 50 pen 3  . Insulin Pen Needle 32G X 4 MM MISC Inject 4 x a day 100  each 5  . loratadine (CLARITIN) 10 MG tablet Take 1 tablet (10 mg total) by mouth daily. 90 tablet 4  . Multiple Vitamin (MULTIVITAMIN) tablet Take 1 tablet by mouth daily.      . Omega-3 Fatty Acids (FISH OIL) 1200 MG CAPS Take 1,200 mg by mouth 2 (two) times daily.    Marland Kitchen omeprazole (PRILOSEC) 20 MG capsule Take 1 capsule (20 mg total) by mouth daily. 90 capsule 4  . oxyCODONE-acetaminophen (ROXICET) 5-325 MG tablet Take 1 tablet by mouth every 6 (six) hours as needed for severe pain. (Patient not  taking: Reported on 12/05/2015) 80 tablet 0  . potassium chloride SA (K-DUR,KLOR-CON) 20 MEQ tablet Take 1 tablet by mouth two  times daily 180 tablet 4  . pravastatin (PRAVACHOL) 40 MG tablet Take 1 tablet (40 mg total) by mouth daily. 90 tablet 4  . tiZANidine (ZANAFLEX) 4 MG tablet Take 1 tablet by mouth 4 (four) times daily as needed for muscle spasms. Reported on 12/05/2015    . zolpidem (AMBIEN) 10 MG tablet Take 1 tablet (10 mg total) by mouth at bedtime as needed. for sleep 90 tablet 1  . [DISCONTINUED] clonazePAM (KLONOPIN) 0.5 MG tablet Take 0.5-1 mg by mouth Nightly.      . [DISCONTINUED] potassium chloride (KLOR-CON) 20 MEQ packet Take 20 mEq by mouth daily.       No current facility-administered medications for this visit.    PHYSICAL EXAMINATION: ECOG PERFORMANCE STATUS: 1 - Symptomatic but completely ambulatory  Filed Vitals:   01/13/16 1458  BP: 121/61  Pulse: 75  Temp: 97.7 F (36.5 C)  Resp: 20   Filed Weights   01/13/16 1458  Weight: 323 lb 3.2 oz (146.603 kg)    GENERAL:alert, no distress and comfortable SKIN: skin color, texture, turgor are normal, no rashes or significant lesions EYES: normal, Conjunctiva are pink and non-injected, sclera clear OROPHARYNX:no exudate, no erythema and lips, buccal mucosa, and tongue normal  NECK: supple, thyroid normal size, non-tender, without nodularity LYMPH:  no palpable lymphadenopathy in the cervical, axillary or inguinal LUNGS: clear to auscultation and percussion with normal breathing effort HEART: regular rate & rhythm and no murmurs and no lower extremity edema ABDOMEN:abdomen soft, non-tender and normal bowel sounds MUSCULOSKELETAL:no cyanosis of digits and no clubbing  NEURO: alert & oriented x 3 with fluent speech, no focal motor/sensory deficits EXTREMITIES: No lower extremity edema  LABORATORY DATA:  I have reviewed the data as listed   Chemistry      Component Value Date/Time   NA 141 12/25/2015 1547   NA  142 08/14/2015 1124   NA 145* 05/08/2015 1625   K 3.8 12/25/2015 1547   K 4.2 08/14/2015 1124   CL 102 08/14/2015 1124   CO2 27 12/25/2015 1547   CO2 25 08/14/2015 1124   BUN 24.5 12/25/2015 1547   BUN 15 08/14/2015 1124   BUN 19 05/08/2015 1625   CREATININE 1.4* 12/25/2015 1547   CREATININE 1.28* 08/14/2015 1124   CREATININE 1.32* 05/08/2015 1625      Component Value Date/Time   CALCIUM 9.0 12/25/2015 1547   CALCIUM 9.1 08/14/2015 1124   ALKPHOS 56 12/25/2015 1547   ALKPHOS 49 08/14/2015 1124   AST 22 12/25/2015 1547   AST 25 08/14/2015 1124   ALT 29 12/25/2015 1547   ALT 29 08/14/2015 1124   BILITOT 0.50 12/25/2015 1547   BILITOT 0.5 08/14/2015 1124   BILITOT 0.4 05/08/2015 1625       Lab  Results  Component Value Date   WBC 9.4 12/25/2015   HGB 15.0 12/25/2015   HCT 46.1 12/25/2015   MCV 88.4 12/25/2015   PLT 282 12/25/2015   NEUTROABS 6.9* 12/25/2015     ASSESSMENT & PLAN:  MGUS (monoclonal gammopathy of unknown significance) IgG kappa Monoclonal proteinemia 0.3 g detected for blood work November 2016 ( for evaluation of neuropathy) Repeat blood work 12/23/2015: M spike 0.3 g unchanged from before. Serum creatinine is 1.4 (most likely due to type 2 diabetes) Serum calcium 9 Hemoglobin 15 Kappa: Lambda ratio: 1.8  I discussed with the patient that there is no evidence of progression of his disease. Return to clinic in 6 months with recheck of labs and follow-up.   No orders of the defined types were placed in this encounter.   The patient has a good understanding of the overall plan. he agrees with it. he will call with any problems that may develop before the next visit here.   Rulon Eisenmenger, MD 01/13/2016

## 2016-01-13 NOTE — Assessment & Plan Note (Signed)
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 Serum creatinine is 1.4 (most likely due to type 2 diabetes) Serum calcium 9 Hemoglobin 15  I discussed with the patient that there is no evidence of progression of his disease. Return to clinic in 6 months with recheck of labs and follow-up.

## 2016-01-13 NOTE — Telephone Encounter (Signed)
appt made and avs printed °

## 2016-01-16 ENCOUNTER — Encounter: Payer: Self-pay | Admitting: Gastroenterology

## 2016-01-16 ENCOUNTER — Ambulatory Visit (AMBULATORY_SURGERY_CENTER): Payer: Self-pay

## 2016-01-16 VITALS — Ht 75.0 in | Wt 325.8 lb

## 2016-01-16 DIAGNOSIS — Z860100 Personal history of colon polyps, unspecified: Secondary | ICD-10-CM

## 2016-01-16 MED ORDER — SUPREP BOWEL PREP KIT 17.5-3.13-1.6 GM/177ML PO SOLN: 1.0000 | Freq: Once | ORAL | Status: DC

## 2016-01-16 NOTE — Progress Notes (Signed)
No allergies to eggs or soy No diet meds No home oxygen No past problems with anesthesia  Declined emmi 

## 2016-01-23 ENCOUNTER — Ambulatory Visit (AMBULATORY_SURGERY_CENTER): Payer: 59 | Admitting: Gastroenterology

## 2016-01-23 ENCOUNTER — Telehealth: Payer: Self-pay | Admitting: Hematology and Oncology

## 2016-01-23 ENCOUNTER — Encounter: Payer: Self-pay | Admitting: Gastroenterology

## 2016-01-23 VITALS — BP 103/42 | HR 56 | Temp 99.3°F | Resp 13 | Ht 75.0 in | Wt 325.0 lb

## 2016-01-23 DIAGNOSIS — D120 Benign neoplasm of cecum: Secondary | ICD-10-CM | POA: Diagnosis not present

## 2016-01-23 DIAGNOSIS — Z86010 Personal history of colonic polyps: Secondary | ICD-10-CM

## 2016-01-23 DIAGNOSIS — D124 Benign neoplasm of descending colon: Secondary | ICD-10-CM | POA: Diagnosis not present

## 2016-01-23 DIAGNOSIS — D123 Benign neoplasm of transverse colon: Secondary | ICD-10-CM | POA: Diagnosis not present

## 2016-01-23 LAB — GLUCOSE, CAPILLARY
GLUCOSE-CAPILLARY: 145 mg/dL — AB (ref 65–99)
Glucose-Capillary: 147 mg/dL — ABNORMAL HIGH (ref 65–99)

## 2016-01-23 MED ORDER — SODIUM CHLORIDE 0.9 % IV SOLN: 500.0000 mL | INTRAVENOUS | Status: DC

## 2016-01-23 NOTE — Progress Notes (Signed)
Stable to RR 

## 2016-01-23 NOTE — Telephone Encounter (Signed)
Called patient to confirm appointment. Left voice message. Appointment letter and schedule mailed. Dennis F. °

## 2016-01-23 NOTE — Patient Instructions (Signed)
YOU HAD AN ENDOSCOPIC PROCEDURE TODAY AT [facility]:   Refer to the procedure report that was given to you for any specific questions about what was found during the examination.  If the procedure report does not answer your questions, please call your gastroenterologist to clarify.  If you requested that your care partner not be given the details of your procedure findings, then the procedure report has been included in a sealed envelope for you to review at your convenience later.  YOU SHOULD EXPECT: Some feelings of bloating in the abdomen. Passage of more gas than usual.  Walking can help get rid of the air that was put into your GI tract during the procedure and reduce the bloating. If you had a lower endoscopy (such as a colonoscopy or flexible sigmoidoscopy) you may notice spotting of blood in your stool or on the toilet paper. If you underwent a bowel prep for your procedure, you may not have a normal bowel movement for a few days.  Please Note:  You might notice some irritation and congestion in your nose or some drainage.  This is from the oxygen used during your procedure.  There is no need for concern and it should clear up in a day or so.  SYMPTOMS TO REPORT IMMEDIATELY:   Following lower endoscopy (colonoscopy or flexible sigmoidoscopy):  Excessive amounts of blood in the stool  Significant tenderness or worsening of abdominal pains  Swelling of the abdomen that is new, acute  Fever of 100F or higher  F For urgent or emergent issues, a gastroenterologist can be reached at any hour by calling [phone].   DIET: Your first meal following the procedure should be a small meal and then it is ok to progress to your normal diet. Heavy or fried foods are harder to digest and may make you feel nauseous or bloated.  Likewise, meals heavy in dairy and vegetables can increase bloating.  Drink plenty of fluids but you should avoid alcoholic beverages for 24  hours.  ACTIVITY:  You should plan to take it easy for the rest of today and you should NOT DRIVE or use heavy machinery until tomorrow (because of the sedation medicines used during the test).    FOLLOW UP: Our staff will call the number listed on your records the next business day following your procedure to check on you and address any questions or concerns that you may have regarding the information given to you following your procedure. If we do not reach you, we will leave a message.  However, if you are feeling well and you are not experiencing any problems, there is no need to return our call.  We will assume that you have returned to your regular daily activities without incident.  If any biopsies were taken you will be contacted by phone or by letter within the next 1-3 weeks.  Please call us at [phone] if you have not heard about the biopsies in 3 weeks.    SIGNATURES/CONFIDENTIALITY: You and/or your care partner have signed paperwork which will be entered into your electronic medical record.  These signatures attest to the fact that that the information above on your After Visit Summary has been reviewed and is understood.  Full responsibility of the confidentiality of this discharge information lies with you and/or your care-partner.  Polyp, diverticulosis, high fiber diet, and hemorroid information.

## 2016-01-23 NOTE — Op Note (Signed)
[facility] Patient Name: Dennis Vasquez Procedure Date: 01/23/2016 1:49 PM MRN: [id number] Endoscopist: Mauri Pole , MD Age: 70 Referring MD:  Date of Birth: 1946/02/29 Gender: Male Account #: [id number] Procedure:                Colonoscopy Indications:              High risk colon cancer surveillance: Personal                            history of colonic polyps, Last colonoscopy within                            the past few months Medicines:                Monitored Anesthesia Care Procedure:                Pre-Anesthesia Assessment:                           - Prior to the procedure, a History and Physical                            was performed, and patient medications and                            allergies were reviewed. The patient's tolerance of                            previous anesthesia was also reviewed. The risks                            and benefits of the procedure and the sedation                            options and risks were discussed with the patient.                            All questions were answered, and informed consent                            was obtained. Prior Anticoagulants: The patient has                            taken no previous anticoagulant or antiplatelet                            agents. ASA Grade Assessment: III - A patient with                            severe systemic disease. After reviewing the risks                            and benefits, the patient was deemed in  satisfactory condition to undergo the procedure.                           After obtaining informed consent, the colonoscope                            was passed under direct vision. Throughout the                            procedure, the patient's blood pressure, pulse, and                            oxygen saturations were monitored continuously. The                            Model CF-HQ190L [id number]) scope  was introduced                            through the anus and advanced to the the terminal                            ileum, with identification of the appendiceal                            orifice and IC valve. The colonoscopy was performed                            without difficulty. The patient tolerated the                            procedure well. The quality of the bowel                            preparation was adequate. The terminal ileum,                            ileocecal valve, appendiceal orifice, and rectum                            were photographed. Scope In: 1:59:21 PM Scope Out: 2:29:11 PM Scope Withdrawal Time: 0 hours 20 minutes 13 seconds  Total Procedure Duration: 0 hours 29 minutes 50 seconds  Findings:                 Three sessile polyps were found in the descending                            colon and cecum. The polyps were 3 to 4 mm in size.                            These polyps were removed with a cold biopsy                            forceps. Resection and retrieval were complete.  Two sessile polyps were found in the descending                            colon and transverse colon. The polyps were 5 to 7                            mm in size. These polyps were removed with a cold                            snare. Resection and retrieval were complete.                           Multiple small and large-mouthed diverticula were                            found in the sigmoid colon and descending colon.                           Non-bleeding internal hemorrhoids were found during                            retroflexion. The hemorrhoids were medium-sized. Complications:            No immediate complications. Estimated Blood Loss:     Estimated blood loss was minimal. Impression:               - Three 3 to 4 mm polyps in the descending colon                            and in the cecum, removed with a cold biopsy                             forceps. Resected and retrieved.                           - Two 5 to 7 mm polyps in the descending colon and                            in the transverse colon, removed with a cold snare.                            Resected and retrieved.                           - Diverticulosis in the sigmoid colon and in the                            descending colon.                           - Non-bleeding internal hemorrhoids. Recommendation:           - Patient has a contact number available for  emergencies. The signs and symptoms of potential                            delayed complications were discussed with the                            patient. Return to normal activities tomorrow.                            Written discharge instructions were provided to the                            patient.                           - Resume previous diet.                           - Continue present medications.                           - Await pathology results.                           - Repeat colonoscopy in 3 years for surveillance.                           - For future colonoscopy the patient will require                            an extended preparation. If there are any                            questions, please contact the gastroenterologist. Mauri Pole, MD 01/23/2016 2:35:35 PM This report has been signed electronically.

## 2016-01-23 NOTE — Progress Notes (Signed)
Called to room to assist during endoscopic procedure.  Patient ID and intended procedure confirmed with present staff. Received instructions for my participation in the procedure from the performing physician.  

## 2016-01-26 ENCOUNTER — Telehealth: Payer: Self-pay

## 2016-01-26 NOTE — Telephone Encounter (Signed)
  Follow up Call-  Call back number 01/23/2016 12/05/2015  Post procedure Call Back phone  # [phone]  Permission to leave phone message Yes Yes  Some recent data might be hidden    Patient was called for follow up after his procedure on 01/23/2016. No answer at the number given for follow up phone call. A message was left on the answering machine.

## 2016-01-30 ENCOUNTER — Encounter: Payer: Self-pay | Admitting: Gastroenterology

## 2016-02-14 ENCOUNTER — Other Ambulatory Visit: Payer: Self-pay | Admitting: Family Medicine

## 2016-03-18 ENCOUNTER — Other Ambulatory Visit: Payer: Self-pay | Admitting: Family Medicine

## 2016-03-18 NOTE — Telephone Encounter (Signed)
Ok with refill 

## 2016-03-18 NOTE — Telephone Encounter (Signed)
Pt new pharmacy is [facility].  They will deliver to his home.  Pharmacist calling to get OK for Diazepam, it will be new Rx to them.  LRF 02/20/16 #60   LOV 10/03/15  OK refill?

## 2016-03-19 MED ORDER — DIAZEPAM 5 MG PO TABS: 5.0000 mg | ORAL_TABLET | Freq: Four times a day (QID) | ORAL | 0 refills | Status: DC | PRN

## 2016-03-19 NOTE — Telephone Encounter (Signed)
Rx called in 

## 2016-04-15 ENCOUNTER — Other Ambulatory Visit: Payer: Self-pay | Admitting: Family Medicine

## 2016-04-16 ENCOUNTER — Encounter: Payer: Self-pay | Admitting: Family Medicine

## 2016-05-12 ENCOUNTER — Telehealth: Payer: Self-pay | Admitting: Hematology and Oncology

## 2016-05-12 NOTE — Telephone Encounter (Signed)
Returned call to patient/ wife  in regards to rescheduling 11/20 appointment.

## 2016-05-17 ENCOUNTER — Other Ambulatory Visit: Payer: Self-pay | Admitting: Family Medicine

## 2016-05-17 MED ORDER — DIAZEPAM 5 MG PO TABS: 5.0000 mg | ORAL_TABLET | Freq: Four times a day (QID) | ORAL | 0 refills | Status: DC | PRN

## 2016-05-20 ENCOUNTER — Ambulatory Visit: Payer: 59 | Admitting: Hematology and Oncology

## 2016-05-23 NOTE — Assessment & Plan Note (Deleted)
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 Serum creatinine is 1.4 (most likely due to type 2 diabetes) Serum calcium 9 Hemoglobin 15 Kappa: Lambda ratio: 1.8  I discussed with the patient that there is no evidence of progression of his disease. Return to clinic in 6 months with recheck of labs and follow-up.

## 2016-05-24 ENCOUNTER — Ambulatory Visit: Payer: 59 | Admitting: Hematology and Oncology

## 2016-07-12 ENCOUNTER — Telehealth: Payer: Self-pay | Admitting: Hematology and Oncology

## 2016-07-12 NOTE — Telephone Encounter (Signed)
left voicemail message to advise that appointment has been changed from 07/21/16 to 07/26/16 at 3:15pm

## 2016-07-14 ENCOUNTER — Other Ambulatory Visit: Payer: 59

## 2016-07-21 ENCOUNTER — Ambulatory Visit: Payer: 59 | Admitting: Hematology and Oncology

## 2016-07-26 ENCOUNTER — Ambulatory Visit: Payer: 59 | Admitting: Hematology and Oncology

## 2016-08-11 ENCOUNTER — Other Ambulatory Visit: Payer: Self-pay | Admitting: Family Medicine

## 2016-08-11 NOTE — Telephone Encounter (Signed)
Ok to refill 

## 2016-08-12 NOTE — Telephone Encounter (Signed)
Medication called to pharmacy. 

## 2016-08-12 NOTE — Telephone Encounter (Signed)
ok 

## 2016-09-01 ENCOUNTER — Telehealth: Payer: Self-pay | Admitting: Hematology and Oncology

## 2016-09-01 NOTE — Telephone Encounter (Signed)
Pt wife called to r/s lab/MD appt in April. Gave pt new appt date/times

## 2016-09-02 ENCOUNTER — Other Ambulatory Visit: Payer: 59

## 2016-09-08 ENCOUNTER — Other Ambulatory Visit: Payer: Self-pay | Admitting: Family Medicine

## 2016-09-08 DIAGNOSIS — G4700 Insomnia, unspecified: Secondary | ICD-10-CM

## 2016-09-08 NOTE — Telephone Encounter (Signed)
Ok to refill 

## 2016-09-09 ENCOUNTER — Ambulatory Visit: Payer: 59 | Admitting: Hematology and Oncology

## 2016-09-09 NOTE — Telephone Encounter (Signed)
Medication called to pharmacy.  Call placed to patient daughter. LM on VM to schedule appointment.

## 2016-09-09 NOTE — Telephone Encounter (Signed)
Ok, due for labs

## 2016-09-23 ENCOUNTER — Other Ambulatory Visit: Payer: 59

## 2016-09-29 ENCOUNTER — Other Ambulatory Visit: Payer: Self-pay | Admitting: Family Medicine

## 2016-09-29 DIAGNOSIS — G4700 Insomnia, unspecified: Secondary | ICD-10-CM

## 2016-09-29 NOTE — Telephone Encounter (Signed)
Ok to refill 

## 2016-09-30 ENCOUNTER — Ambulatory Visit: Payer: 59 | Admitting: Hematology and Oncology

## 2016-09-30 NOTE — Telephone Encounter (Signed)
Medication called to pharmacy. 

## 2016-09-30 NOTE — Telephone Encounter (Signed)
ok 

## 2016-10-14 ENCOUNTER — Telehealth: Payer: Self-pay | Admitting: Hematology and Oncology

## 2016-10-14 ENCOUNTER — Other Ambulatory Visit: Payer: 59

## 2016-10-14 NOTE — Telephone Encounter (Signed)
Received call to r/s April appts to May due to pt being sick. Gave new appt date/times per request

## 2016-10-18 ENCOUNTER — Other Ambulatory Visit: Payer: Self-pay | Admitting: Family Medicine

## 2016-10-21 ENCOUNTER — Ambulatory Visit: Payer: 59 | Admitting: Hematology and Oncology

## 2016-10-22 ENCOUNTER — Other Ambulatory Visit: Payer: Self-pay | Admitting: Family Medicine

## 2016-10-22 ENCOUNTER — Other Ambulatory Visit: Payer: 59

## 2016-10-22 NOTE — Telephone Encounter (Signed)
Hasn't been seen in more than 1 year.  NTBS but okay for temporary refill.

## 2016-10-22 NOTE — Telephone Encounter (Signed)
Medication called to pharmacy.  Letter sent.  

## 2016-10-22 NOTE — Telephone Encounter (Signed)
Ok to refill 

## 2016-10-25 ENCOUNTER — Encounter: Payer: 59 | Admitting: Family Medicine

## 2016-10-28 ENCOUNTER — Emergency Department (HOSPITAL_COMMUNITY)
Admission: EM | Admit: 2016-10-28 | Discharge: 2016-10-29 | Disposition: A | Discharge status: Home / Self Care | Payer: 59 | Attending: Emergency Medicine | Admitting: Emergency Medicine

## 2016-10-28 ENCOUNTER — Encounter (HOSPITAL_COMMUNITY): Payer: Self-pay | Admitting: Nurse Practitioner

## 2016-10-28 ENCOUNTER — Other Ambulatory Visit: Payer: Self-pay | Admitting: Family Medicine

## 2016-10-28 DIAGNOSIS — I2510 Atherosclerotic heart disease of native coronary artery without angina pectoris: Secondary | ICD-10-CM | POA: Diagnosis not present

## 2016-10-28 DIAGNOSIS — Z794 Long term (current) use of insulin: Secondary | ICD-10-CM | POA: Diagnosis not present

## 2016-10-28 DIAGNOSIS — N281 Cyst of kidney, acquired: Secondary | ICD-10-CM | POA: Insufficient documentation

## 2016-10-28 DIAGNOSIS — K5900 Constipation, unspecified: Secondary | ICD-10-CM | POA: Insufficient documentation

## 2016-10-28 DIAGNOSIS — R1032 Left lower quadrant pain: Secondary | ICD-10-CM | POA: Diagnosis present

## 2016-10-28 DIAGNOSIS — I252 Old myocardial infarction: Secondary | ICD-10-CM | POA: Diagnosis not present

## 2016-10-28 DIAGNOSIS — Z7982 Long term (current) use of aspirin: Secondary | ICD-10-CM | POA: Diagnosis not present

## 2016-10-28 DIAGNOSIS — E119 Type 2 diabetes mellitus without complications: Secondary | ICD-10-CM | POA: Diagnosis not present

## 2016-10-28 DIAGNOSIS — Z79899 Other long term (current) drug therapy: Secondary | ICD-10-CM | POA: Insufficient documentation

## 2016-10-28 DIAGNOSIS — G4700 Insomnia, unspecified: Secondary | ICD-10-CM

## 2016-10-28 DIAGNOSIS — J449 Chronic obstructive pulmonary disease, unspecified: Secondary | ICD-10-CM | POA: Diagnosis not present

## 2016-10-28 HISTORY — DX: Unspecified asthma, uncomplicated: J45.909

## 2016-10-28 LAB — COMPREHENSIVE METABOLIC PANEL
ALBUMIN: 4.1 g/dL (ref 3.5–5.0)
ALT: 28 U/L (ref 17–63)
ANION GAP: 8 (ref 5–15)
AST: 25 U/L (ref 15–41)
Alkaline Phosphatase: 53 U/L (ref 38–126)
BILIRUBIN TOTAL: 0.5 mg/dL (ref 0.3–1.2)
BUN: 15 mg/dL (ref 6–20)
CALCIUM: 9.1 mg/dL (ref 8.9–10.3)
CO2: 28 mmol/L (ref 22–32)
CREATININE: 1.19 mg/dL (ref 0.61–1.24)
Chloride: 104 mmol/L (ref 101–111)
GFR calc Af Amer: >60 mL/min (ref >60)
GFR calc non Af Amer: >60 mL/min (ref >60)
Glucose, Bld: 172 mg/dL — ABNORMAL HIGH (ref 65–99)
Potassium: 3.7 mmol/L (ref 3.5–5.1)
Sodium: 140 mmol/L (ref 135–145)
TOTAL PROTEIN: 6.8 g/dL (ref 6.5–8.1)

## 2016-10-28 LAB — URINALYSIS, ROUTINE W REFLEX MICROSCOPIC
Bilirubin Urine: NEGATIVE
GLUCOSE, UA: NEGATIVE mg/dL
Hgb urine dipstick: NEGATIVE
Ketones, ur: NEGATIVE mg/dL
Nitrite: NEGATIVE
PROTEIN: NEGATIVE mg/dL
SPECIFIC GRAVITY, URINE: 1.021 (ref 1.005–1.030)
pH: 5 (ref 5.0–8.0)

## 2016-10-28 LAB — CBC
HCT: 46.6 % (ref 39.0–52.0)
HEMOGLOBIN: 15.7 g/dL (ref 13.0–17.0)
MCH: 29.7 pg (ref 26.0–34.0)
MCHC: 33.7 g/dL (ref 30.0–36.0)
MCV: 88.1 fL (ref 78.0–100.0)
Platelets: 248 10*3/uL (ref 150–400)
RBC: 5.29 MIL/uL (ref 4.22–5.81)
RDW: 13.6 % (ref 11.5–15.5)
WBC: 9.9 10*3/uL (ref 4.0–10.5)

## 2016-10-28 LAB — I-STAT CG4 LACTIC ACID, ED: LACTIC ACID, VENOUS: 1.28 mmol/L (ref 0.5–1.9)

## 2016-10-28 MED ORDER — HYDROMORPHONE HCL 1 MG/ML IJ SOLN
1.0000 mg | Freq: Once | INTRAMUSCULAR | Status: AC
  Administered 2016-10-28: 1 mg via INTRAVENOUS
  Filled 2016-10-28: qty 1

## 2016-10-28 MED ORDER — IOPAMIDOL (ISOVUE-300) INJECTION 61%
INTRAVENOUS | Status: AC
  Administered 2016-10-29: 100 mL
  Filled 2016-10-28: qty 100

## 2016-10-28 MED ORDER — ONDANSETRON HCL 4 MG/2ML IJ SOLN
4.0000 mg | Freq: Once | INTRAMUSCULAR | Status: AC
  Administered 2016-10-28: 4 mg via INTRAVENOUS
  Filled 2016-10-28: qty 2

## 2016-10-28 NOTE — ED Notes (Signed)
Patient transported to CT 

## 2016-10-28 NOTE — ED Provider Notes (Signed)
[facility]-EMERGENCY DEPT Provider Note   CSN: [id number] Arrival date & time: 10/28/16  1838     History   Chief Complaint Chief Complaint  Patient presents with  . Abdominal Pain    HPI Dennis Vasquez is a 71 y.o. male.  Patient with history of atherosclerotic disease, COPD, MI, kidney stone, pneumonia -- presents with acute onset of left lateral abdominal pain starting after eating this morning. Pain was mild at first but became worse. He describes a dull pain. Family member notes that he has appeared uncomfortable. Patient has had nausea but no vomiting. Patient had 2 hard nonbloody bowel movements this morning, no diarrhea. No urinary symptoms. Some associated shortness of breath without chest pain, cough, or fever. No treatments PTA. The onset of this condition was acute. The course is constant. Aggravating factors: none. Alleviating factors: none.        Past Medical History:  Diagnosis Date  . Allergic rhinitis   . BPH (benign prostatic hyperplasia)   . Chlorine inhalation lung injury (HCC) 1998      . COPD (chronic obstructive pulmonary disease) (HCC)   . Coronary artery disease 2006   Non obstructive on cath 2006;  Myoview 07/21/11: EF of 61%, and small partially reversible inferior and apical defect consistent with inferior and apical thinning and mild inferior ischemia.  LHC and RHC 08/13/11: PCWP 17, CO2 7.4, CI 2.8, proximal LAD 40-50%, mid RCA 50%, distal RCA 50%, EF 55-65%, essentially normal intracardiac hemodynamics  . Diabetes mellitus    Type 2 IDDM x 10 yrs  . Elevated triglycerides with high cholesterol   . GERD (gastroesophageal reflux disease)   . Heart murmur   . History of kidney stones   . HNP (herniated nucleus pulposus), lumbar   . Hx of colonic polyps   . Hyperlipidemia   . Hypertension   . Myocardial infarction (HCC)   . OSA (obstructive sleep apnea) 06/27/2012  . Osteoarthritis    left knee  . Pneumonia ~2001   out patient  . Pulmonary nodule     6mm, stable Dec 2005 through Dec 2006 and May 2009, no further follow-up  . Shortness of breath dyspnea    walking  . Sleep apnea    mild no cpap    Patient Active Problem List   Diagnosis Date Noted  . 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  . Hereditary and idiopathic peripheral neuropathy 05/08/2015  . Numbness 05/08/2015  . Weakness 05/08/2015  . Leg cramps 05/08/2015  . Leg weakness, bilateral 05/08/2015  . S/P lumbar spinal fusion 06/21/2014  . Dyspnea 05/09/2014  . Chest pain 05/09/2014  . GERD (gastroesophageal reflux disease) 04/16/2014  . OSA (obstructive sleep apnea) 06/27/2012  . DM (diabetes mellitus), type 2, uncontrolled (HCC) 06/04/2012  . Morbid obesity (HCC) 06/03/2012  . Chronic respiratory failure with hypoxia (HCC) 06/03/2012  . Routine general medical examination at a health care facility 06/07/2011  . Lumbar disc disease 12/07/2010  . BENIGN PROSTATIC HYPERTROPHY 05/14/2010  . OSTEOARTHRITIS 05/14/2010  . LUMBAR SPRAIN AND STRAIN 05/05/2009  . 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 HYPERLIPIDEMIA 02/14/2007  . Essential hypertension 02/14/2007  . ALLERGIC RHINITIS 02/14/2007  . ERECTILE DYSFUNCTION 02/02/2007  . Coronary atherosclerosis 02/02/2007    Past Surgical History:  Procedure Laterality Date  . BACK SURGERY  08/2011   lumbar lamscrews and rods  . CARDIAC CATHETERIZATION  08/2011   Dr Cooper  . CARDIOVASCULAR STRESS TEST  2003?  . cervical dis   repair  1997  . LUMBAR LAMINECTOMY/DECOMPRESSION MICRODISCECTOMY  11/10/2011   Procedure: LUMBAR LAMINECTOMY/DECOMPRESSION MICRODISCECTOMY 1 LEVEL;  Surgeon: Eustace Moore, MD;  Location: [facility];  Service: Neurosurgery;  Laterality: Right;  redo lumbar three - four  . MAXIMUM ACCESS (MAS)POSTERIOR LUMBAR INTERBODY FUSION (PLIF) 1 LEVEL N/A 06/21/2014   Procedure: FOR MAXIMUM ACCESS (MAS) POSTERIOR LUMBAR INTERBODY FUSION LUMBAR THREE TO FOUR (PLIF) 1 LEVEL;  Surgeon:  Eustace Moore, MD;  Location: [facility];  Service: Neurosurgery;  Laterality: N/A;  . pneumonia  2001   out pt  . SPINE SURGERY    . TONSILLECTOMY         Home Medications    Prior to Admission medications   Medication Sig Start Date End Date Taking? Authorizing Provider  amLODipine-benazepril (LOTREL) 10-40 MG capsule Take 1 capsule by mouth  daily 12/02/15   Susy Frizzle, MD  aspirin 81 MG tablet Take 81 mg by mouth daily.      Historical Provider, MD  blood glucose meter kit and supplies KIT Dispense based on patient and insurance preference. Check BS QID E11.9 11/17/15   Susy Frizzle, MD  Blood Glucose Monitoring Suppl (ONE TOUCH ULTRA SYSTEM KIT) w/Device KIT USE TO TEST BLOOD SUGAR THREE TIMES DAILY- PLEASE DISPENSE ONE Margurite Auerbach II- DX: E11.65. 11/19/15   Susy Frizzle, MD  BYSTOLIC 5 MG tablet Take 1 tablet by mouth  daily 11/04/15   Susy Frizzle, MD  cloNIDine (CATAPRES) 0.3 MG tablet Take 1 tablet by mouth 3  times daily 11/04/15   Susy Frizzle, MD  diazepam (VALIUM) 5 MG tablet TAKE 1 TABLET BY MOUTH EVERY 6 HOURS AS NEEDED FOR ANXIETY 10/22/16   Susy Frizzle, MD  furosemide (LASIX) 40 MG tablet Take 1 tablet (40 mg total) by mouth 2 (two) times daily. 10/15/15   Susy Frizzle, MD  glucose blood (ONE TOUCH ULTRA TEST) test strip USE TO TEST BLOOD SUGAR  THREE TIMES DAILY 11/11/15   Susy Frizzle, MD  glucose blood test strip Use as instructed 08/26/14   Susy Frizzle, MD  hydrALAZINE (APRESOLINE) 100 MG tablet TAKE 1 TABLET BY MOUTH 3 TIMES DAILY 10/18/16   Susy Frizzle, MD  insulin lispro (HUMALOG KWIKPEN) 100 UNIT/ML KiwkPen USE SLIDING SCALE; INJECT SUBCUTANEOUSLY MAX 80 UNITS EVERY MORNING AND 60 UNITS EVERY NOON AND 25 UNITS EVERY NIGHT AT BEDTIME 10/15/15   Susy Frizzle, MD  Insulin Pen Needle 32G X 4 MM MISC Inject 4 x a day 09/24/13   Susy Frizzle, MD  LANTUS SOLOSTAR 100 UNIT/ML Solostar Pen Inject subcutaneously 75  units (0.70m)  every  morning 02/16/16   WSusy Frizzle MD  loratadine (CLARITIN) 10 MG tablet Take 1 tablet (10 mg total) by mouth daily. 10/15/15   WSusy Frizzle MD  Multiple Vitamin (MULTIVITAMIN) tablet Take 1 tablet by mouth daily.      Historical Provider, MD  Omega-3 Fatty Acids (FISH OIL) 1200 MG CAPS Take 1,200 mg by mouth 2 (two) times daily.    Historical Provider, MD  omeprazole (PRILOSEC) 20 MG capsule Take 1 capsule (20 mg total) by mouth daily. 10/15/15   WSusy Frizzle MD  oxyCODONE-acetaminophen (ROXICET) 5-325 MG tablet Take 1 tablet by mouth every 6 (six) hours as needed for severe pain. Patient not taking: Reported on 01/16/2016 10/03/15   WSusy Frizzle MD  potassium chloride SA (K-DUR,KLOR-CON) 20 MEQ tablet Take 1  tablet by mouth two  times daily 10/15/15   Warren T Pickard, MD  pravastatin (PRAVACHOL) 40 MG tablet Take 1 tablet (40 mg total) by mouth daily. 10/15/15   Warren T Pickard, MD  tiZANidine (ZANAFLEX) 4 MG tablet Take 1 tablet by mouth 4 (four) times daily as needed for muscle spasms. Reported on 12/05/2015 05/03/14   Historical Provider, MD  zolpidem (AMBIEN) 10 MG tablet TAKE 1 TABLET BY MOUTH AT BEDTIME 10/28/16   Kawanta F Weiner, MD    Family History Family History  Problem Relation Age of Onset  . Prostate cancer Father   . Aneurysm Father     AAA  . Heart disease Neg Hx     Negative FH for CAD  . Hypertension Neg Hx   . Diabetes Neg Hx   . Anesthesia problems Neg Hx   . Neuropathy Neg Hx   . Colon cancer Neg Hx     Social History Social History  Substance Use Topics  . Smoking status: Former Smoker    Packs/day: 2.00    Years: 40.00    Types: Cigarettes    Quit date: 07/06/2003  . Smokeless tobacco: Never Used  . Alcohol use No     Allergies   Duloxetine; Elavil [amitriptyline hcl]; and Lyrica [pregabalin]   Review of Systems Review of Systems  Constitutional: Negative for fever.  HENT: Negative for rhinorrhea and sore throat.   Eyes:  Negative for redness.  Respiratory: Positive for shortness of breath. Negative for cough.   Cardiovascular: Negative for chest pain.  Gastrointestinal: Positive for abdominal pain and nausea. Negative for diarrhea and vomiting.  Genitourinary: Negative for dysuria.  Musculoskeletal: Negative for myalgias.  Skin: Negative for rash.  Neurological: Negative for headaches.     Physical Exam Updated Vital Signs BP (!) 150/67   Pulse (!) 58   Temp 97.9 F (36.6 C) (Oral)   Resp 17   SpO2 95%   Physical Exam  Constitutional: He appears well-developed and well-nourished.  HENT:  Head: Normocephalic and atraumatic.  Mouth/Throat: Oropharynx is clear and moist.  Eyes: Conjunctivae are normal. Right eye exhibits no discharge. Left eye exhibits no discharge.  Neck: Normal range of motion. Neck supple.  Cardiovascular: Normal rate and regular rhythm.   No murmur heard. Distant heart sounds  Pulmonary/Chest: Effort normal and breath sounds normal. No respiratory distress. He has no wheezes. He has no rales.  Abdominal: Soft. There is tenderness (moderate, L lateral abdomen). There is no rebound and no guarding.  Neurological: He is alert.  Skin: Skin is warm and dry.  Psychiatric: He has a normal mood and affect.  Nursing note and vitals reviewed.    ED Treatments / Results  Labs (all labs ordered are listed, but only abnormal results are displayed) Labs Reviewed  COMPREHENSIVE METABOLIC PANEL - Abnormal; Notable for the following:       Result Value   Glucose, Bld 172 (*)    All other components within normal limits  URINALYSIS, ROUTINE W REFLEX MICROSCOPIC - Abnormal; Notable for the following:    APPearance HAZY (*)    Leukocytes, UA SMALL (*)    Bacteria, UA RARE (*)    Squamous Epithelial / LPF 0-5 (*)    All other components within normal limits  CBC  I-STAT CG4 LACTIC ACID, ED    EKG  EKG Interpretation None       Radiology Ct Abdomen Pelvis W  Contrast  Result Date: 10/29/2016 CLINICAL DATA:    70-year-old male with left-sided abdominal pain, and nausea. EXAM: CT ABDOMEN AND PELVIS WITH CONTRAST TECHNIQUE: Multidetector CT imaging of the abdomen and pelvis was performed using the standard protocol following bolus administration of intravenous contrast. CONTRAST:  100mL ISOVUE-300 IOPAMIDOL (ISOVUE-300) INJECTION 61% COMPARISON:  Chest CT dated 04/23/2014 FINDINGS: Lower chest: There is a faint 3 mm subpleural nodular density in the right middle lobe (series 5, image 1) which is similar to the prior CT of 2015. Left lung base atelectatic changes noted. No intra-abdominal free air or free fluid. Hepatobiliary: No focal liver abnormality is seen. No gallstones, gallbladder wall thickening, or biliary dilatation. Pancreas: Unremarkable. No pancreatic ductal dilatation or surrounding inflammatory changes. Spleen: Normal in size without focal abnormality. Adrenals/Urinary Tract: The adrenal glands are unremarkable. Bilateral renal hypodense lesions measure up to 6.3 cm in the upper pole of the right kidney. The larger lesions demonstrate fluid attenuation most consistent with cysts. The smaller lesions are not well characterized but likely represent cysts as well. There is mild stranding in the perinephric fat adjacent to the left renal interpolar cyst. Cyst rupture or superimposed infection is not excluded. Correlation with urinalysis recommended. There is no hydronephrosis on either side. There is symmetric uptake and excretion of contrast by kidneys bilaterally. The visualized ureters appear unremarkable. The urinary bladder is only partially distended and grossly unremarkable. Stomach/Bowel: There scattered sigmoid diverticula without active inflammatory changes. There is a 3.5 cm duodenal diverticulum. Moderate stool noted throughout the colon. There is no evidence of bowel obstruction or active inflammation. Normal appendix. Vascular/Lymphatic: There is  moderate aortoiliac atherosclerotic disease. There is a 2.5 cm infrarenal aortic ectasia. 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 The origins of the celiac axis, SMA, IMA and the renal arteries appear patent. The SMV, splenic vein, and main portal vein are patent. No portal venous gas identified. There is no adenopathy. Reproductive: The prostate and seminal vesicles are grossly unremarkable. Coarse calcification of the prostate gland noted. Other: None Musculoskeletal: There is degenerative changes of the spine with osteophyte formation. L3-L4 laminectomy, disc spacer, and posterior fusion hardware. No acute osseous pathology. IMPRESSION: 1. Bilateral renal cysts. Mild stranding adjacent to the left kidney, nonspecific. Cyst rupture or superimposed infection is not excluded. Correlation with clinical exam and urinalysis recommended. No hydronephrosis. 2. Constipation. No bowel obstruction or active inflammation. Normal appendix. 3. Scattered colonic diverticula without active inflammatory changes. 4. Atherosclerotic and mildly ectatic aorta. 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 Electronically Signed   By: Arash  Radparvar M.D.   On: 10/29/2016 00:43    Procedures Procedures (including critical care time)  Medications Ordered in ED Medications - No data to display   Initial Impression / Assessment and Plan / ED Course  I have reviewed the triage vital signs and the nursing notes.  Pertinent labs & imaging results that were available during my care of the patient were reviewed by me and considered in my medical decision making (see chart for details).     10:05 PM Patient seen and examined. Added lactic acid, CT abd/pelvis. Medications ordered. Considered CT angio to eval for mesenteric ischemia but pain is very focal and patient is more locally tender to palpation that I would otherwise expect.   Vital signs reviewed and  are as follows: BP (!) 150/67   Pulse (!) 58   Temp 97.9 F (36.6 C) (Oral)   Resp 17   SpO2 95%     1:07 AM CT results discussed with patient. Possible ruptured left-sided renal cyst causing pain. Also infection is a possibility. Patient was 6-30 white cells and rare bacteria in the urine. He does not clinically present as pyelo but will cover for UTI with Keflex.  Also discussed constipation. Patient uses MiraLAX as needed as well as sometimes mag citrate and Colace. I encouraged him to use these over the next week to help clear any constipation he may have.  We'll give small amount of pain medication to use only with severe pain. Patient otherwise use Tylenol. Discussed constipation risk with this medication. Patient counseled on use of narcotic pain medications. Counseled not to combine these medications with others containing tylenol. Urged not to drink alcohol, drive, or perform any other activities that requires focus while taking these medications. The patient verbalizes understanding and agrees with the plan.  The patient was urged to return to the Emergency Department immediately with worsening of current symptoms, worsening abdominal pain, persistent vomiting, blood noted in stools, fever, or any other concerns. The patient verbalized understanding.     Final Clinical Impressions(s) / ED Diagnoses   Final diagnoses:  Left lower quadrant pain  Bilateral renal cysts  Constipation, unspecified constipation type   Patient with left sided abdominal pain, acute onset today and worsening. CT findings as above. Possible etiologies as above. Patient appears well, nontoxic. No active vomiting here. The chest pain or shortness of breath. Treatment as above, return instructions as above.  New Prescriptions New Prescriptions   No medications on file      , PA-C 10/29/16 0109    Joseph Zammit, MD 10/29/16 1545  

## 2016-10-28 NOTE — Telephone Encounter (Signed)
Ok to refill 

## 2016-10-28 NOTE — ED Notes (Signed)
Josh Geiple, PA at bedside at this time.  

## 2016-10-28 NOTE — Telephone Encounter (Signed)
ok 

## 2016-10-28 NOTE — Telephone Encounter (Signed)
Medication called to pharmacy. 

## 2016-10-28 NOTE — ED Triage Notes (Signed)
Pt presents with c/o LLQ abd pain. The pain began this morning after eating peanut butter sandwich and glass of milk. The pain was intermittent this morning but has become constant and this afternoon. He reports nausea. He denies vomiting, diarrhea. He has had 2 epsisodes of hard stools today. He took some nausea medication with no relief

## 2016-10-29 ENCOUNTER — Emergency Department (HOSPITAL_COMMUNITY): Payer: 59

## 2016-10-29 ENCOUNTER — Encounter (HOSPITAL_COMMUNITY): Payer: Self-pay | Admitting: Radiology

## 2016-10-29 MED ORDER — HYDROCODONE-ACETAMINOPHEN 5-325 MG PO TABS: ORAL_TABLET | ORAL | 0 refills | Status: DC

## 2016-10-29 MED ORDER — ONDANSETRON 4 MG PO TBDP: 4.0000 mg | ORAL_TABLET | Freq: Three times a day (TID) | ORAL | 0 refills | Status: DC | PRN

## 2016-10-29 MED ORDER — CEPHALEXIN 250 MG PO CAPS
500.0000 mg | ORAL_CAPSULE | Freq: Once | ORAL | Status: AC
  Administered 2016-10-29: 500 mg via ORAL
  Filled 2016-10-29: qty 2

## 2016-10-29 MED ORDER — CEPHALEXIN 500 MG PO CAPS: 500.0000 mg | ORAL_CAPSULE | Freq: Three times a day (TID) | ORAL | 0 refills | Status: DC

## 2016-10-29 NOTE — Discharge Instructions (Signed)
Please read and follow all provided instructions.  Your diagnoses today include:  1. Left lower quadrant pain   2. Bilateral renal cysts   3. Constipation, unspecified constipation type     Tests performed today include:  Blood counts and electrolytes  Blood tests to check liver and kidney function  Blood tests to check pancreas function  Urine test to look for infection - Shows possible urinary infection  CT scan - shows inflammation around the left kidney possibly due to a ruptured cyst or less likely infection, also constipation  Vital signs. See below for your results today.   Medications prescribed:   Keflex (cephalexin) - antibiotic  You have been prescribed an antibiotic medicine: take the entire course of medicine even if you are feeling better. Stopping early can cause the antibiotic not to work.   Zofran (ondansetron) - for nausea and vomiting   Vicodin (hydrocodone/acetaminophen) - narcotic pain medication  DO NOT drive or perform any activities that require you to be awake and alert because this medicine can make you drowsy. BE VERY CAREFUL not to take multiple medicines containing Tylenol (also called acetaminophen). Doing so can lead to an overdose which can damage your liver and cause liver failure and possibly death.  Take any prescribed medications only as directed.  Home care instructions:   Follow any educational materials contained in this packet.  Follow-up instructions: Please follow-up with your primary care provider in the next 3 days for further evaluation of your symptoms.    Return instructions:  SEEK IMMEDIATE MEDICAL ATTENTION IF:  The pain does not go away or becomes severe   A temperature above 101F develops   Repeated vomiting occurs (multiple episodes)   The pain becomes localized to portions of the abdomen. The right side could possibly be appendicitis. In an adult, the left lower portion of the abdomen could be colitis or  diverticulitis.   Blood is being passed in stools or vomit (bright red or black tarry stools)   You develop chest pain, difficulty breathing, dizziness or fainting, or become confused, poorly responsive, or inconsolable (young children)  If you have any other emergent concerns regarding your health  Additional Information: Abdominal (belly) pain can be caused by many things. Your caregiver performed an examination and possibly ordered blood/urine tests and imaging (CT scan, x-rays, ultrasound). Many cases can be observed and treated at home after initial evaluation in the emergency department. Even though you are being discharged home, abdominal pain can be unpredictable. Therefore, you need a repeated exam if your pain does not resolve, returns, or worsens. Most patients with abdominal pain don't have to be admitted to the hospital or have surgery, but serious problems like appendicitis and gallbladder attacks can start out as nonspecific pain. Many abdominal conditions cannot be diagnosed in one visit, so follow-up evaluations are very important.  Your vital signs today were: BP 123/69    Pulse (!) 55    Temp 97.9 F (36.6 C) (Oral)    Resp (!) 8    SpO2 95%  If your blood pressure (bp) was elevated above 135/85 this visit, please have this repeated by your doctor within one month. --------------

## 2016-10-30 LAB — URINE CULTURE: Culture: 50000 — AB

## 2016-10-31 ENCOUNTER — Telehealth: Payer: Self-pay

## 2016-10-31 NOTE — Telephone Encounter (Signed)
Post ED Visit - Positive Culture Follow-up  Culture report reviewed by antimicrobial stewardship pharmacist:  []  Elenor Quinones, Pharm.D. []  Heide Guile, Pharm.D., BCPS AQ-ID []  Parks Neptune, Pharm.D., BCPS []  Alycia Rossetti, Pharm.D., BCPS []  [place], Pharm.D., BCPS, AAHIVP []  Legrand Como, Pharm.D., BCPS, AAHIVP []  Salome Arnt, PharmD, BCPS [x]  Dimitri Ped, PharmD, BCPS []  Vincenza Hews, PharmD, BCPS  Positive urine culture Treated with Cephalexin, organism sensitive to the same and no further patient follow-up is required at this time.  Genia Del 10/31/2016, 9:13 AM

## 2016-11-05 ENCOUNTER — Other Ambulatory Visit: Payer: Self-pay | Admitting: Family Medicine

## 2016-11-05 ENCOUNTER — Encounter: Payer: Self-pay | Admitting: Family Medicine

## 2016-11-05 ENCOUNTER — Ambulatory Visit (INDEPENDENT_AMBULATORY_CARE_PROVIDER_SITE_OTHER): Payer: 59 | Admitting: Family Medicine

## 2016-11-05 VITALS — BP 150/80 | HR 78 | Temp 97.9°F | Resp 20 | Ht 75.0 in | Wt 319.0 lb

## 2016-11-05 DIAGNOSIS — M549 Dorsalgia, unspecified: Secondary | ICD-10-CM | POA: Diagnosis not present

## 2016-11-05 DIAGNOSIS — N10 Acute tubulo-interstitial nephritis: Secondary | ICD-10-CM

## 2016-11-05 MED ORDER — CEPHALEXIN 500 MG PO CAPS: 500.0000 mg | ORAL_CAPSULE | Freq: Three times a day (TID) | ORAL | 0 refills | Status: DC
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
## 2016-11-23 ENCOUNTER — Telehealth: Payer: Self-pay

## 2016-11-23 NOTE — Telephone Encounter (Signed)
Called and left a message with a new  appt due to dr out of office 5/31  Webb Silversmith

## 2016-11-24 ENCOUNTER — Other Ambulatory Visit: Payer: Self-pay | Admitting: Family Medicine

## 2016-11-24 NOTE — Telephone Encounter (Signed)
Ok, no refll

## 2016-11-24 NOTE — Telephone Encounter (Signed)
Ok to refill??      With refills?

## 2016-11-25 ENCOUNTER — Other Ambulatory Visit: Payer: 59

## 2016-12-02 ENCOUNTER — Ambulatory Visit: Payer: 59 | Admitting: Hematology and Oncology

## 2016-12-08 ENCOUNTER — Other Ambulatory Visit: Payer: Self-pay | Admitting: Neurological Surgery

## 2016-12-08 DIAGNOSIS — M545 Low back pain, unspecified: Secondary | ICD-10-CM

## 2016-12-14 ENCOUNTER — Other Ambulatory Visit: Payer: Self-pay | Admitting: Family Medicine

## 2016-12-15 ENCOUNTER — Ambulatory Visit: Payer: 59 | Admitting: Hematology and Oncology

## 2016-12-19 ENCOUNTER — Ambulatory Visit
Admission: RE | Admit: 2016-12-19 | Discharge: 2016-12-19 | Disposition: A | Discharge status: Home / Self Care | Payer: 59 | Source: Ambulatory Visit | Attending: Neurological Surgery | Admitting: Neurological Surgery

## 2016-12-19 DIAGNOSIS — M545 Low back pain, unspecified: Secondary | ICD-10-CM

## 2016-12-22 ENCOUNTER — Other Ambulatory Visit: Payer: 59

## 2016-12-22 DIAGNOSIS — E119 Type 2 diabetes mellitus without complications: Secondary | ICD-10-CM

## 2016-12-22 DIAGNOSIS — I10 Essential (primary) hypertension: Secondary | ICD-10-CM

## 2016-12-22 DIAGNOSIS — E785 Hyperlipidemia, unspecified: Secondary | ICD-10-CM

## 2016-12-22 LAB — CBC WITH DIFFERENTIAL/PLATELET
BASOS PCT: 0 %
Basophils Absolute: 0 cells/uL (ref 0–200)
EOS ABS: 196 {cells}/uL (ref 15–500)
Eosinophils Relative: 2 %
HEMATOCRIT: 47.4 % (ref 38.5–50.0)
Hemoglobin: 15.1 g/dL (ref 13.0–17.0)
LYMPHS PCT: 20 %
Lymphs Abs: 1960 cells/uL (ref 850–3900)
MCH: 28.9 pg (ref 27.0–33.0)
MCHC: 31.9 g/dL — ABNORMAL LOW (ref 32.0–36.0)
MCV: 90.6 fL (ref 80.0–100.0)
MONO ABS: 686 {cells}/uL (ref 200–950)
MONOS PCT: 7 %
MPV: 9.4 fL (ref 7.5–12.5)
NEUTROS PCT: 71 %
Neutro Abs: 6958 cells/uL (ref 1500–7800)
PLATELETS: 262 10*3/uL (ref 140–400)
RBC: 5.23 MIL/uL (ref 4.20–5.80)
RDW: 14.1 % (ref 11.0–15.0)
WBC: 9.8 10*3/uL (ref 3.8–10.8)

## 2016-12-22 LAB — LIPID PANEL
Cholesterol: 146 mg/dL (ref <200)
HDL: 39 mg/dL — AB (ref >40)
LDL CALC: 71 mg/dL (ref <100)
Total CHOL/HDL Ratio: 3.7 Ratio (ref <5.0)
Triglycerides: 179 mg/dL — ABNORMAL HIGH (ref <150)
VLDL: 36 mg/dL — ABNORMAL HIGH (ref <30)

## 2016-12-23 LAB — HEMOGLOBIN A1C
HEMOGLOBIN A1C: 6.4 % — AB (ref <5.7)
MEAN PLASMA GLUCOSE: 137 mg/dL

## 2016-12-24 ENCOUNTER — Emergency Department (HOSPITAL_COMMUNITY): Payer: 59

## 2016-12-24 ENCOUNTER — Encounter (HOSPITAL_COMMUNITY): Payer: Self-pay | Admitting: Emergency Medicine

## 2016-12-24 ENCOUNTER — Emergency Department (HOSPITAL_COMMUNITY)
Admission: EM | Admit: 2016-12-24 | Discharge: 2016-12-24 | Disposition: A | Discharge status: Home / Self Care | Payer: 59 | Attending: Emergency Medicine | Admitting: Emergency Medicine

## 2016-12-24 DIAGNOSIS — Z87891 Personal history of nicotine dependence: Secondary | ICD-10-CM | POA: Diagnosis not present

## 2016-12-24 DIAGNOSIS — R1012 Left upper quadrant pain: Secondary | ICD-10-CM | POA: Diagnosis present

## 2016-12-24 DIAGNOSIS — I2510 Atherosclerotic heart disease of native coronary artery without angina pectoris: Secondary | ICD-10-CM | POA: Insufficient documentation

## 2016-12-24 DIAGNOSIS — Z7982 Long term (current) use of aspirin: Secondary | ICD-10-CM | POA: Insufficient documentation

## 2016-12-24 DIAGNOSIS — Z79899 Other long term (current) drug therapy: Secondary | ICD-10-CM | POA: Insufficient documentation

## 2016-12-24 DIAGNOSIS — E119 Type 2 diabetes mellitus without complications: Secondary | ICD-10-CM | POA: Insufficient documentation

## 2016-12-24 DIAGNOSIS — Z794 Long term (current) use of insulin: Secondary | ICD-10-CM | POA: Insufficient documentation

## 2016-12-24 DIAGNOSIS — J449 Chronic obstructive pulmonary disease, unspecified: Secondary | ICD-10-CM | POA: Diagnosis not present

## 2016-12-24 DIAGNOSIS — I10 Essential (primary) hypertension: Secondary | ICD-10-CM | POA: Insufficient documentation

## 2016-12-24 DIAGNOSIS — J45909 Unspecified asthma, uncomplicated: Secondary | ICD-10-CM | POA: Diagnosis not present

## 2016-12-24 LAB — COMPREHENSIVE METABOLIC PANEL
ALK PHOS: 55 U/L (ref 38–126)
ALT: 28 U/L (ref 17–63)
AST: 25 U/L (ref 15–41)
Albumin: 4.2 g/dL (ref 3.5–5.0)
Anion gap: 7 (ref 5–15)
BILIRUBIN TOTAL: 0.9 mg/dL (ref 0.3–1.2)
BUN: 11 mg/dL (ref 6–20)
CALCIUM: 9.2 mg/dL (ref 8.9–10.3)
CO2: 30 mmol/L (ref 22–32)
CREATININE: 1.2 mg/dL (ref 0.61–1.24)
Chloride: 103 mmol/L (ref 101–111)
GFR calc Af Amer: >60 mL/min (ref >60)
GFR calc non Af Amer: >60 mL/min (ref >60)
GLUCOSE: 147 mg/dL — AB (ref 65–99)
Potassium: 3.8 mmol/L (ref 3.5–5.1)
Sodium: 140 mmol/L (ref 135–145)
TOTAL PROTEIN: 7.1 g/dL (ref 6.5–8.1)

## 2016-12-24 LAB — CBC
HCT: 48 % (ref 39.0–52.0)
HEMOGLOBIN: 15.8 g/dL (ref 13.0–17.0)
MCH: 29.3 pg (ref 26.0–34.0)
MCHC: 32.9 g/dL (ref 30.0–36.0)
MCV: 89.1 fL (ref 78.0–100.0)
PLATELETS: 261 10*3/uL (ref 150–400)
RBC: 5.39 MIL/uL (ref 4.22–5.81)
RDW: 13.6 % (ref 11.5–15.5)
WBC: 11.3 10*3/uL — ABNORMAL HIGH (ref 4.0–10.5)

## 2016-12-24 LAB — URINALYSIS, ROUTINE W REFLEX MICROSCOPIC
Bilirubin Urine: NEGATIVE
Glucose, UA: NEGATIVE mg/dL
HGB URINE DIPSTICK: NEGATIVE
KETONES UR: NEGATIVE mg/dL
Leukocytes, UA: NEGATIVE
NITRITE: NEGATIVE
Protein, ur: NEGATIVE mg/dL
Specific Gravity, Urine: 1.013 (ref 1.005–1.030)
pH: 7 (ref 5.0–8.0)

## 2016-12-24 LAB — LIPASE, BLOOD: Lipase: 23 U/L (ref 11–51)

## 2016-12-24 MED ORDER — POLYETHYLENE GLYCOL 3350 17 G PO PACK: 17.0000 g | PACK | Freq: Every day | ORAL | 0 refills | Status: AC

## 2016-12-24 MED ORDER — IOPAMIDOL (ISOVUE-300) INJECTION 61%
INTRAVENOUS | Status: AC
  Filled 2016-12-24: qty 100

## 2016-12-24 MED ORDER — DICYCLOMINE HCL 20 MG PO TABS: 20.0000 mg | ORAL_TABLET | Freq: Two times a day (BID) | ORAL | 0 refills | Status: DC | PRN

## 2016-12-24 MED ORDER — ONDANSETRON HCL 4 MG/2ML IJ SOLN
4.0000 mg | Freq: Once | INTRAMUSCULAR | Status: AC
  Administered 2016-12-24: 4 mg via INTRAVENOUS
  Filled 2016-12-24: qty 2

## 2016-12-24 MED ORDER — MORPHINE SULFATE (PF) 4 MG/ML IV SOLN
4.0000 mg | Freq: Once | INTRAVENOUS | Status: AC
  Administered 2016-12-24: 4 mg via INTRAVENOUS
  Filled 2016-12-24: qty 1

## 2016-12-24 MED ORDER — IOPAMIDOL (ISOVUE-300) INJECTION 61%
100.0000 mL | Freq: Once | INTRAVENOUS | Status: AC | PRN
  Administered 2016-12-24: 100 mL via INTRAVENOUS

## 2016-12-24 MED ORDER — ONDANSETRON HCL 4 MG PO TABS: 4.0000 mg | ORAL_TABLET | Freq: Three times a day (TID) | ORAL | 0 refills | Status: DC | PRN
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
## 2016-12-24 NOTE — ED Triage Notes (Signed)
Pt to ER for LLQ abd pain onset last night. Distention noted. Reports nausea without vomiting. Denies diarrhea. VSS. A/o x4.

## 2016-12-24 NOTE — Discharge Instructions (Signed)
Read the information below.  Use the prescribed medication as directed.  Please discuss all new medications with your pharmacist.  You may return to the Emergency Department at any time for worsening condition or any new symptoms that concern you.   If you develop high fevers, worsening abdominal pain, uncontrolled vomiting, or are unable to tolerate fluids by mouth, return to the ER for a recheck.  ° °

## 2016-12-24 NOTE — ED Notes (Signed)
Pt verbalized understanding of d/c instructions and has no further questions. Pt stable and NAD. VSS. Pt to follow up with GI.

## 2016-12-24 NOTE — ED Notes (Signed)
Pt aware that urine sample is needed.  

## 2016-12-25 LAB — URINE CULTURE: Culture: NO GROWTH

## 2016-12-27 ENCOUNTER — Other Ambulatory Visit: Payer: 59

## 2016-12-27 ENCOUNTER — Encounter: Payer: 59 | Admitting: Family Medicine

## 2016-12-28 ENCOUNTER — Other Ambulatory Visit: Payer: Self-pay | Admitting: Family Medicine

## 2016-12-28 DIAGNOSIS — G4700 Insomnia, unspecified: Secondary | ICD-10-CM

## 2016-12-28 NOTE — Telephone Encounter (Signed)
Ok to refill??      Valium LRF 11/24/16

## 2016-12-28 NOTE — Telephone Encounter (Signed)
CPE scheduled for 01/06/17

## 2016-12-28 NOTE — Telephone Encounter (Signed)
okay

## 2017-01-03 ENCOUNTER — Ambulatory Visit: Payer: 59 | Admitting: Hematology and Oncology

## 2017-01-06 ENCOUNTER — Ambulatory Visit (INDEPENDENT_AMBULATORY_CARE_PROVIDER_SITE_OTHER): Payer: 59 | Admitting: Family Medicine

## 2017-01-06 ENCOUNTER — Encounter: Payer: Self-pay | Admitting: Family Medicine

## 2017-01-06 VITALS — BP 104/58 | HR 68 | Temp 98.1°F | Resp 18 | Ht 75.0 in | Wt 309.0 lb

## 2017-01-06 DIAGNOSIS — I10 Essential (primary) hypertension: Secondary | ICD-10-CM

## 2017-01-06 DIAGNOSIS — R1032 Left lower quadrant pain: Secondary | ICD-10-CM

## 2017-01-06 DIAGNOSIS — E1121 Type 2 diabetes mellitus with diabetic nephropathy: Secondary | ICD-10-CM

## 2017-01-06 DIAGNOSIS — Z0000 Encounter for general adult medical examination without abnormal findings: Secondary | ICD-10-CM | POA: Diagnosis not present

## 2017-01-06 DIAGNOSIS — E7800 Pure hypercholesterolemia, unspecified: Secondary | ICD-10-CM | POA: Diagnosis not present

## 2017-01-06 DIAGNOSIS — Z794 Long term (current) use of insulin: Secondary | ICD-10-CM | POA: Diagnosis not present

## 2017-01-06 MED ORDER — INSULIN PEN NEEDLE 31G X 5 MM MISC: 3 refills | Status: DC

## 2017-01-07 ENCOUNTER — Encounter: Payer: Self-pay | Admitting: Family Medicine

## 2017-01-07 LAB — PSA: PSA: 0.6 ng/mL (ref <=4.0)
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
## 2017-01-12 ENCOUNTER — Other Ambulatory Visit: Payer: Self-pay | Admitting: Family Medicine

## 2017-01-12 MED ORDER — LINACLOTIDE 145 MCG PO CAPS: 145.0000 ug | ORAL_CAPSULE | Freq: Every day | ORAL | 3 refills | Status: DC

## 2017-01-17 ENCOUNTER — Other Ambulatory Visit: Payer: Self-pay | Admitting: Family Medicine

## 2017-01-20 ENCOUNTER — Other Ambulatory Visit: Payer: Self-pay

## 2017-01-20 DIAGNOSIS — D472 Monoclonal gammopathy: Secondary | ICD-10-CM

## 2017-01-21 ENCOUNTER — Other Ambulatory Visit: Payer: 59

## 2017-01-27 ENCOUNTER — Ambulatory Visit: Payer: 59 | Admitting: Hematology and Oncology

## 2017-01-28 ENCOUNTER — Other Ambulatory Visit: Payer: Self-pay | Admitting: Family Medicine

## 2017-01-28 DIAGNOSIS — G4700 Insomnia, unspecified: Secondary | ICD-10-CM

## 2017-01-28 NOTE — Telephone Encounter (Signed)
ok 

## 2017-01-28 NOTE — Telephone Encounter (Signed)
Ok to refill both?? 

## 2017-01-28 NOTE — Telephone Encounter (Signed)
Ok but do not combine valium and [place]

## 2017-02-10 ENCOUNTER — Telehealth: Payer: Self-pay | Admitting: Family Medicine

## 2017-02-10 NOTE — Telephone Encounter (Signed)
Pt was given samples of Linzess in [place] and states that it worked well for a while however now it does not seem to do much and would like to know if we could call in the higher dose?  Also pt has been taking Ambien 10mg  for a long time and now it does not seem to be helping as much and was wanting to know if we can increase the dose of that or could he try something else?? He only sleeps 3-4 hours a night.    CB# [phone]

## 2017-02-11 MED ORDER — ZOLPIDEM TARTRATE ER 12.5 MG PO TBCR: 12.5000 mg | EXTENDED_RELEASE_TABLET | Freq: Every evening | ORAL | 0 refills | Status: DC | PRN

## 2017-02-11 MED ORDER — LINACLOTIDE 290 MCG PO CAPS: 290.0000 ug | ORAL_CAPSULE | Freq: Every day | ORAL | 5 refills | Status: DC

## 2017-02-11 NOTE — Telephone Encounter (Signed)
Could try linzess 290 mcg poqday.  Has become dependent on ambien so it is losing its effectiveness.  I would suggest trying ambien cr 12.5 mg poqhs prn.

## 2017-02-11 NOTE — Telephone Encounter (Signed)
Medication called/sent to requested pharmacy and pt's wife aware via vm 

## 2017-02-23 ENCOUNTER — Other Ambulatory Visit: Payer: Medicare Other

## 2017-03-02 ENCOUNTER — Ambulatory Visit: Payer: 59 | Admitting: Hematology and Oncology

## 2017-03-04 ENCOUNTER — Other Ambulatory Visit: Payer: Self-pay | Admitting: Neurological Surgery

## 2017-03-04 DIAGNOSIS — M546 Pain in thoracic spine: Secondary | ICD-10-CM

## 2017-03-08 ENCOUNTER — Telehealth: Payer: Self-pay | Admitting: Hematology and Oncology

## 2017-03-08 NOTE — Telephone Encounter (Signed)
Received call to r/s Sept appts to 10/9 and 10/16 per pt request

## 2017-03-09 ENCOUNTER — Other Ambulatory Visit: Payer: Medicare Other

## 2017-03-14 ENCOUNTER — Other Ambulatory Visit: Payer: Self-pay | Admitting: Family Medicine

## 2017-03-14 NOTE — Telephone Encounter (Signed)
ok 

## 2017-03-14 NOTE — Telephone Encounter (Signed)
Ok to refill 

## 2017-03-14 NOTE — Telephone Encounter (Signed)
Medication called to pharmacy. 

## 2017-03-15 ENCOUNTER — Ambulatory Visit: Payer: 59 | Admitting: Hematology and Oncology

## 2017-03-21 ENCOUNTER — Other Ambulatory Visit: Payer: Medicare Other

## 2017-03-26 ENCOUNTER — Ambulatory Visit
Admission: RE | Admit: 2017-03-26 | Discharge: 2017-03-26 | Disposition: A | Discharge status: Home / Self Care | Payer: 59 | Source: Ambulatory Visit | Attending: Neurological Surgery | Admitting: Neurological Surgery

## 2017-03-26 DIAGNOSIS — M546 Pain in thoracic spine: Secondary | ICD-10-CM

## 2017-03-27 ENCOUNTER — Other Ambulatory Visit: Payer: Self-pay | Admitting: Family Medicine

## 2017-04-06 ENCOUNTER — Other Ambulatory Visit: Payer: Self-pay

## 2017-04-06 MED ORDER — GLUCOSE BLOOD VI STRP: ORAL_STRIP | 4 refills | Status: DC

## 2017-04-06 NOTE — Telephone Encounter (Signed)
rx filled per protocol  

## 2017-04-08 ENCOUNTER — Other Ambulatory Visit: Payer: Self-pay

## 2017-04-11 ENCOUNTER — Other Ambulatory Visit: Payer: Self-pay | Admitting: Family Medicine

## 2017-04-11 NOTE — Telephone Encounter (Signed)
ok 

## 2017-04-11 NOTE — Telephone Encounter (Signed)
Valium called in to pharmacy

## 2017-04-11 NOTE — Telephone Encounter (Signed)
Last OV 7/5 LAST REFILL 9/10 Ok to refill valium?

## 2017-04-12 ENCOUNTER — Other Ambulatory Visit: Payer: Medicare Other

## 2017-04-19 ENCOUNTER — Ambulatory Visit: Payer: 59 | Admitting: Hematology and Oncology

## 2017-05-05 ENCOUNTER — Other Ambulatory Visit: Payer: Self-pay | Admitting: Family Medicine

## 2017-05-05 ENCOUNTER — Other Ambulatory Visit: Payer: Medicare Other

## 2017-05-06 NOTE — Telephone Encounter (Signed)
ok 

## 2017-05-06 NOTE — Telephone Encounter (Signed)
Ok to refill 

## 2017-05-06 NOTE — Telephone Encounter (Signed)
Medication called to pharmacy. 

## 2017-05-10 ENCOUNTER — Ambulatory Visit: Payer: 59 | Admitting: Hematology and Oncology

## 2017-05-31 ENCOUNTER — Other Ambulatory Visit: Payer: 59

## 2017-06-03 ENCOUNTER — Other Ambulatory Visit: Payer: Self-pay | Admitting: Family Medicine

## 2017-06-03 NOTE — Telephone Encounter (Signed)
Okay to refill? 

## 2017-06-03 NOTE — Telephone Encounter (Signed)
Ok to refill??  Last office visit 01/06/2017.  Last refill 04/11/2017.

## 2017-06-03 NOTE — Telephone Encounter (Signed)
Medication called to pharmacy. 

## 2017-06-06 ENCOUNTER — Telehealth: Payer: Self-pay | Admitting: Hematology and Oncology

## 2017-06-06 NOTE — Telephone Encounter (Signed)
Patient called into reschedule appointment

## 2017-06-07 ENCOUNTER — Ambulatory Visit: Payer: Medicare Other | Admitting: Hematology and Oncology

## 2017-06-09 ENCOUNTER — Ambulatory Visit (INDEPENDENT_AMBULATORY_CARE_PROVIDER_SITE_OTHER): Payer: 59 | Admitting: Family Medicine

## 2017-06-09 ENCOUNTER — Other Ambulatory Visit: Payer: Self-pay | Admitting: Family Medicine

## 2017-06-09 ENCOUNTER — Encounter: Payer: Self-pay | Admitting: Family Medicine

## 2017-06-09 VITALS — BP 90/60 | HR 64 | Temp 97.9°F | Resp 14 | Ht 75.0 in | Wt 306.0 lb

## 2017-06-09 DIAGNOSIS — G8929 Other chronic pain: Secondary | ICD-10-CM | POA: Diagnosis not present

## 2017-06-09 DIAGNOSIS — R1084 Generalized abdominal pain: Secondary | ICD-10-CM | POA: Diagnosis not present
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
## 2017-06-16 ENCOUNTER — Telehealth: Payer: Self-pay | Admitting: Family Medicine

## 2017-06-16 MED ORDER — METOCLOPRAMIDE HCL 10 MG PO TABS: 10.0000 mg | ORAL_TABLET | Freq: Three times a day (TID) | ORAL | 0 refills | Status: DC

## 2017-06-16 NOTE — Telephone Encounter (Signed)
Medication called/sent to requested pharmacy - pt aware via vm

## 2017-06-16 NOTE — Telephone Encounter (Signed)
Pt taking Amitza - no help. Says you were going to try Reglan. Please advise.

## 2017-06-16 NOTE — Telephone Encounter (Signed)
Dc amitiza, try reglan 10 mg poqachs and recheck in 1 week.

## 2017-06-30 ENCOUNTER — Other Ambulatory Visit: Payer: Self-pay | Admitting: Family Medicine

## 2017-06-30 NOTE — Telephone Encounter (Signed)
Ok to refill??  Last office visit 06/09/2017.  Last refill 06/03/2017.

## 2017-06-30 NOTE — Telephone Encounter (Signed)
Ok to refill??  Last office visit 06/09/2017.  Last refill 05/16/2017, #1 refill.

## 2017-07-08 ENCOUNTER — Other Ambulatory Visit: Payer: Medicare Other

## 2017-07-11 ENCOUNTER — Other Ambulatory Visit: Payer: Self-pay | Admitting: Family Medicine

## 2017-07-13 ENCOUNTER — Other Ambulatory Visit: Payer: Self-pay | Admitting: Family Medicine

## 2017-07-19 ENCOUNTER — Ambulatory Visit: Payer: Medicare Other | Admitting: Hematology and Oncology

## 2017-07-19 NOTE — Assessment & Plan Note (Deleted)
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 Serum creatinine is 1.4 (most likely due to type 2 diabetes) Serum calcium 9 Hemoglobin 15  Kappa: Lambda ratio: 1.8  Patient did not have any additional lab testing since that time. He will undergo lab tests today and we will follow-up on the results and call him with the test results.  We would like to see him at least once a year for recheck of his MGUS blood work

## 2017-07-28 ENCOUNTER — Other Ambulatory Visit: Payer: Self-pay | Admitting: Family Medicine

## 2017-07-28 NOTE — Telephone Encounter (Signed)
Ok to refill??  Last office visit 06/11/2017.  Last refill 06/30/2017, #1 refill.   Of note, patient had filled today and any refills will be put on file.

## 2017-07-28 NOTE — Telephone Encounter (Signed)
Ok to refill??  Last office visit 06/09/2017.  Last refill 06/30/2017.

## 2017-08-15 ENCOUNTER — Other Ambulatory Visit: Payer: Self-pay | Admitting: Family Medicine

## 2017-08-26 ENCOUNTER — Other Ambulatory Visit: Payer: Self-pay | Admitting: Family Medicine

## 2017-08-26 NOTE — Telephone Encounter (Signed)
Requesting refill    Valium  LOV: 06/09/17  LRF:  07/29/17

## 2017-09-27 ENCOUNTER — Other Ambulatory Visit: Payer: Self-pay | Admitting: Family Medicine

## 2017-09-27 NOTE — Telephone Encounter (Signed)
Ok to refill??  Last office visit 06/09/2017.  Last refill 08/26/2017.

## 2017-10-06 ENCOUNTER — Other Ambulatory Visit: Payer: Self-pay | Admitting: Family Medicine

## 2017-10-19 ENCOUNTER — Other Ambulatory Visit: Payer: Self-pay | Admitting: Family Medicine

## 2017-10-19 NOTE — Telephone Encounter (Signed)
Requesting refill    Ambien  LOV: 06/09/17  LRF:  07/28/17

## 2017-10-24 ENCOUNTER — Other Ambulatory Visit: Payer: Self-pay | Admitting: Family Medicine

## 2017-10-24 NOTE — Telephone Encounter (Signed)
Refill on ambien to [facility]

## 2017-10-25 MED ORDER — ZOLPIDEM TARTRATE ER 12.5 MG PO TBCR: 12.5000 mg | EXTENDED_RELEASE_TABLET | Freq: Every evening | ORAL | 2 refills | Status: DC | PRN

## 2017-10-25 NOTE — Telephone Encounter (Signed)
Please resend - needed different pharm

## 2017-10-26 ENCOUNTER — Other Ambulatory Visit: Payer: Self-pay | Admitting: Family Medicine

## 2017-10-26 NOTE — Telephone Encounter (Signed)
Pt needs valium sent to [facility].

## 2017-10-26 NOTE — Telephone Encounter (Signed)
Requesting refill    Valium  LOV: 06/09/17  LRF:   09/27/17

## 2017-10-27 MED ORDER — DIAZEPAM 5 MG PO TABS: ORAL_TABLET | ORAL | 0 refills | Status: DC

## 2017-11-24 ENCOUNTER — Other Ambulatory Visit: Payer: Self-pay | Admitting: Family Medicine

## 2017-11-24 NOTE — Telephone Encounter (Signed)
Ok to refill??  Last office visit 06/09/2017.  Last refill 10/27/2017.

## 2017-12-02 ENCOUNTER — Telehealth: Payer: Self-pay | Admitting: Family Medicine

## 2017-12-02 MED ORDER — PRAVASTATIN SODIUM 40 MG PO TABS: 40.0000 mg | ORAL_TABLET | Freq: Every day | ORAL | 1 refills | Status: DC

## 2017-12-02 MED ORDER — POTASSIUM CHLORIDE CRYS ER 20 MEQ PO TBCR: 20.0000 meq | EXTENDED_RELEASE_TABLET | Freq: Two times a day (BID) | ORAL | 1 refills | Status: DC

## 2017-12-02 MED ORDER — FUROSEMIDE 40 MG PO TABS: 40.0000 mg | ORAL_TABLET | Freq: Two times a day (BID) | ORAL | 1 refills | Status: DC

## 2017-12-02 NOTE — Telephone Encounter (Signed)
Medication called/sent to requested pharmacy  

## 2017-12-02 NOTE — Telephone Encounter (Signed)
Refill on potassium, furosemide, pravastatin to [facility].

## 2017-12-12 ENCOUNTER — Telehealth: Payer: Self-pay | Admitting: Family Medicine

## 2017-12-12 MED ORDER — METOCLOPRAMIDE HCL 10 MG PO TABS: ORAL_TABLET | ORAL | 3 refills | Status: DC

## 2017-12-12 NOTE — Telephone Encounter (Signed)
Medication called/sent to requested pharmacy  

## 2017-12-12 NOTE — Telephone Encounter (Signed)
Refill on reglan to [facility].

## 2017-12-21 ENCOUNTER — Other Ambulatory Visit: Payer: Self-pay | Admitting: Family Medicine

## 2017-12-21 NOTE — Telephone Encounter (Signed)
Ok to refill??  Last office visit 06/09/2017.  Last refill 11/24/2017.

## 2017-12-29 ENCOUNTER — Other Ambulatory Visit: Payer: Self-pay | Admitting: Family Medicine

## 2017-12-29 DIAGNOSIS — E7800 Pure hypercholesterolemia, unspecified: Secondary | ICD-10-CM

## 2017-12-29 DIAGNOSIS — Z794 Long term (current) use of insulin: Principal | ICD-10-CM

## 2017-12-29 DIAGNOSIS — E1121 Type 2 diabetes mellitus with diabetic nephropathy: Secondary | ICD-10-CM

## 2017-12-29 DIAGNOSIS — Z79899 Other long term (current) drug therapy: Secondary | ICD-10-CM

## 2017-12-29 DIAGNOSIS — I10 Essential (primary) hypertension: Secondary | ICD-10-CM

## 2018-01-09 ENCOUNTER — Other Ambulatory Visit: Payer: 59

## 2018-01-09 DIAGNOSIS — Z79899 Other long term (current) drug therapy: Secondary | ICD-10-CM

## 2018-01-09 DIAGNOSIS — I10 Essential (primary) hypertension: Secondary | ICD-10-CM

## 2018-01-09 DIAGNOSIS — Z794 Long term (current) use of insulin: Principal | ICD-10-CM

## 2018-01-09 DIAGNOSIS — E1121 Type 2 diabetes mellitus with diabetic nephropathy: Secondary | ICD-10-CM

## 2018-01-09 DIAGNOSIS — E7800 Pure hypercholesterolemia, unspecified: Secondary | ICD-10-CM

## 2018-01-10 LAB — CBC WITH DIFFERENTIAL/PLATELET
Basophils Absolute: 56 {cells}/uL (ref 0–200)
Basophils Relative: 0.6 %
Eosinophils Absolute: 150 {cells}/uL (ref 15–500)
Eosinophils Relative: 1.6 %
HCT: 46.1 % (ref 38.5–50.0)
Hemoglobin: 15.3 g/dL (ref 13.2–17.1)
Lymphs Abs: 2124 {cells}/uL (ref 850–3900)
MCH: 29.8 pg (ref 27.0–33.0)
MCHC: 33.2 g/dL (ref 32.0–36.0)
MCV: 89.7 fL (ref 80.0–100.0)
MPV: 9.8 fL (ref 7.5–12.5)
Monocytes Relative: 7.2 %
Neutro Abs: 6392 {cells}/uL (ref 1500–7800)
Neutrophils Relative %: 68 %
Platelets: 274 Thousand/uL (ref 140–400)
RBC: 5.14 Million/uL (ref 4.20–5.80)
RDW: 12.6 % (ref 11.0–15.0)
Total Lymphocyte: 22.6 %
WBC mixed population: 677 {cells}/uL (ref 200–950)
WBC: 9.4 Thousand/uL (ref 3.8–10.8)

## 2018-01-10 LAB — COMPREHENSIVE METABOLIC PANEL
AG Ratio: 1.9 (calc) (ref 1.0–2.5)
ALBUMIN MSPROF: 4.4 g/dL (ref 3.6–5.1)
ALT: 16 U/L (ref 9–46)
AST: 14 U/L (ref 10–35)
Alkaline phosphatase (APISO): 56 U/L (ref 40–115)
BUN / CREAT RATIO: 13 (calc) (ref 6–22)
BUN: 17 mg/dL (ref 7–25)
CO2: 28 mmol/L (ref 20–32)
Calcium: 9.2 mg/dL (ref 8.6–10.3)
Chloride: 104 mmol/L (ref 98–110)
Creat: 1.31 mg/dL — ABNORMAL HIGH (ref 0.70–1.25)
GLOBULIN: 2.3 g/dL (ref 1.9–3.7)
GLUCOSE: 164 mg/dL — AB (ref 65–99)
POTASSIUM: 3.7 mmol/L (ref 3.5–5.3)
Sodium: 146 mmol/L (ref 135–146)
Total Bilirubin: 0.6 mg/dL (ref 0.2–1.2)
Total Protein: 6.7 g/dL (ref 6.1–8.1)

## 2018-01-10 LAB — LIPID PANEL
Cholesterol: 128 mg/dL (ref <200)
HDL: 45 mg/dL (ref >40)
LDL CHOLESTEROL (CALC): 63 mg/dL
NON-HDL CHOLESTEROL (CALC): 83 mg/dL (ref <130)
TRIGLYCERIDES: 114 mg/dL (ref <150)
Total CHOL/HDL Ratio: 2.8 (calc) (ref <5.0)

## 2018-01-10 LAB — HEMOGLOBIN A1C
EAG (MMOL/L): 7 (calc)
HEMOGLOBIN A1C: 6 %{Hb} — AB (ref <5.7)
MEAN PLASMA GLUCOSE: 126 (calc)

## 2018-01-12 ENCOUNTER — Ambulatory Visit (INDEPENDENT_AMBULATORY_CARE_PROVIDER_SITE_OTHER): Payer: 59 | Admitting: Family Medicine

## 2018-01-12 ENCOUNTER — Encounter: Payer: Self-pay | Admitting: Family Medicine

## 2018-01-12 VITALS — BP 100/56 | HR 74 | Temp 98.1°F | Resp 22 | Ht 75.0 in | Wt 288.0 lb

## 2018-01-12 DIAGNOSIS — R5382 Chronic fatigue, unspecified: Secondary | ICD-10-CM

## 2018-01-12 DIAGNOSIS — R0609 Other forms of dyspnea: Secondary | ICD-10-CM

## 2018-01-12 DIAGNOSIS — Z125 Encounter for screening for malignant neoplasm of prostate: Secondary | ICD-10-CM | POA: Diagnosis not present

## 2018-01-12 DIAGNOSIS — D472 Monoclonal gammopathy: Secondary | ICD-10-CM

## 2018-01-12 DIAGNOSIS — R634 Abnormal weight loss: Secondary | ICD-10-CM

## 2018-01-12 NOTE — Progress Notes (Signed)
Subjective:    Patient ID: Dennis Vasquez, male    DOB: [birth year], 72 y.o.   MRN: [id number]  HPI   Patient is here today for complete physical exam however several medical concerns occupy the visit.  First, the patient is extremely winded just sitting on the exam table.  He states that he can walk just a few feet and become extremely winded.  This is been going on for several months.  Recently it has gotten worse.  He denies any chest pain.  Patient had a stress test in 2015 which revealed no evidence of ischemia.  He had an echocardiogram performed in 2015 which showed normal ejection fraction.  He has been diagnosed with COPD in the past and is not currently on any kind of maintenance medication.  He does have rescue nebulizers but seldom uses them.  Second the patient reports feeling extremely weak and tired.  He has no energy.  He feels dizzy upon standing.  His blood pressure is extremely low at 100/56 given his previous history.  He is on numerous medications to control hypertension and questions if he needs the mall.  He believes his blood pressure has declined dramatically due to his recent weight loss.  Over the last 6 months, due to a combination of bloating and early satiety, the patient is only eating 1 meal a day despite taking Reglan.  As result he is lost 21 pounds unintentionally.  Patient also has a past medical history of MGUS has not followed up with oncology in 2 years.  Last colonoscopy was in 2017 was significant for 5 polyps.  It was recommended a repeat colonoscopy in 3 years.  He is due for prostate cancer screening.  Most recent lab work as listed below and is unremarkable: Appointment on 01/09/2018  Component Date Value Ref Range Status  . Cholesterol 01/09/2018 128  <200 mg/dL Final  . HDL 01/09/2018 45  >40 mg/dL Final  . Triglycerides 01/09/2018 114  <150 mg/dL Final  . LDL Cholesterol (Calc) 01/09/2018 63  mg/dL (calc) Final   Comment: Reference range:  <100 . Desirable range <100 mg/dL for primary prevention;   <70 mg/dL for patients with CHD or diabetic patients  with > or = 2 CHD risk factors. Marland Kitchen LDL-C is now calculated using the Martin-Hopkins  calculation, which is a validated novel method providing  better accuracy than the Friedewald equation in the  estimation of LDL-C.  Cresenciano Genre et al. Annamaria Helling. 2197;588(32): 2061-2068  (http://education.[facility].com/faq/FAQ164)   . Total CHOL/HDL Ratio 01/09/2018 2.8  <5.0 (calc) Final  . Non-HDL Cholesterol (Calc) 01/09/2018 83  <130 mg/dL (calc) Final   Comment: For patients with diabetes plus 1 major ASCVD risk  factor, treating to a non-HDL-C goal of <100 mg/dL  (LDL-C of <70 mg/dL) is considered a therapeutic  option.   . Hgb A1c MFr Bld 01/09/2018 6.0* <5.7 % of total Hgb Final   Comment: For someone without known diabetes, a hemoglobin  A1c value between 5.7% and 6.4% is consistent with prediabetes and should be confirmed with a  follow-up test. . For someone with known diabetes, a value <7% indicates that their diabetes is well controlled. A1c targets should be individualized based on duration of diabetes, age, comorbid conditions, and other considerations. . This assay result is consistent with an increased risk of diabetes. . Currently, no consensus exists regarding use of hemoglobin A1c for diagnosis of diabetes for children. .   . Mean Plasma Glucose 01/09/2018  126  (calc) Final  . eAG (mmol/L) 01/09/2018 7.0  (calc) Final  . Glucose, Bld 01/09/2018 164* 65 - 99 mg/dL Final   Comment: .            Fasting reference interval . For someone without known diabetes, a glucose value >125 mg/dL indicates that they may have diabetes and this should be confirmed with a follow-up test. .   . BUN 01/09/2018 17  7 - 25 mg/dL Final  . Creat 01/09/2018 1.31* 0.70 - 1.25 mg/dL Final   Comment: For patients >65 years of age, the reference limit for Creatinine is  approximately 13% higher for people identified as African-American. .   Havery Moros Ratio 01/09/2018 13  6 - 22 (calc) Final  . Sodium 01/09/2018 146  135 - 146 mmol/L Final  . Potassium 01/09/2018 3.7  3.5 - 5.3 mmol/L Final  . Chloride 01/09/2018 104  98 - 110 mmol/L Final  . CO2 01/09/2018 28  20 - 32 mmol/L Final  . Calcium 01/09/2018 9.2  8.6 - 10.3 mg/dL Final  . Total Protein 01/09/2018 6.7  6.1 - 8.1 g/dL Final  . Albumin 01/09/2018 4.4  3.6 - 5.1 g/dL Final  . Globulin 01/09/2018 2.3  1.9 - 3.7 g/dL (calc) Final  . AG Ratio 01/09/2018 1.9  1.0 - 2.5 (calc) Final  . Total Bilirubin 01/09/2018 0.6  0.2 - 1.2 mg/dL Final  . Alkaline phosphatase (APISO) 01/09/2018 56  40 - 115 U/L Final  . AST 01/09/2018 14  10 - 35 U/L Final  . ALT 01/09/2018 16  9 - 46 U/L Final  . WBC 01/09/2018 9.4  3.8 - 10.8 Thousand/uL Final  . RBC 01/09/2018 5.14  4.20 - 5.80 Million/uL Final  . Hemoglobin 01/09/2018 15.3  13.2 - 17.1 g/dL Final  . HCT 01/09/2018 46.1  38.5 - 50.0 % Final  . MCV 01/09/2018 89.7  80.0 - 100.0 fL Final  . MCH 01/09/2018 29.8  27.0 - 33.0 pg Final  . MCHC 01/09/2018 33.2  32.0 - 36.0 g/dL Final  . RDW 01/09/2018 12.6  11.0 - 15.0 % Final  . Platelets 01/09/2018 274  140 - 400 Thousand/uL Final  . MPV 01/09/2018 9.8  7.5 - 12.5 fL Final  . Neutro Abs 01/09/2018 6,392  1,500 - 7,800 cells/uL Final  . Lymphs Abs 01/09/2018 2,124  850 - 3,900 cells/uL Final  . WBC mixed population 01/09/2018 677  200 - 950 cells/uL Final  . Eosinophils Absolute 01/09/2018 150  15 - 500 cells/uL Final  . Basophils Absolute 01/09/2018 56  0 - 200 cells/uL Final  . Neutrophils Relative % 01/09/2018 68  % Final  . Total Lymphocyte 01/09/2018 22.6  % Final  . Monocytes Relative 01/09/2018 7.2  % Final  . Eosinophils Relative 01/09/2018 1.6  % Final  . Basophils Relative 01/09/2018 0.6  % Final    Pulmonary function tests show an FEV1 of 2.25 L which is 53% of predicted, and FVC of 3.39 L  which is 64% of predicted and FEV1 to FVC ratio of 66%.  This is consistent with moderately severe obstruction/stage III obstructive lung disease. Past Medical History:  Diagnosis Date  . Allergic rhinitis   . Asthma   . BPH (benign prostatic hyperplasia)   . 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 COPD (chronic obstructive pulmonary disease) ([place])   . Coronary artery disease 2006   Non obstructive on cath 2006;  Myoview  07/21/11: EF of 61%, and small partially reversible inferior and apical defect consistent with inferior and apical thinning and mild inferior ischemia.  LHC and RHC 08/13/11: PCWP 17, CO2 7.4, CI 2.8, proximal LAD 40-50%, mid RCA 50%, distal RCA 50%, EF 55-65%, essentially normal intracardiac hemodynamics  . Diabetes mellitus    Type 2 IDDM x 10 yrs  . Elevated triglycerides with high cholesterol   . GERD (gastroesophageal reflux disease)   . Heart murmur   . History of kidney stones   . HNP (herniated nucleus pulposus), lumbar   . Hx of colonic polyps   . Hyperlipidemia   . Hypertension   . Myocardial infarction ([place])   . OSA (obstructive sleep apnea) 06/27/2012  . Osteoarthritis    left knee  . Pneumonia ~2001   out patient  . Pulmonary nodule    58m, stable Dec 2005 through Dec 2006 and May 2009, no further follow-up  . Shortness of breath dyspnea    walking  . Sleep apnea    mild no cpap   Past Surgical History:  Procedure Laterality Date  . BACK SURGERY  08/2011   lumbar lamscrews and rods  . CARDIAC CATHETERIZATION  08/2011   Dr CBurt Knack . CARDIOVASCULAR STRESS TEST  2003?  .Marland Kitchencervical dis repair  1997  . LUMBAR LAMINECTOMY/DECOMPRESSION MICRODISCECTOMY  11/10/2011   Procedure: LUMBAR LAMINECTOMY/DECOMPRESSION MICRODISCECTOMY 1 LEVEL;  Surgeon: DEustace Moore MD;  [facility];  Service: Neurosurgery;  Laterality: Right;  redo lumbar three - four  . MAXIMUM ACCESS (MAS)POSTERIOR LUMBAR INTERBODY FUSION (PLIF) 1 LEVEL N/A 06/21/2014    Procedure: FOR MAXIMUM ACCESS (MAS) POSTERIOR LUMBAR INTERBODY FUSION LUMBAR THREE TO FOUR (PLIF) 1 LEVEL;  Surgeon: DEustace Moore MD;  [facility];  Service: Neurosurgery;  Laterality: N/A;  . pneumonia  2001   out pt  . SPINE SURGERY    . TONSILLECTOMY     Current Outpatient Medications on File Prior to Visit  Medication Sig Dispense Refill  . amLODipine-benazepril (LOTREL) 10-40 MG capsule TAKE 1 CAPSULE BY MOUTH ONCE DAILY 90 capsule 3  . aspirin 325 MG tablet Take 325 mg by mouth daily.    . blood glucose meter kit and supplies KIT Dispense based on patient and insurance preference. Check BS QID E11.9 1 each 0  . Blood Glucose Monitoring Suppl (ONE TOUCH ULTRA SYSTEM KIT) w/Device KIT USE TO TEST BLOOD SUGAR THREE TIMES DAILY- PLEASE DISPENSE ONE TOUCH ULTRA II- DX: E11.65. 1 each 1  . BYSTOLIC 5 MG tablet TAKE 1 TABLET BY MOUTH ONCE DAILY 90 tablet 3  . cloNIDine (CATAPRES) 0.3 MG tablet TAKE 1 TABLET BY MOUTH 3 TIMES DAILY 270 tablet 3  . diazepam (VALIUM) 5 MG tablet TAKE 1 TABLET BY MOUTH EVERY 6 HOURS AS NEEDED FOR ANXIETY. DO NOT TAKE WITH AMBIEN* 60 tablet 0  . furosemide (LASIX) 40 MG tablet Take 1 tablet (40 mg total) by mouth 2 (two) times daily. 180 tablet 1  . glucose blood (ONE TOUCH ULTRA TEST) test strip USE TO TEST BLOOD SUGAR  THREE TIMES DAILY 300 each 4  . glucose blood test strip Use as instructed 300 each 3  . hydrALAZINE (APRESOLINE) 100 MG tablet TAKE 1 TABLET BY MOUTH 3 TIMES DAILY 270 tablet 3  . insulin lispro (HUMALOG KWIKPEN) 100 UNIT/ML KiwkPen INJECT MAX 80 UNITS EVERY  MORNING , 60 UNITS EVERY  NOON AND 25 UNITS EVERY  NIGHT AT BEDTIME PER  SLIDING SCALE 150 mL 11  . Insulin Pen Needle 31G X 5 MM MISC Uses qid with insulin 300 each 3  . LANTUS SOLOSTAR 100 UNIT/ML Solostar Pen INJECT SUBCUTANEOUSLY 75  UNITS (0.75ML) EVERY  MORNING 75 mL 3  . loratadine (CLARITIN) 10 MG tablet TAKE 1 TABLET BY MOUTH ONCE DAILY 90 tablet 1  . metoCLOPramide  (REGLAN) 10 MG tablet TAKE 1 TABLET BY MOUTH 3 TIMES DAILY BEFORE MEALS 90 tablet 3  . Multiple Vitamin (MULTIVITAMIN) tablet Take 1 tablet by mouth daily.      . Omega-3 Fatty Acids (FISH OIL) 1200 MG CAPS Take 1,200 mg by mouth 2 (two) times daily.    Marland Kitchen omeprazole (PRILOSEC) 20 MG capsule Take 1 capsule (20 mg total) by mouth daily. (Patient taking differently: Take 20 mg by mouth daily as needed (acid reflux). ) 90 capsule 4  . ONE TOUCH ULTRA TEST test strip USE AS DIRECTED TO TEST BLOOD SUGAR 3 TIMES DAILY 300 each 5  . polyethylene glycol (MIRALAX / GLYCOLAX) packet Take 17 g by mouth daily. 14 each 0  . potassium chloride SA (K-DUR,KLOR-CON) 20 MEQ tablet Take 1 tablet (20 mEq total) by mouth 2 (two) times daily. 180 tablet 1  . pravastatin (PRAVACHOL) 40 MG tablet Take 1 tablet (40 mg total) by mouth daily. 90 tablet 1  . zolpidem (AMBIEN CR) 12.5 MG CR tablet Take 1 tablet (12.5 mg total) by mouth at bedtime as needed. for sleep 30 tablet 2  . [DISCONTINUED] clonazePAM (KLONOPIN) 0.5 MG tablet Take 0.5-1 mg by mouth Nightly.      . [DISCONTINUED] potassium chloride (KLOR-CON) 20 MEQ packet Take 20 mEq by mouth daily.       No current facility-administered medications on file prior to visit.    Allergies  Allergen Reactions  . Duloxetine Other (See Comments)    "blood was on fire in body"  . Elavil [Amitriptyline Hcl] Other (See Comments)    unknown  . Lyrica [Pregabalin] Swelling   Social History   Socioeconomic History  . Marital status: Married    Spouse name: [name]  . Number of children: 1  . Years of education: 22  . Highest education level: Not on file  Occupational History  . 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 Occupation: short term disability  Social Needs  . Financial resource strain: Not on file  . Food insecurity:    Worry: Not on file    Inability: Not on  file  . Transportation needs:    Medical: Not on file    Non-medical: Not on file  Tobacco Use  . Smoking status: Former Smoker    Packs/day: 2.00    Years: 40.00    Pack years: 80.00    Types: Cigarettes    Last attempt to quit: 07/06/2003    Years since quitting: 14.5  . Smokeless tobacco: Never Used  Substance and Sexual Activity  . Alcohol use: No    Alcohol/week: 0.0 oz  . Drug use: No  . Sexual activity: Yes    Comment: Married to [place].  Son has autoimune disease.  Lifestyle  . Physical activity:    Days per week: Not on file    Minutes per session: Not on file  . Stress: Not on file  Relationships  . Social connections:    Talks on phone: Not on  file    Gets together: Not on file    Attends religious service: Not on file    Active member of club or organization: Not on file    Attends meetings of clubs or organizations: Not on file    Relationship status: Not on file  . Intimate partner violence:    Fear of current or ex partner: Not on file    Emotionally abused: Not on file    Physically abused: Not on file    Forced sexual activity: Not on file  Other Topics Concern  . Not on file  Social History Narrative   No asbestos exposure, no silica exposure.   Worked at [employer] and was exposed to [employer].   Caffeine use: Drinks coffee rarely   Drinks diet soda- 4 times per week   Family History  Problem Relation Age of Onset  . Prostate cancer Father   . Aneurysm Father        AAA  . Heart disease Neg Hx        Negative FH for CAD  . Hypertension Neg Hx   . Diabetes Neg Hx   . Anesthesia problems Neg Hx   . Neuropathy Neg Hx   . Colon cancer Neg Hx       Review of Systems  All other systems reviewed and are negative.      Objective:   Physical Exam  Constitutional: He is oriented to person, place, and time. He appears well-developed and well-nourished. No distress.  HENT:  Head: Normocephalic and atraumatic.  Right Ear: External ear normal.   Left Ear: External ear normal.  Nose: Nose normal.  Mouth/Throat: Oropharynx is clear and moist. No oropharyngeal exudate.  Eyes: Pupils are equal, round, and reactive to light. Conjunctivae and EOM are normal. Right eye exhibits no discharge. Left eye exhibits no discharge. No scleral icterus.  Neck: Normal range of motion. Neck supple. No JVD present. No tracheal deviation present. No thyromegaly present.  Cardiovascular: Normal rate, regular rhythm, normal heart sounds and intact distal pulses. Exam reveals no gallop and no friction rub.  No murmur heard. Pulmonary/Chest: Effort normal and breath sounds normal. No stridor. No respiratory distress. He has no wheezes. He has no rales. He exhibits no tenderness.  Abdominal: Soft. Bowel sounds are normal. He exhibits no distension and no mass. There is no tenderness. There is no rebound and no guarding.  Musculoskeletal: Normal range of motion. He exhibits no edema, tenderness or deformity.  Lymphadenopathy:    He has no cervical adenopathy.  Neurological: He is alert and oriented to person, place, and time. He has normal reflexes. No cranial nerve deficit. He exhibits normal muscle tone. Coordination normal.  Skin: Skin is warm. No rash noted. He is not diaphoretic. No erythema. No pallor.  Psychiatric: He has a normal mood and affect. His behavior is normal. Judgment and thought content normal.  Vitals reviewed.         Assessment & Plan:  Chronic fatigue - Plan: Protein electrophoresis, serum, TSH, Vitamin B12  Prostate cancer screening - Plan: PSA  MGUS (monoclonal gammopathy of unknown significance) - Plan: Protein electrophoresis, serum  Dyspnea on exertion  Weight loss  Pulmonary function test showed significant obstructive lung disease.  Recommend starting Trelegy 1 inhalation a day and recheck in 3 weeks to see if patient's dyspnea on exertion and stamina from a respiratory standpoint have improved.  If not at that time, I  would recommend an echocardiogram to evaluate  for cardiomyopathy, possibly a stress test to evaluate for coronary ischemia.  I would also consider referral to pulmonology at that point for high-resolution CT scan of the lung to evaluate for interstitial lung disease.  I will repeat a serum protein electrophoresis to evaluate his MGUS.  I will screen for prostate cancer with PSA.  Due to his weight loss, I will check a TSH.  Given his chronic fatigue I will check a TSH and vitamin B12.  If these are normal, we may need to check a free testosterone level.  Reassess the patient in 3 weeks.  At that point if his breathing has improved, I would refer the patient to GI due to his early satiety and weight loss for possible EGD

## 2018-01-13 ENCOUNTER — Encounter (INDEPENDENT_AMBULATORY_CARE_PROVIDER_SITE_OTHER): Payer: Self-pay

## 2018-01-16 LAB — TSH: TSH: 1.26 mIU/L (ref 0.40–4.50)

## 2018-01-16 LAB — PROTEIN ELECTROPHORESIS, SERUM
Abnormal Protein Band1: 0.4 g/dL — ABNORMAL HIGH
Albumin ELP: 4 g/dL (ref 3.8–4.8)
Alpha 1: 0.3 g/dL (ref 0.2–0.3)
Alpha 2: 0.7 g/dL (ref 0.5–0.9)
BETA 2: 0.3 g/dL (ref 0.2–0.5)
Beta Globulin: 0.4 g/dL (ref 0.4–0.6)
GAMMA GLOBULIN: 0.8 g/dL (ref 0.8–1.7)
Total Protein: 6.4 g/dL (ref 6.1–8.1)

## 2018-01-16 LAB — VITAMIN B12: Vitamin B-12: 788 pg/mL (ref 200–1100)

## 2018-01-16 LAB — PSA: PSA: 0.6 ng/mL (ref <=4.0)

## 2018-01-23 ENCOUNTER — Other Ambulatory Visit: Payer: Self-pay | Admitting: Family Medicine

## 2018-01-23 NOTE — Telephone Encounter (Signed)
Ok to refill??  Last office visit 01/12/2018.  Last refill 10/25/2017, #2 refills.

## 2018-01-27 ENCOUNTER — Other Ambulatory Visit: Payer: Self-pay | Admitting: Family Medicine

## 2018-01-27 MED ORDER — LORATADINE 10 MG PO TABS: 10.0000 mg | ORAL_TABLET | Freq: Every day | ORAL | 3 refills | Status: DC

## 2018-02-02 ENCOUNTER — Ambulatory Visit (INDEPENDENT_AMBULATORY_CARE_PROVIDER_SITE_OTHER): Payer: 59 | Admitting: Family Medicine

## 2018-02-02 ENCOUNTER — Encounter: Payer: Self-pay | Admitting: Family Medicine

## 2018-02-02 VITALS — BP 132/80 | HR 100 | Temp 97.8°F | Resp 18 | Ht 75.0 in | Wt 285.0 lb

## 2018-02-02 DIAGNOSIS — R5382 Chronic fatigue, unspecified: Secondary | ICD-10-CM

## 2018-02-02 DIAGNOSIS — R6881 Early satiety: Secondary | ICD-10-CM

## 2018-02-02 DIAGNOSIS — R634 Abnormal weight loss: Secondary | ICD-10-CM | POA: Diagnosis not present

## 2018-02-02 DIAGNOSIS — K5909 Other constipation: Secondary | ICD-10-CM

## 2018-02-02 DIAGNOSIS — R0602 Shortness of breath: Secondary | ICD-10-CM
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
## 2018-02-03 MED ORDER — FLUTICASONE-UMECLIDIN-VILANT 100-62.5-25 MCG/INH IN AEPB: 1.0000 | INHALATION_SPRAY | Freq: Every day | RESPIRATORY_TRACT | 11 refills | Status: DC

## 2018-02-04 LAB — TESTOSTERONE TOTAL,FREE,BIO, MALES
Albumin: 4.5 g/dL (ref 3.6–5.1)
Sex Hormone Binding: 40 nmol/L (ref 22–77)
TESTOSTERONE: 331 ng/dL (ref 250–827)
Testosterone, Bioavailable: 74.3 ng/dL — ABNORMAL LOW (ref >=110.0)
Testosterone, Free: 36.1 pg/mL — ABNORMAL LOW (ref 46.0–224.0)

## 2018-02-10 ENCOUNTER — Ambulatory Visit
Admission: RE | Admit: 2018-02-10 | Discharge: 2018-02-10 | Disposition: A | Discharge status: Home / Self Care | Payer: 59 | Source: Ambulatory Visit | Attending: Family Medicine | Admitting: Family Medicine

## 2018-02-10 DIAGNOSIS — R0602 Shortness of breath: Secondary | ICD-10-CM

## 2018-02-18 ENCOUNTER — Other Ambulatory Visit: Payer: Self-pay | Admitting: Family Medicine

## 2018-02-20 NOTE — Telephone Encounter (Signed)
Ok to refill??  Last office visit 02/02/2018.  Last refill 01/23/2018.

## 2018-03-03 ENCOUNTER — Emergency Department (HOSPITAL_COMMUNITY): Payer: 59

## 2018-03-03 ENCOUNTER — Other Ambulatory Visit: Payer: Self-pay

## 2018-03-03 ENCOUNTER — Telehealth: Payer: Self-pay | Admitting: Family Medicine

## 2018-03-03 ENCOUNTER — Encounter (HOSPITAL_COMMUNITY): Payer: Self-pay

## 2018-03-03 ENCOUNTER — Observation Stay (HOSPITAL_COMMUNITY)
Admission: EM | Admit: 2018-03-03 | Discharge: 2018-03-04 | Disposition: A | Discharge status: Home / Self Care | Payer: 59 | Attending: Internal Medicine | Admitting: Internal Medicine

## 2018-03-03 DIAGNOSIS — N529 Male erectile dysfunction, unspecified: Secondary | ICD-10-CM | POA: Diagnosis present

## 2018-03-03 DIAGNOSIS — K219 Gastro-esophageal reflux disease without esophagitis: Secondary | ICD-10-CM | POA: Diagnosis not present

## 2018-03-03 DIAGNOSIS — E669 Obesity, unspecified: Secondary | ICD-10-CM | POA: Diagnosis not present

## 2018-03-03 DIAGNOSIS — R079 Chest pain, unspecified: Secondary | ICD-10-CM

## 2018-03-03 DIAGNOSIS — Z79899 Other long term (current) drug therapy: Secondary | ICD-10-CM | POA: Insufficient documentation

## 2018-03-03 DIAGNOSIS — R072 Precordial pain: Secondary | ICD-10-CM

## 2018-03-03 DIAGNOSIS — E119 Type 2 diabetes mellitus without complications: Secondary | ICD-10-CM | POA: Diagnosis not present

## 2018-03-03 DIAGNOSIS — I2510 Atherosclerotic heart disease of native coronary artery without angina pectoris: Secondary | ICD-10-CM | POA: Diagnosis not present

## 2018-03-03 DIAGNOSIS — I10 Essential (primary) hypertension: Secondary | ICD-10-CM | POA: Diagnosis not present

## 2018-03-03 DIAGNOSIS — J45909 Unspecified asthma, uncomplicated: Secondary | ICD-10-CM | POA: Diagnosis not present

## 2018-03-03 DIAGNOSIS — Z87891 Personal history of nicotine dependence: Secondary | ICD-10-CM | POA: Insufficient documentation

## 2018-03-03 DIAGNOSIS — E1165 Type 2 diabetes mellitus with hyperglycemia: Secondary | ICD-10-CM | POA: Diagnosis present

## 2018-03-03 DIAGNOSIS — R0789 Other chest pain: Principal | ICD-10-CM | POA: Insufficient documentation

## 2018-03-03 DIAGNOSIS — E785 Hyperlipidemia, unspecified: Secondary | ICD-10-CM | POA: Diagnosis not present

## 2018-03-03 DIAGNOSIS — IMO0002 Reserved for concepts with insufficient information to code with codable children: Secondary | ICD-10-CM | POA: Diagnosis present

## 2018-03-03 DIAGNOSIS — E1169 Type 2 diabetes mellitus with other specified complication: Secondary | ICD-10-CM

## 2018-03-03 HISTORY — DX: Type 2 diabetes mellitus with other specified complication: E11.69

## 2018-03-03 HISTORY — DX: Hyperlipidemia, unspecified: E78.5

## 2018-03-03 LAB — BASIC METABOLIC PANEL
ANION GAP: 11 (ref 5–15)
BUN: 15 mg/dL (ref 8–23)
CHLORIDE: 102 mmol/L (ref 98–111)
CO2: 28 mmol/L (ref 22–32)
Calcium: 9.5 mg/dL (ref 8.9–10.3)
Creatinine, Ser: 1.35 mg/dL — ABNORMAL HIGH (ref 0.61–1.24)
GFR calc non Af Amer: 52 mL/min — ABNORMAL LOW (ref >60)
GLUCOSE: 200 mg/dL — AB (ref 70–99)
Potassium: 3.5 mmol/L (ref 3.5–5.1)
Sodium: 141 mmol/L (ref 135–145)

## 2018-03-03 LAB — CBC
HCT: 50.6 % (ref 39.0–52.0)
HEMOGLOBIN: 16.7 g/dL (ref 13.0–17.0)
MCH: 29.5 pg (ref 26.0–34.0)
MCHC: 33 g/dL (ref 30.0–36.0)
MCV: 89.4 fL (ref 78.0–100.0)
Platelets: 284 10*3/uL (ref 150–400)
RBC: 5.66 MIL/uL (ref 4.22–5.81)
RDW: 13 % (ref 11.5–15.5)
WBC: 18.4 10*3/uL — ABNORMAL HIGH (ref 4.0–10.5)

## 2018-03-03 LAB — GLUCOSE, CAPILLARY
Glucose-Capillary: 139 mg/dL — ABNORMAL HIGH (ref 70–99)
Glucose-Capillary: 162 mg/dL — ABNORMAL HIGH (ref 70–99)

## 2018-03-03 LAB — I-STAT TROPONIN, ED
Troponin i, poc: 0 ng/mL (ref 0.00–0.08)
Troponin i, poc: 0.03 ng/mL (ref 0.00–0.08)

## 2018-03-03 LAB — HEMOGLOBIN A1C
HEMOGLOBIN A1C: 6.8 % — AB (ref 4.8–5.6)
MEAN PLASMA GLUCOSE: 148.46 mg/dL

## 2018-03-03 LAB — HEPATIC FUNCTION PANEL
ALBUMIN: 4.1 g/dL (ref 3.5–5.0)
ALT: 22 U/L (ref 0–44)
AST: 24 U/L (ref 15–41)
Alkaline Phosphatase: 62 U/L (ref 38–126)
BILIRUBIN TOTAL: 0.8 mg/dL (ref 0.3–1.2)
Bilirubin, Direct: 0.1 mg/dL (ref 0.0–0.2)
Indirect Bilirubin: 0.7 mg/dL (ref 0.3–0.9)
Total Protein: 7.1 g/dL (ref 6.5–8.1)

## 2018-03-03 LAB — TROPONIN I: Troponin I: <0.03 ng/mL (ref <0.03)

## 2018-03-03 LAB — D-DIMER, QUANTITATIVE (NOT AT ARMC): D DIMER QUANT: 0.53 ug{FEU}/mL — AB (ref 0.00–0.50)

## 2018-03-03 LAB — SEDIMENTATION RATE: Sed Rate: 7 mm/hr (ref 0–16)

## 2018-03-03 MED ORDER — KETOROLAC TROMETHAMINE 60 MG/2ML IM SOLN: 60.0000 mg | Freq: Once | INTRAMUSCULAR | Status: DC

## 2018-03-03 MED ORDER — PRAVASTATIN SODIUM 40 MG PO TABS
40.0000 mg | ORAL_TABLET | Freq: Every day | ORAL | Status: DC
  Administered 2018-03-03 – 2018-03-04 (×2): 40 mg via ORAL
  Filled 2018-03-03 (×2): qty 1

## 2018-03-03 MED ORDER — COLCHICINE 0.6 MG PO TABS: 0.6000 mg | ORAL_TABLET | Freq: Two times a day (BID) | ORAL | Status: DC

## 2018-03-03 MED ORDER — INSULIN ASPART 100 UNIT/ML ~~LOC~~ SOLN: 15.0000 [IU] | Freq: Three times a day (TID) | SUBCUTANEOUS | Status: DC

## 2018-03-03 MED ORDER — UMECLIDINIUM BROMIDE 62.5 MCG/INH IN AEPB
1.0000 | INHALATION_SPRAY | Freq: Every day | RESPIRATORY_TRACT | Status: DC
  Administered 2018-03-04: 1 via RESPIRATORY_TRACT
  Filled 2018-03-03: qty 7

## 2018-03-03 MED ORDER — SODIUM CHLORIDE 0.9% FLUSH: 3.0000 mL | INTRAVENOUS | Status: DC | PRN

## 2018-03-03 MED ORDER — ONDANSETRON HCL 4 MG/2ML IJ SOLN: 4.0000 mg | Freq: Four times a day (QID) | INTRAMUSCULAR | Status: DC | PRN

## 2018-03-03 MED ORDER — ZOLPIDEM TARTRATE 5 MG PO TABS: 5.0000 mg | ORAL_TABLET | Freq: Every evening | ORAL | Status: DC | PRN

## 2018-03-03 MED ORDER — SODIUM CHLORIDE 0.9% FLUSH
3.0000 mL | Freq: Two times a day (BID) | INTRAVENOUS | Status: DC
  Administered 2018-03-03 – 2018-03-04 (×2): 3 mL via INTRAVENOUS

## 2018-03-03 MED ORDER — NITROGLYCERIN 0.4 MG SL SUBL: 0.4000 mg | SUBLINGUAL_TABLET | SUBLINGUAL | Status: DC | PRN

## 2018-03-03 MED ORDER — INSULIN GLARGINE 100 UNIT/ML SOLOSTAR PEN: 30.0000 [IU] | PEN_INJECTOR | Freq: Every day | SUBCUTANEOUS | Status: DC

## 2018-03-03 MED ORDER — INSULIN LISPRO 100 UNIT/ML (KWIKPEN): 15.0000 [IU] | PEN_INJECTOR | Freq: Three times a day (TID) | SUBCUTANEOUS | Status: DC

## 2018-03-03 MED ORDER — ONE-DAILY MULTI VITAMINS PO TABS: 1.0000 | ORAL_TABLET | Freq: Every day | ORAL | Status: DC

## 2018-03-03 MED ORDER — INSULIN ASPART 100 UNIT/ML ~~LOC~~ SOLN
15.0000 [IU] | Freq: Three times a day (TID) | SUBCUTANEOUS | Status: DC
  Administered 2018-03-03 – 2018-03-04 (×2): 15 [IU] via SUBCUTANEOUS

## 2018-03-03 MED ORDER — FLUTICASONE FUROATE-VILANTEROL 100-25 MCG/INH IN AEPB
1.0000 | INHALATION_SPRAY | Freq: Every day | RESPIRATORY_TRACT | Status: DC
  Administered 2018-03-04: 1 via RESPIRATORY_TRACT
  Filled 2018-03-03: qty 28

## 2018-03-03 MED ORDER — SODIUM CHLORIDE 0.9 % IV SOLN: 250.0000 mL | INTRAVENOUS | Status: DC | PRN

## 2018-03-03 MED ORDER — POLYETHYLENE GLYCOL 3350 17 G PO PACK
17.0000 g | PACK | Freq: Every day | ORAL | Status: DC
  Administered 2018-03-04: 17 g via ORAL
  Filled 2018-03-03: qty 1

## 2018-03-03 MED ORDER — INSULIN GLARGINE 100 UNIT/ML ~~LOC~~ SOLN
30.0000 [IU] | Freq: Every day | SUBCUTANEOUS | Status: DC
  Filled 2018-03-03: qty 0.3

## 2018-03-03 MED ORDER — ASPIRIN EC 81 MG PO TBEC
81.0000 mg | DELAYED_RELEASE_TABLET | Freq: Every day | ORAL | Status: DC
  Administered 2018-03-04: 81 mg via ORAL
  Filled 2018-03-03: qty 1

## 2018-03-03 MED ORDER — KETOROLAC TROMETHAMINE 30 MG/ML IJ SOLN
30.0000 mg | Freq: Once | INTRAMUSCULAR | Status: AC
  Administered 2018-03-03: 30 mg via INTRAVENOUS
  Filled 2018-03-03: qty 1

## 2018-03-03 MED ORDER — NITROGLYCERIN 0.4 MG SL SUBL
0.4000 mg | SUBLINGUAL_TABLET | SUBLINGUAL | Status: DC | PRN
  Administered 2018-03-03: 0.4 mg via SUBLINGUAL
  Filled 2018-03-03 (×2): qty 1

## 2018-03-03 MED ORDER — CLONIDINE HCL 0.2 MG PO TABS
0.3000 mg | ORAL_TABLET | Freq: Three times a day (TID) | ORAL | Status: DC
  Administered 2018-03-03 – 2018-03-04 (×2): 0.3 mg via ORAL
  Filled 2018-03-03 (×2): qty 1

## 2018-03-03 MED ORDER — IRBESARTAN 150 MG PO TABS
300.0000 mg | ORAL_TABLET | Freq: Every day | ORAL | Status: DC
  Administered 2018-03-04: 300 mg via ORAL
  Filled 2018-03-03: qty 2

## 2018-03-03 MED ORDER — ENOXAPARIN SODIUM 40 MG/0.4ML ~~LOC~~ SOLN
40.0000 mg | SUBCUTANEOUS | Status: DC
  Administered 2018-03-03: 40 mg via SUBCUTANEOUS
  Filled 2018-03-03: qty 0.4

## 2018-03-03 MED ORDER — AMLODIPINE BESYLATE 10 MG PO TABS
10.0000 mg | ORAL_TABLET | Freq: Every day | ORAL | Status: DC
  Administered 2018-03-04: 10 mg via ORAL
  Filled 2018-03-03: qty 1

## 2018-03-03 MED ORDER — ALPRAZOLAM 0.25 MG PO TABS: 0.2500 mg | ORAL_TABLET | Freq: Two times a day (BID) | ORAL | Status: DC | PRN

## 2018-03-03 MED ORDER — ACETAMINOPHEN 325 MG PO TABS: 650.0000 mg | ORAL_TABLET | ORAL | Status: DC | PRN

## 2018-03-03 MED ORDER — MORPHINE SULFATE (PF) 4 MG/ML IV SOLN
4.0000 mg | Freq: Once | INTRAVENOUS | Status: AC
  Administered 2018-03-03: 4 mg via INTRAVENOUS
  Filled 2018-03-03: qty 1

## 2018-03-03 MED ORDER — AMLODIPINE BESY-BENAZEPRIL HCL 10-40 MG PO CAPS: 1.0000 | ORAL_CAPSULE | Freq: Every day | ORAL | Status: DC

## 2018-03-03 MED ORDER — PANTOPRAZOLE SODIUM 40 MG PO TBEC: 40.0000 mg | DELAYED_RELEASE_TABLET | Freq: Every day | ORAL | Status: DC

## 2018-03-03 MED ORDER — DIAZEPAM 5 MG PO TABS: 5.0000 mg | ORAL_TABLET | Freq: Four times a day (QID) | ORAL | Status: DC | PRN

## 2018-03-03 MED ORDER — FLUTICASONE-UMECLIDIN-VILANT 100-62.5-25 MCG/INH IN AEPB: 1.0000 | INHALATION_SPRAY | Freq: Every day | RESPIRATORY_TRACT | Status: DC

## 2018-03-03 MED ORDER — METOCLOPRAMIDE HCL 10 MG PO TABS: 10.0000 mg | ORAL_TABLET | Freq: Three times a day (TID) | ORAL | Status: DC

## 2018-03-03 MED ORDER — ENSURE ENLIVE PO LIQD
237.0000 mL | Freq: Two times a day (BID) | ORAL | Status: DC
  Administered 2018-03-04: 237 mL via ORAL

## 2018-03-03 MED ORDER — LORATADINE 10 MG PO TABS
10.0000 mg | ORAL_TABLET | Freq: Every day | ORAL | Status: DC
  Administered 2018-03-04: 10 mg via ORAL
  Filled 2018-03-03: qty 1

## 2018-03-03 MED ORDER — FUROSEMIDE 40 MG PO TABS
40.0000 mg | ORAL_TABLET | Freq: Two times a day (BID) | ORAL | Status: DC
  Administered 2018-03-03 – 2018-03-04 (×2): 40 mg via ORAL
  Filled 2018-03-03 (×2): qty 1

## 2018-03-03 MED ORDER — ONDANSETRON HCL 4 MG/2ML IJ SOLN
4.0000 mg | Freq: Once | INTRAMUSCULAR | Status: AC
  Administered 2018-03-03: 4 mg via INTRAVENOUS
  Filled 2018-03-03: qty 2

## 2018-03-03 MED ORDER — POTASSIUM CHLORIDE CRYS ER 20 MEQ PO TBCR
20.0000 meq | EXTENDED_RELEASE_TABLET | Freq: Two times a day (BID) | ORAL | Status: DC
  Administered 2018-03-03 – 2018-03-04 (×2): 20 meq via ORAL
  Filled 2018-03-03 (×2): qty 1

## 2018-03-03 MED ORDER — ADULT MULTIVITAMIN W/MINERALS CH
1.0000 | ORAL_TABLET | Freq: Every day | ORAL | Status: DC
  Administered 2018-03-04: 1 via ORAL
  Filled 2018-03-03: qty 1

## 2018-03-03 MED ORDER — OMEGA-3-ACID ETHYL ESTERS 1 G PO CAPS
1000.0000 mg | ORAL_CAPSULE | Freq: Two times a day (BID) | ORAL | Status: DC
  Administered 2018-03-03 – 2018-03-04 (×2): 1000 mg via ORAL
  Filled 2018-03-03 (×2): qty 1

## 2018-03-03 NOTE — Progress Notes (Addendum)
EKG taken per order, patient denies pain. Paged Cardiology with result.

## 2018-03-03 NOTE — Telephone Encounter (Signed)
Pts wife called in just wanted to let us know pt is on the way to [facility] hospital by EMS with sx of heart attack (shoulder blade pain, chest tightness), debra wanted to let Dr. Mamie Nick know.

## 2018-03-03 NOTE — Progress Notes (Signed)
Reviewed EKG, J point elevation in inferior leads unchanged, mild TWI in V1-V3. Will draw trop to assess. Chest pain very atypical, pleuric in nature, worse with deep inspiration. Had a episode of chest pain 1 hour ago when he came up from ED, quickly resolved, now no more than 1/10 chest pain. EKG reviewed by Dr. Ellyn Hack as well.  Hilbert Corrigan PA Pager: [phone]
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
## 2018-03-03 NOTE — ED Provider Notes (Addendum)
[place] EMERGENCY DEPARTMENT Provider Note   CSN: [id number] Arrival date & time: 03/03/18  1152     History   Chief Complaint CC: Pain in left shoulder blade, chest, and neck.   HPI Dennis Vasquez is a 72 y.o. male.  Patient presents to the emergency department today with acute onset of left scapular pain and a proximally 7 AM.  Patient reports sleeping in a chair a lot recently as he has been helping with his son who is undergoing chemotherapy.  After the pain began he tried to rest without improvement.  Pain gradually started to move into his neck and across his chest.  It is described as a pain and pressure.  He had some palpitations during this.  Patient did not appear well per his wife.  Patient denies diaphoresis and vomiting to me.  He does report associated nausea.  No numbness or tingling in the arms or the legs.  No difficulty walking or with movement of the legs.  He has somewhat of a headache.  Patient took aspirin prior to arrival.  No significant abdominal pain.  In addition, patient has been undergoing a work-up for weight loss, decreased appetite, and fatigue by his PCP over the past several months.  Unknown etiology of the symptoms at this time.  Per records, patient had nonobstructive disease on a catheterization in 2006 and again in 2013. He had a low risk cardiac Myoview in 2015.     Past Medical History:  Diagnosis Date  . Allergic rhinitis   . Asthma   . BPH (benign prostatic hyperplasia)   . 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 COPD (chronic obstructive pulmonary disease) ([place])   . Coronary artery disease 2006   Non obstructive on cath 2006;  Myoview 07/21/11: EF of 61%, and small partially reversible inferior and apical defect consistent with inferior and apical thinning and mild inferior ischemia.  LHC and RHC 08/13/11: PCWP 17, CO2 7.4, CI 2.8, proximal LAD 40-50%, mid RCA 50%, distal RCA 50%, EF 55-65%, essentially normal  intracardiac hemodynamics  . Diabetes mellitus    Type 2 IDDM x 10 yrs  . Elevated triglycerides with high cholesterol   . GERD (gastroesophageal reflux disease)   . Heart murmur   . History of kidney stones   . HNP (herniated nucleus pulposus), lumbar   . Hx of colonic polyps   . Hyperlipidemia   . Hypertension   . Myocardial infarction ([place])   . OSA (obstructive sleep apnea) 06/27/2012  . Osteoarthritis    left knee  . Pneumonia ~2001   out patient  . Pulmonary nodule    16m, stable Dec 2005 through Dec 2006 and May 2009, no further follow-up  . Shortness of breath dyspnea    walking  . Sleep apnea    mild no cpap    Patient Active Problem List   Diagnosis Date Noted  . 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  . Hereditary and idiopathic peripheral neuropathy 05/08/2015  . Numbness 05/08/2015  . Weakness 05/08/2015  . Leg cramps 05/08/2015  . Leg weakness, bilateral 05/08/2015  . S/P lumbar spinal fusion 06/21/2014  . Dyspnea 05/09/2014  . Chest pain 05/09/2014  . GERD (gastroesophageal reflux disease) 04/16/2014  . OSA (obstructive sleep apnea) 06/27/2012  . DM (diabetes mellitus), type 2, uncontrolled ([place] 06/04/2012  . Morbid obesity ([place] 06/03/2012  . Chronic respiratory failure with hypoxia ([place] 06/03/2012  . Routine  general medical examination at a health care facility 06/07/2011  . Lumbar disc disease 12/07/2010  . BENIGN PROSTATIC HYPERTROPHY 05/14/2010  . OSTEOARTHRITIS 05/14/2010  . LUMBAR SPRAIN AND STRAIN 05/05/2009  . COPD with emphysema ([place]) 12/04/2007  . HYPERLIPIDEMIA 02/14/2007  . Essential hypertension 02/14/2007  . ALLERGIC RHINITIS 02/14/2007  . ERECTILE DYSFUNCTION 02/02/2007  . Coronary atherosclerosis 02/02/2007    Past Surgical History:  Procedure Laterality Date  . BACK SURGERY  08/2011   lumbar lamscrews and rods  . CARDIAC CATHETERIZATION  08/2011   Dr Burt Knack  . CARDIOVASCULAR STRESS TEST  2003?  Marland Kitchen  cervical dis repair  1997  . LUMBAR LAMINECTOMY/DECOMPRESSION MICRODISCECTOMY  11/10/2011   Procedure: LUMBAR LAMINECTOMY/DECOMPRESSION MICRODISCECTOMY 1 LEVEL;  Surgeon: Eustace Moore, MD;  Location: [facility];  Service: Neurosurgery;  Laterality: Right;  redo lumbar three - four  . MAXIMUM ACCESS (MAS)POSTERIOR LUMBAR INTERBODY FUSION (PLIF) 1 LEVEL N/A 06/21/2014   Procedure: FOR MAXIMUM ACCESS (MAS) POSTERIOR LUMBAR INTERBODY FUSION LUMBAR THREE TO FOUR (PLIF) 1 LEVEL;  Surgeon: Eustace Moore, MD;  Location: [facility];  Service: Neurosurgery;  Laterality: N/A;  . pneumonia  2001   out pt  . SPINE SURGERY    . TONSILLECTOMY          Home Medications    Prior to Admission medications   Medication Sig Start Date End Date Taking? Authorizing Provider  amLODipine-benazepril (LOTREL) 10-40 MG capsule TAKE 1 CAPSULE BY MOUTH ONCE DAILY 07/11/17   Susy Frizzle, MD  aspirin 325 MG tablet Take 325 mg by mouth daily.    [provider]  blood glucose meter kit and supplies KIT Dispense based on patient and insurance preference. Check BS QID E11.9 11/17/15   Susy Frizzle, MD  Blood Glucose Monitoring Suppl (ONE TOUCH ULTRA SYSTEM KIT) w/Device KIT USE TO TEST BLOOD SUGAR THREE TIMES DAILY- PLEASE DISPENSE ONE TOUCH ULTRA II- DX: E11.65. 11/19/15   Susy Frizzle, MD  cloNIDine (CATAPRES) 0.3 MG tablet TAKE 1 TABLET BY MOUTH 3 TIMES DAILY 07/11/17   Susy Frizzle, MD  diazepam (VALIUM) 5 MG tablet TAKE 1 TABLET BY MOUTH EVERY 6 HOURS AS NEEDED FOR ANXIETY. DO NOT TAKE WITH AMBIEN* 12/22/17   Susy Frizzle, MD  Fluticasone-Umeclidin-Vilant (TRELEGY ELLIPTA) 100-62.5-25 MCG/INH AEPB Inhale 1 Inhaler into the lungs daily. 02/03/18   Susy Frizzle, MD  furosemide (LASIX) 40 MG tablet Take 1 tablet (40 mg total) by mouth 2 (two) times daily. 12/02/17   Susy Frizzle, MD  glucose blood (ONE TOUCH ULTRA TEST) test strip USE TO TEST BLOOD SUGAR  THREE TIMES DAILY 04/06/17    Susy Frizzle, MD  glucose blood test strip Use as instructed 08/26/14   Susy Frizzle, MD  insulin lispro (HUMALOG KWIKPEN) 100 UNIT/ML KiwkPen INJECT MAX 80 UNITS EVERY  MORNING , 60 UNITS EVERY  NOON AND 25 UNITS EVERY  NIGHT AT BEDTIME PER  SLIDING SCALE 10/06/17   Susy Frizzle, MD  Insulin Pen Needle 31G X 5 MM MISC Uses qid with insulin 01/06/17   Susy Frizzle, MD  LANTUS SOLOSTAR 100 UNIT/ML Solostar Pen INJECT SUBCUTANEOUSLY 75  UNITS (0.75ML) EVERY  MORNING 03/28/17   Susy Frizzle, MD  loratadine (CLARITIN) 10 MG tablet Take 1 tablet (10 mg total) by mouth daily. 01/27/18   Susy Frizzle, MD  metoCLOPramide (REGLAN) 10 MG tablet TAKE 1 TABLET BY MOUTH 3 TIMES DAILY BEFORE MEALS  12/12/17   Susy Frizzle, MD  Multiple Vitamin (MULTIVITAMIN) tablet Take 1 tablet by mouth daily.      [provider]  Omega-3 Fatty Acids (FISH OIL) 1200 MG CAPS Take 1,200 mg by mouth 2 (two) times daily.    [provider]  omeprazole (PRILOSEC) 20 MG capsule Take 1 capsule (20 mg total) by mouth daily. Patient taking differently: Take 20 mg by mouth daily as needed (acid reflux).  10/15/15   Susy Frizzle, MD  ONE TOUCH ULTRA TEST test strip USE AS DIRECTED TO TEST BLOOD SUGAR 3 TIMES DAILY 01/17/17   Susy Frizzle, MD  polyethylene glycol (MIRALAX / GLYCOLAX) packet Take 17 g by mouth daily. 12/24/16   Clayton Bibles, PA-C  potassium chloride SA (K-DUR,KLOR-CON) 20 MEQ tablet Take 1 tablet (20 mEq total) by mouth 2 (two) times daily. 12/02/17   Susy Frizzle, MD  pravastatin (PRAVACHOL) 40 MG tablet Take 1 tablet (40 mg total) by mouth daily. 12/02/17   Susy Frizzle, MD  zolpidem (AMBIEN CR) 12.5 MG CR tablet TAKE 1 TABLET(12.5 MG) BY MOUTH AT BEDTIME AS NEEDED FOR SLEEP 02/20/18   Susy Frizzle, MD  clonazePAM (KLONOPIN) 0.5 MG tablet Take 0.5-1 mg by mouth Nightly.   12/07/10 09/28/11  Venia Carbon, MD  potassium chloride (KLOR-CON) 20 MEQ packet Take 20  mEq by mouth daily.    09/28/11  [provider]    Family History Family History  Problem Relation Age of Onset  . Prostate cancer Father   . Aneurysm Father        AAA  . Heart disease Neg Hx        Negative FH for CAD  . Hypertension Neg Hx   . Diabetes Neg Hx   . Anesthesia problems Neg Hx   . Neuropathy Neg Hx   . Colon cancer Neg Hx     Social History Social History   Tobacco Use  . Smoking status: Former Smoker    Packs/day: 2.00    Years: 40.00    Pack years: 80.00    Types: Cigarettes    Last attempt to quit: 07/06/2003    Years since quitting: 14.6  . Smokeless tobacco: Never Used  Substance Use Topics  . Alcohol use: No    Alcohol/week: 0.0 standard drinks  . Drug use: No     Allergies   Duloxetine; Elavil [amitriptyline hcl]; and Lyrica [pregabalin]   Review of Systems Review of Systems  Constitutional: Negative for diaphoresis and fever.  Eyes: Negative for redness.  Respiratory: Negative for cough and shortness of breath.   Cardiovascular: Positive for chest pain. Negative for palpitations and leg swelling.  Gastrointestinal: Negative for abdominal pain, nausea and vomiting.  Genitourinary: Negative for dysuria.  Musculoskeletal: Positive for back pain (L scapular pain) and neck pain.  Skin: Negative for rash.  Neurological: Negative for syncope and light-headedness.  Psychiatric/Behavioral: The patient is not nervous/anxious.      Physical Exam Updated Vital Signs BP (!) 150/98 (BP Location: Right Arm)   Pulse (!) 106   Temp 98.2 F (36.8 C) (Oral)   Resp 20   Ht _0  (1.905 m)   Wt 129.3 kg   SpO2 94%   BMI 35.62 kg/m   Physical Exam  Constitutional: He appears well-developed and well-nourished.  HENT:  Head: Normocephalic and atraumatic.  Mouth/Throat: Mucous membranes are normal. Mucous membranes are not dry.  Eyes: Conjunctivae are normal. Right eye  exhibits no discharge. Left eye exhibits no discharge.  Neck:  Trachea normal and normal range of motion. Neck supple. Normal carotid pulses and no JVD present. No muscular tenderness present. Carotid bruit is not present. No tracheal deviation present.  Cardiovascular: Normal rate, regular rhythm, S1 normal, S2 normal, normal heart sounds and intact distal pulses. Exam reveals no distant heart sounds and no decreased pulses.  No murmur heard. Pulmonary/Chest: Effort normal and breath sounds normal. No respiratory distress. He has no wheezes. He exhibits no tenderness.  Abdominal: Soft. Normal aorta and bowel sounds are normal. There is no tenderness. There is no rebound and no guarding.  Musculoskeletal: He exhibits no edema.  Neurological: He is alert.  Skin: Skin is warm and dry. He is not diaphoretic. No cyanosis. No pallor.  Psychiatric: He has a normal mood and affect.  Nursing note and vitals reviewed.    ED Treatments / Results  Labs (all labs ordered are listed, but only abnormal results are displayed) Labs Reviewed  BASIC METABOLIC PANEL - Abnormal; Notable for the following components:      Result Value   Glucose, Bld 200 (*)    Creatinine, Ser 1.35 (*)    GFR calc non Af Amer 52 (*)    All other components within normal limits  CBC - Abnormal; Notable for the following components:   WBC 18.4 (*)    All other components within normal limits  HEPATIC FUNCTION PANEL  D-DIMER, QUANTITATIVE (NOT AT [facility])  SEDIMENTATION RATE  I-STAT TROPONIN, ED  I-STAT TROPONIN, ED    ED ECG REPORT   Date: 03/03/2018  Rate: 77  Rhythm: normal sinus rhythm  QRS Axis: normal  Intervals: normal  ST/T Wave abnormalities: nonspecific ST/T changes  Conduction Disutrbances:right bundle branch block  Narrative Interpretation:   Old EKG Reviewed: changes noted  I have personally reviewed the EKG tracing and agree with the computerized printout as noted.  Radiology Dg Chest 2 View  Result Date: 03/03/2018 CLINICAL DATA:  Severe chest pain and  shortness of breath. EXAM: CHEST - 2 VIEW COMPARISON:  Chest x-ray dated February 10, 2018. FINDINGS: Stable cardiomediastinal silhouette with enlargement of the pulmonary arteries. Stable eventration of the right hemidiaphragm with adjacent atelectasis/scarring in the right middle lobe. No focal consolidation, pleural effusion, or pneumothorax. No acute osseous abnormality. IMPRESSION: No active cardiopulmonary disease. Electronically Signed   By: Titus Dubin M.D.   On: 03/03/2018 13:04    Procedures Procedures (including critical care time)  Medications Ordered in ED Medications  nitroGLYCERIN (NITROSTAT) SL tablet 0.4 mg (has no administration in time range)     Initial Impression / Assessment and Plan / ED Course  I have reviewed the triage vital signs and the nursing notes.  Pertinent labs & imaging results that were available during my care of the patient were reviewed by me and considered in my medical decision making (see chart for details).     Patient seen and examined. Work-up initiated. Medications ordered.   Vital signs reviewed and are as follows: BP (!) 150/98 (BP Location: Right Arm)   Pulse (!) 106   Temp 98.2 F (36.8 C) (Oral)   Resp 20   Ht '6\' 3"'$  (1.905 m)   Wt 129.3 kg   SpO2 94%   BMI 35.62 kg/m   Pt with concerning story. Initial EKG reviewed. Will re-recheck now that patient is in a room. NTG ordered. Discussed patient with Dr. Sedonia Small.   EKG abnormal. Reviewed with Dr.  Bero x 2. No STEMI but EKG is abnormal. Will speak with cardiology. Pt previously saw Dr. Verl Blalock and Dr. Stanford Breed.   2:38 PM Spoke with cardiology NP and requested consult. They will see patient.   4:37 PM Cardiology has seen patient and is admitting.   Final Clinical Impressions(s) / ED Diagnoses   Final diagnoses:  Precordial pain   Admit.   ED Discharge Orders    None       Carlisle Cater, PA-C 03/03/18 1639    Carlisle Cater, PA-C 03/03/18 2013    Maudie Flakes,  MD 03/03/18 2059

## 2018-03-03 NOTE — ED Triage Notes (Signed)
Pt presents with sudden onset of L scapular pain that began this morning.  Pt thought pain was from sleeping in chair.  Pt went to lie down, reports pain radiated into neck and across his chest.  Pt reports feeling palpitations during pain episode, wife reports pt was pale and diaphoretic.  ASA 325mg  x 2 taken at home.

## 2018-03-04 ENCOUNTER — Other Ambulatory Visit: Payer: Self-pay | Admitting: Nurse Practitioner

## 2018-03-04 DIAGNOSIS — I10 Essential (primary) hypertension: Secondary | ICD-10-CM

## 2018-03-04 DIAGNOSIS — R0789 Other chest pain: Secondary | ICD-10-CM | POA: Diagnosis not present

## 2018-03-04 DIAGNOSIS — I309 Acute pericarditis, unspecified: Secondary | ICD-10-CM | POA: Diagnosis not present

## 2018-03-04 DIAGNOSIS — E669 Obesity, unspecified: Secondary | ICD-10-CM | POA: Diagnosis not present

## 2018-03-04 DIAGNOSIS — E1169 Type 2 diabetes mellitus with other specified complication: Secondary | ICD-10-CM

## 2018-03-04 DIAGNOSIS — E119 Type 2 diabetes mellitus without complications: Secondary | ICD-10-CM | POA: Diagnosis not present

## 2018-03-04 DIAGNOSIS — R079 Chest pain, unspecified: Secondary | ICD-10-CM | POA: Diagnosis not present

## 2018-03-04 DIAGNOSIS — I25119 Atherosclerotic heart disease of native coronary artery with unspecified angina pectoris: Secondary | ICD-10-CM | POA: Diagnosis not present

## 2018-03-04 DIAGNOSIS — E785 Hyperlipidemia, unspecified: Secondary | ICD-10-CM

## 2018-03-04 LAB — CBC WITH DIFFERENTIAL/PLATELET
ABS IMMATURE GRANULOCYTES: 0 10*3/uL (ref 0.0–0.1)
BASOS PCT: 0 %
Basophils Absolute: 0 10*3/uL (ref 0.0–0.1)
Eosinophils Absolute: 0.1 10*3/uL (ref 0.0–0.7)
Eosinophils Relative: 1 %
HEMATOCRIT: 43.9 % (ref 39.0–52.0)
Hemoglobin: 14.6 g/dL (ref 13.0–17.0)
IMMATURE GRANULOCYTES: 0 %
LYMPHS ABS: 1.8 10*3/uL (ref 0.7–4.0)
Lymphocytes Relative: 16 %
MCH: 29.7 pg (ref 26.0–34.0)
MCHC: 33.3 g/dL (ref 30.0–36.0)
MCV: 89.4 fL (ref 78.0–100.0)
MONO ABS: 1 10*3/uL (ref 0.1–1.0)
MONOS PCT: 9 %
NEUTROS ABS: 8.4 10*3/uL — AB (ref 1.7–7.7)
NEUTROS PCT: 74 %
Platelets: 204 10*3/uL (ref 150–400)
RBC: 4.91 MIL/uL (ref 4.22–5.81)
RDW: 13.3 % (ref 11.5–15.5)
WBC: 11.4 10*3/uL — ABNORMAL HIGH (ref 4.0–10.5)

## 2018-03-04 LAB — COMPREHENSIVE METABOLIC PANEL
ALK PHOS: 51 U/L (ref 38–126)
ALT: 17 U/L (ref 0–44)
AST: 16 U/L (ref 15–41)
Albumin: 3.3 g/dL — ABNORMAL LOW (ref 3.5–5.0)
Anion gap: 6 (ref 5–15)
BILIRUBIN TOTAL: 1.1 mg/dL (ref 0.3–1.2)
BUN: 15 mg/dL (ref 8–23)
CALCIUM: 8.8 mg/dL — AB (ref 8.9–10.3)
CO2: 30 mmol/L (ref 22–32)
CREATININE: 1.48 mg/dL — AB (ref 0.61–1.24)
Chloride: 105 mmol/L (ref 98–111)
GFR calc Af Amer: 54 mL/min — ABNORMAL LOW (ref >60)
GFR, EST NON AFRICAN AMERICAN: 47 mL/min — AB (ref >60)
Glucose, Bld: 136 mg/dL — ABNORMAL HIGH (ref 70–99)
Potassium: 4.2 mmol/L (ref 3.5–5.1)
Sodium: 141 mmol/L (ref 135–145)
TOTAL PROTEIN: 5.8 g/dL — AB (ref 6.5–8.1)

## 2018-03-04 LAB — GLUCOSE, CAPILLARY
GLUCOSE-CAPILLARY: 128 mg/dL — AB (ref 70–99)
Glucose-Capillary: 135 mg/dL — ABNORMAL HIGH (ref 70–99)
Glucose-Capillary: 115 mg/dL — ABNORMAL HIGH (ref 70–99)

## 2018-03-04 LAB — HIV ANTIBODY (ROUTINE TESTING W REFLEX): HIV Screen 4th Generation wRfx: NONREACTIVE

## 2018-03-04 LAB — TROPONIN I: Troponin I: <0.03 ng/mL (ref <0.03)

## 2018-03-04 LAB — MRSA PCR SCREENING: MRSA by PCR: NEGATIVE

## 2018-03-04 MED ORDER — ORAL CARE MOUTH RINSE
15.0000 mL | Freq: Two times a day (BID) | OROMUCOSAL | Status: DC
  Administered 2018-03-04: 15 mL via OROMUCOSAL

## 2018-03-04 MED ORDER — ASPIRIN 81 MG PO TABS: 81.0000 mg | ORAL_TABLET | Freq: Every day | ORAL | Status: AC

## 2018-03-04 MED ORDER — INSULIN GLARGINE 100 UNIT/ML SOLOSTAR PEN: 15.0000 [IU] | PEN_INJECTOR | SUBCUTANEOUS | Status: DC

## 2018-03-04 MED ORDER — SODIUM CHLORIDE 0.9 % IV BOLUS
500.0000 mL | Freq: Once | INTRAVENOUS | Status: AC
  Administered 2018-03-04: 500 mL via INTRAVENOUS

## 2018-03-04 MED ORDER — NITROGLYCERIN 0.4 MG SL SUBL: 0.4000 mg | SUBLINGUAL_TABLET | SUBLINGUAL | 3 refills | Status: DC | PRN

## 2018-03-04 NOTE — Progress Notes (Signed)
Progress Note  Patient Name: Dennis Vasquez Date of Encounter: 03/04/2018  Primary Cardiologist: No primary care provider on file.   Subjective   Had chest pain last night, clearly pleuritic in nature, associated with subtle ST segment elevation in the inferior leads.  EGD this morning is pending. Woke up this morning completely asymptomatic.  Walked to the bathroom without problems. Multiple serial troponin assays undetectable (including one drawn about 6 hours after his last episode of chest pain). Blood pressure remains mildly elevated.  Inpatient Medications    Scheduled Meds: . amLODipine  10 mg Oral Daily   And  . irbesartan  300 mg Oral Daily  . aspirin EC  81 mg Oral Daily  . cloNIDine  0.3 mg Oral TID  . enoxaparin (LOVENOX) injection  40 mg Subcutaneous Q24H  . feeding supplement (ENSURE ENLIVE)  237 mL Oral BID BM  . fluticasone furoate-vilanterol  1 puff Inhalation Daily   And  . umeclidinium bromide  1 puff Inhalation Daily  . furosemide  40 mg Oral BID  . insulin aspart  15 Units Subcutaneous TID WC  . insulin glargine  30 Units Subcutaneous Daily  . loratadine  10 mg Oral Daily  . mouth rinse  15 mL Mouth Rinse BID  . multivitamin with minerals  1 tablet Oral Daily  . omega-3 acid ethyl esters  1,000 mg Oral BID  . pantoprazole  40 mg Oral Daily  . polyethylene glycol  17 g Oral Daily  . potassium chloride SA  20 mEq Oral BID  . pravastatin  40 mg Oral Daily  . sodium chloride flush  3 mL Intravenous Q12H   Continuous Infusions: . sodium chloride     PRN Meds: sodium chloride, acetaminophen, ALPRAZolam, diazepam, nitroGLYCERIN, ondansetron (ZOFRAN) IV, sodium chloride flush, zolpidem   Vital Signs    Vitals:   03/03/18 1828 03/03/18 1938 03/04/18 0304 03/04/18 0857  BP: (!) 145/83 (!) 141/87 (!) 145/83   Pulse: 73 74 68   Resp:  17 19   Temp: 97.7 F (36.5 C) 97.8 F (36.6 C) (!) 97.4 F (36.3 C)   TempSrc: Oral Oral Oral   SpO2: 94% 96%  97% 96%  Weight: 128.9 kg  128.9 kg   Height: 6\' 3"  (1.905 m)       Intake/Output Summary (Last 24 hours) at 03/04/2018 0901 Last data filed at 03/03/2018 2200 Gross per 24 hour  Intake 403 ml  Output -  Net 403 ml   Filed Weights   03/03/18 1210 03/03/18 1828 03/04/18 0304  Weight: 129.3 kg 128.9 kg 128.9 kg    Telemetry    Sinus rhythm- Personally Reviewed  ECG    Sinus rhythm, inferior ST segment elevation is even less pronounced than last night - Personally Reviewed  Physical Exam  Tall and muscular but also moderately obese GEN: No acute distress.   Neck: No JVD Cardiac: RRR, no murmurs, no rubs, loud S4 gallop but no S3 Respiratory: Clear to auscultation bilaterally. GI: Soft, nontender, non-distended  MS: No edema; No deformity. Neuro:  Nonfocal  Psych: Normal affect   Labs    Chemistry Recent Labs  Lab 03/03/18 1224 03/03/18 1338 03/04/18 0638  NA 141  --  141  K 3.5  --  4.2  CL 102  --  105  CO2 28  --  30  GLUCOSE 200*  --  136*  BUN 15  --  15  CREATININE 1.35*  --  1.48*  CALCIUM 9.5  --  8.8*  PROT  --  7.1 5.8*  ALBUMIN  --  4.1 3.3*  AST  --  24 16  ALT  --  22 17  ALKPHOS  --  62 51  BILITOT  --  0.8 1.1  GFRNONAA 52*  --  47*  GFRAA >60  --  54*  ANIONGAP 11  --  6     Hematology Recent Labs  Lab 03/03/18 1224 03/04/18 0638  WBC 18.4* 11.4*  RBC 5.66 4.91  HGB 16.7 14.6  HCT 50.6 43.9  MCV 89.4 89.4  MCH 29.5 29.7  MCHC 33.0 33.3  RDW 13.0 13.3  PLT 284 204    Cardiac Enzymes Recent Labs  Lab 03/03/18 1835 03/03/18 2028 03/04/18 0244 03/04/18 0638  TROPONINI <0.03 <0.03 <0.03 <0.03    Recent Labs  Lab 03/03/18 1230 03/03/18 1548  TROPIPOC 0.00 0.03     BNPNo results for input(s): BNP, PROBNP in the last 168 hours.   DDimer  Recent Labs  Lab 03/03/18 1640  DDIMER 0.53*     Radiology    Dg Chest 2 View  Result Date: 03/03/2018 CLINICAL DATA:  Severe chest pain and shortness of breath. EXAM: CHEST  - 2 VIEW COMPARISON:  Chest x-ray dated February 10, 2018. FINDINGS: Stable cardiomediastinal silhouette with enlargement of the pulmonary arteries. Stable eventration of the right hemidiaphragm with adjacent atelectasis/scarring in the right middle lobe. No focal consolidation, pleural effusion, or pneumothorax. No acute osseous abnormality. IMPRESSION: No active cardiopulmonary disease. Electronically Signed   By: Titus Dubin M.D.   On: 03/03/2018 13:04    Cardiac Studies   ECHO: 05/28/2014 - Left ventricle: The cavity size was normal. There was severe concentric hypertrophy. Systolic function was normal. The estimated ejection fraction was in the range of 60% to 65%. Wall motion was normal; there were no regional wall motion abnormalities. Doppler parameters are consistent with abnormal left ventricular relaxation (grade 1 diastolic dysfunction). The E/e&' ratio is between 8-15, suggesting indeterminate LV filling pressure. - Left atrium: LA Volume/BSA= 26.2 ml/m2. The atrium was normal in size. - Right ventricle: The cavity size was normal. Wall thickness was normal. Systolic function was normal. - Right atrium: The atrium was normal in size. - Tricuspid valve: There was trivial regurgitation. - Pulmonary arteries: PA peak pressure: 21 mm Hg (S). - Inferior vena cava: The vessel was normal in size. The respirophasic diameter changes were in the normal range (= 50%), consistent with normal central venous pressure. Impressions: - Compared to the prior echo in 06/2012, there is now moderate to severe LV wall thickness.  MYOVIEW: 05/29/2014 Impression Exercise Capacity: [place] with no exercise. BP Response: Normal blood pressure response. Clinical Symptoms: No significant symptoms noted. ECG Impression: No significant ST segment change suggestive of ischemia. Comparison with Prior Nuclear Study: No significant change from previous study  Overall  Impression: Low risk stress nuclear study Diaphragmatic attenuation. No ischemia.  LV Wall Motion: NL LV Function; NL Wall Motion   CATH: 08/13/2011 Left mainstem: Widely patent with no obstructive disease  Left anterior descending (LAD): The LAD is diffusely calcified. There is mild proximal vessel stenosis in the range of 40-50%. There is associated calcification. The LAD wraps around the left ventricular apex. There is no significant distal vessel stenosis.  Left circumflex (LCx): The left circumflex is widely patent throughout. The proximal vessel is tortuous. There are 2 large obtuse marginal branches with no stenosis.  Right coronary artery (RCA):  The RCA is dominant. The vessel has a 50% mid stenosis. The mid and distal vessel tapers and is mildly disease. The distal vessel has another 50% stenosis before the bifurcation of the PDA and posterolateral segments. The PDA branch is small in caliber.  Left ventriculography: Left ventricular systolic function is normal, LVEF is estimated at 55-65%, there is no significant mitral regurgitation   Final Conclusions:  1. Mild diffuse coronary artery disease 2. Normal LV function 3. Essentially normal intracardiac hemodynamics  Patient Profile     72 y.o. male with a hx of mod CAD at cath 2013, DM, HTN, HLD, obesity, BPH, OA, OSA, COPD/emphysema, obesity hypoventilation syndrome, nl EF by echo, who presented with atypical chest pain (positional, pleuritic), low risk biomarkers and nonspecific ECG changes  Assessment & Plan    1. Chest pain: Coronary etiology cannot be completely excluded, the burden of evidence suggest that he may have an episode of acute pericarditis, likely postviral.  Currently asymptomatic so anti-inflammatory therapy does not appear to be necessary.  Negative biomarkers.  Subtle inferior ST segment elevation.  Suspect this will be a self resolving problem, without long-term sequelae. 2. CAD: Is  well-established moderate coronary disease and should undergo further evaluation to make sure that this was not an atypical episode of coronary insufficiency.  Currently asymptomatic with negative biomarkers and minor ECG changes, he is appropriate for outpatient follow-up: schedule for outpatient Lexiscan Myoview.  He has serious back problems and uses a walker, cannot exercise on the treadmill.  On statin, LDL at target.  Hemolobin A1c is less than 7%. 3. HTN:  difficult to control despite high-dose clonidine, full dose amlodipine and ARB.  Significant LVH on echocardiogram.  Would like to avoid diuretic due to the presence of mild renal insufficiency.  Was recommended renal artery duplex ultrasound, which has not yet been performed.  We will schedule as an outpatient.  For questions or updates, please contact [place] Please consult [email or web address] for contact info under Cardiology/STEMI.      Signed, Sanda Klein, MD  03/04/2018, 9:01 AM

## 2018-03-04 NOTE — Progress Notes (Signed)
NS bolus completed. Pt ambulated 150 ft on RA, rolling walker, independent, slow, steady gait, tolerated well with no complaints. Denies cp, dizziness, shortness of breath. VSS. Per Dr. Sallyanne Kuster pt ok for discharge.  Diona Browner, RN, BSN

## 2018-03-04 NOTE — Progress Notes (Signed)
Pt ambulated to bathroom, HR elevated 140s, pt c/o mild dizziness, nausea and diaphoresis. MD notified. VSS. Orders received to give 500 cc bolus of normal saline and ambulate when completed. Will continue to monitor.   Diona Browner, RN, BSN
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
## 2018-03-04 NOTE — Discharge Instructions (Signed)
**  PLEASE REMEMBER TO BRING ALL OF YOUR MEDICATIONS TO EACH OF YOUR FOLLOW-UP OFFICE VISITS. ° °  ° °10 Habits of Highly Healthy People ° °[facility] wants to help you get well and stay well.  Live a longer, healthier life by practicing healthy habits every day. ° °1.  Visit your primary care provider regularly. °2.  Make time for family and friends.  Healthy relationships are important. °3.  Take medications as directed by your provider. °4.  Maintain a healthy weight and a trim waistline. °5.  Eat healthy meals and snacks, rich in fruits, vegetables, whole grains, and lean proteins. °6.  Get moving every day - aim for 150 minutes of moderate physical activity each week. °7.  Don't smoke. °8.  Avoid alcohol or drink in moderation. °9.  Manage stress through meditation or mindful relaxation. °10.  Get seven to nine hours of quality sleep each night. ° °Want more information on healthy habits?  To learn more about these and other healthy habits, visit [email or web address]. °_____________ °  ° °  ° °You have received care from  [facility] during this hospital stay and we look forward to continuing to provide you with excellent care in our office settings after you've left the hospital.  In order to assure a smoother transition to home following your discharge from the hospital, we will likely have you see one of our nurse practitioners or physician assistants within a few weeks of discharge.  Our advanced practice providers work closely with your physician in order to address all of your heart's needs in a timely manner.  More information about all of our providers may be found here: [email or web address] ° °Please plan to bring all of your prescriptions to your follow-up appointment and don't hesitate to contact us with questions or concerns. ° °[facility] [place] - [phone] °[facility] [place] - [phone] °[facility] [address] - [phone] °[facility] - [phone] °[facility] [place] - [phone] °[facility] [place] - [phone] °[facility] - [phone] °[facility] - [phone] °[facility] - [phone]  ° °

## 2018-03-10 ENCOUNTER — Telehealth: Payer: Self-pay | Admitting: Family Medicine

## 2018-03-10 ENCOUNTER — Telehealth: Payer: Self-pay | Admitting: Cardiology

## 2018-03-10 MED ORDER — TRAZODONE HCL 50 MG PO TABS: ORAL_TABLET | ORAL | 3 refills | Status: DC

## 2018-03-10 NOTE — Telephone Encounter (Signed)
He could try trazodone 50-100 mg poqhs.

## 2018-03-10 NOTE — Telephone Encounter (Signed)
Pt's wife called and states that he is having a lot of trouble sleeping and the Ambien is not working at all. Is there something else you could give him to help him sleep?? He has been up for 3 straight days.

## 2018-03-10 NOTE — Telephone Encounter (Signed)
New Message         Patient's wife is returning [name] call

## 2018-03-10 NOTE — Telephone Encounter (Signed)
Dennis Vasquez aware via vm and med sent to [facility]

## 2018-03-16 ENCOUNTER — Telehealth (HOSPITAL_COMMUNITY): Payer: Self-pay

## 2018-03-16 ENCOUNTER — Telehealth: Payer: Self-pay | Admitting: Family Medicine

## 2018-03-16 ENCOUNTER — Encounter (HOSPITAL_COMMUNITY): Payer: 59

## 2018-03-16 NOTE — Telephone Encounter (Signed)
Pt's wife called and states that the Trazodone did not work for him to sleep and he will just go back to the Ambien like before. She would like for [place] to take off his list.

## 2018-03-16 NOTE — Telephone Encounter (Signed)
Encounter complete. 

## 2018-03-20 ENCOUNTER — Other Ambulatory Visit: Payer: Self-pay | Admitting: Family Medicine

## 2018-03-20 NOTE — Telephone Encounter (Signed)
Requesting refill    Ambien and Valium  LOV:  02/02/2018  LRF:  12/22/17 Valium  02/20/18 Ambien (Trazodone did not work for him - see note 03/16/18)

## 2018-03-20 NOTE — Telephone Encounter (Signed)
Has he seen GI yet for weight loss.  Needs to see GI.

## 2018-03-20 NOTE — Telephone Encounter (Signed)
Scheduled for 04/11/18

## 2018-03-21 ENCOUNTER — Inpatient Hospital Stay (HOSPITAL_COMMUNITY): Admission: RE | Admit: 2018-03-21 | Payer: 59 | Source: Ambulatory Visit

## 2018-03-21 ENCOUNTER — Other Ambulatory Visit: Payer: Self-pay | Admitting: Family Medicine

## 2018-03-22 ENCOUNTER — Inpatient Hospital Stay (HOSPITAL_COMMUNITY): Admission: RE | Admit: 2018-03-22 | Payer: 59 | Source: Ambulatory Visit

## 2018-03-28 ENCOUNTER — Encounter (HOSPITAL_COMMUNITY): Payer: 59

## 2018-03-30 ENCOUNTER — Telehealth (HOSPITAL_COMMUNITY): Payer: Self-pay

## 2018-03-30 NOTE — Telephone Encounter (Signed)
Encounter complete. 

## 2018-04-03 ENCOUNTER — Other Ambulatory Visit: Payer: Self-pay | Admitting: Family Medicine

## 2018-04-03 ENCOUNTER — Ambulatory Visit: Payer: 59 | Admitting: Cardiology

## 2018-04-04 ENCOUNTER — Ambulatory Visit (HOSPITAL_COMMUNITY)
Admission: RE | Admit: 2018-04-04 | Discharge: 2018-04-04 | Disposition: A | Discharge status: Home / Self Care | Payer: 59 | Source: Ambulatory Visit | Attending: Cardiovascular Disease | Admitting: Cardiovascular Disease

## 2018-04-04 DIAGNOSIS — R0789 Other chest pain: Secondary | ICD-10-CM

## 2018-04-04 DIAGNOSIS — R079 Chest pain, unspecified: Secondary | ICD-10-CM

## 2018-04-04 MED ORDER — REGADENOSON 0.4 MG/5ML IV SOLN: 0.4000 mg | Freq: Once | INTRAVENOUS | Status: DC

## 2018-04-04 MED ORDER — TECHNETIUM TC 99M TETROFOSMIN IV KIT
30.6000 | PACK | Freq: Once | INTRAVENOUS | Status: DC | PRN
  Filled 2018-04-04: qty 31

## 2018-04-05 ENCOUNTER — Ambulatory Visit (HOSPITAL_COMMUNITY)
Admission: RE | Admit: 2018-04-05 | Discharge: 2018-04-05 | Disposition: A | Discharge status: Home / Self Care | Payer: 59 | Source: Ambulatory Visit | Attending: Internal Medicine | Admitting: Internal Medicine

## 2018-04-05 DIAGNOSIS — I10 Essential (primary) hypertension: Secondary | ICD-10-CM | POA: Diagnosis not present

## 2018-04-05 LAB — MYOCARDIAL PERFUSION IMAGING
CHL CUP NUCLEAR SDS: 2
CHL CUP NUCLEAR SRS: 1
CHL CUP NUCLEAR SSS: 3
LV dias vol: 120 mL (ref 62–150)
LV sys vol: 51 mL
NUC STRESS TID: 0.74
Peak HR: 102 {beats}/min
Rest HR: 86 {beats}/min

## 2018-04-05 MED ORDER — TECHNETIUM TC 99M TETROFOSMIN IV KIT
29.4000 | PACK | Freq: Once | INTRAVENOUS | Status: AC | PRN
  Administered 2018-04-05: 29.7 via INTRAVENOUS

## 2018-04-11 ENCOUNTER — Encounter

## 2018-04-11 ENCOUNTER — Ambulatory Visit: Payer: 59 | Admitting: Gastroenterology

## 2018-04-11 ENCOUNTER — Ambulatory Visit: Payer: 59 | Admitting: Cardiology

## 2018-04-19 ENCOUNTER — Other Ambulatory Visit: Payer: Self-pay | Admitting: Family Medicine

## 2018-04-19 NOTE — Telephone Encounter (Signed)
Ok to refill??  Last office visit 02/02/2018.  Last refill 03/20/2018.

## 2018-04-25 ENCOUNTER — Ambulatory Visit: Payer: 59 | Admitting: Cardiology

## 2018-05-01 ENCOUNTER — Ambulatory Visit (INDEPENDENT_AMBULATORY_CARE_PROVIDER_SITE_OTHER): Payer: 59 | Admitting: Family Medicine

## 2018-05-01 ENCOUNTER — Other Ambulatory Visit: Payer: Self-pay

## 2018-05-01 ENCOUNTER — Encounter: Payer: Self-pay | Admitting: Family Medicine

## 2018-05-01 VITALS — BP 140/78 | HR 82 | Temp 98.1°F | Resp 16 | Ht 75.0 in | Wt 291.0 lb

## 2018-05-01 DIAGNOSIS — R5382 Chronic fatigue, unspecified: Secondary | ICD-10-CM | POA: Diagnosis not present

## 2018-05-01 DIAGNOSIS — R634 Abnormal weight loss: Secondary | ICD-10-CM

## 2018-05-01 DIAGNOSIS — R0602 Shortness of breath: Secondary | ICD-10-CM | POA: Diagnosis not present

## 2018-05-01 DIAGNOSIS — R6881 Early satiety: Secondary | ICD-10-CM

## 2018-05-01 DIAGNOSIS — K5909 Other constipation: Secondary | ICD-10-CM

## 2018-05-01 MED ORDER — UMECLIDINIUM-VILANTEROL 62.5-25 MCG/INH IN AEPB: 1.0000 | INHALATION_SPRAY | Freq: Every day | RESPIRATORY_TRACT | 3 refills | Status: DC

## 2018-05-01 MED ORDER — DIAZEPAM 5 MG PO TABS: 5.0000 mg | ORAL_TABLET | Freq: Two times a day (BID) | ORAL | 0 refills | Status: DC | PRN
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
## 2018-05-03 ENCOUNTER — Other Ambulatory Visit: Payer: Self-pay | Admitting: Family Medicine

## 2018-05-08 ENCOUNTER — Ambulatory Visit: Payer: 59 | Admitting: Gastroenterology

## 2018-05-10 ENCOUNTER — Ambulatory Visit (INDEPENDENT_AMBULATORY_CARE_PROVIDER_SITE_OTHER): Payer: 59

## 2018-05-10 DIAGNOSIS — Z23 Encounter for immunization: Secondary | ICD-10-CM | POA: Diagnosis not present

## 2018-05-10 NOTE — Progress Notes (Signed)
Patient was in office and received high dose flu vaccine in right deltoid and pneuvax 23 in right arm subcutaneous.Patient tolerated well

## 2018-05-16 ENCOUNTER — Other Ambulatory Visit: Payer: Self-pay | Admitting: Family Medicine

## 2018-05-16 NOTE — Telephone Encounter (Signed)
Ok to refill??  Last office visit 05/01/2018.  Last refill 04/20/2018.

## 2018-05-25 ENCOUNTER — Other Ambulatory Visit: Payer: Self-pay | Admitting: Family Medicine

## 2018-05-25 NOTE — Telephone Encounter (Signed)
Ok to refill??  Last office visit/ refill 05/01/2018.

## 2018-05-31 ENCOUNTER — Other Ambulatory Visit: Payer: Self-pay | Admitting: Family Medicine

## 2018-06-12 ENCOUNTER — Other Ambulatory Visit: Payer: Self-pay | Admitting: Family Medicine

## 2018-06-12 NOTE — Telephone Encounter (Signed)
Requesting refill    ambien  LOV: 05/01/18  LRF:  05/16/18

## 2018-06-15 ENCOUNTER — Other Ambulatory Visit: Payer: Self-pay | Admitting: Family Medicine

## 2018-06-18 ENCOUNTER — Other Ambulatory Visit: Payer: Self-pay | Admitting: Family Medicine

## 2018-06-19 NOTE — Telephone Encounter (Signed)
Requesting refill    Valium  LOV:  05/01/18  LRF:  05/25/18

## 2018-07-20 ENCOUNTER — Other Ambulatory Visit: Payer: Self-pay | Admitting: Family Medicine

## 2018-07-20 NOTE — Telephone Encounter (Signed)
Ok to refill??  Last office visit 05/01/2018.  Last refill 06/12/2018.

## 2018-07-22 ENCOUNTER — Other Ambulatory Visit: Payer: Self-pay | Admitting: *Deleted

## 2018-07-22 MED ORDER — BENAZEPRIL HCL 40 MG PO TABS: 40.0000 mg | ORAL_TABLET | Freq: Every day | ORAL | 3 refills | Status: DC

## 2018-07-22 MED ORDER — AMLODIPINE BESYLATE 10 MG PO TABS: 10.0000 mg | ORAL_TABLET | Freq: Every day | ORAL | 3 refills | Status: DC

## 2018-07-22 MED ORDER — CLONIDINE HCL 0.3 MG PO TABS: 0.3000 mg | ORAL_TABLET | Freq: Three times a day (TID) | ORAL | 3 refills | Status: DC

## 2018-08-10 ENCOUNTER — Other Ambulatory Visit: Payer: Self-pay | Admitting: Family Medicine

## 2018-08-10 NOTE — Telephone Encounter (Signed)
Ok to refill??  Last office visit 05/01/2018.  Last refill 06/19/2018.

## 2018-08-25 ENCOUNTER — Telehealth: Payer: Self-pay | Admitting: Family Medicine

## 2018-08-25 ENCOUNTER — Other Ambulatory Visit: Payer: Self-pay | Admitting: Family Medicine

## 2018-08-25 NOTE — Telephone Encounter (Signed)
Pt is not able to sleep on the Ambien any more and would like to know if here is something else or increase the dose?  Trazodone did not work for him

## 2018-08-25 NOTE — Telephone Encounter (Signed)
Be glad to see him to discuss options.  Dangerous to add other meds to valium.

## 2018-08-25 NOTE — Telephone Encounter (Signed)
Ok to refill??  Last office visit 05/01/2018.  Last refill 07/20/2018.

## 2018-08-25 NOTE — Telephone Encounter (Signed)
Pt's wife aware via vm 

## 2018-09-05 ENCOUNTER — Other Ambulatory Visit: Payer: Self-pay | Admitting: Family Medicine

## 2018-09-08 ENCOUNTER — Telehealth: Payer: Self-pay | Admitting: Family Medicine

## 2018-09-08 NOTE — Telephone Encounter (Signed)
Pt has been summonsed to do jury duty, but is not capable of doing so. He would like a letter faxed to [employer] and debra would like to pick up copy.   Jury summons has been placed into yellow folder.

## 2018-09-11 NOTE — Telephone Encounter (Signed)
Form placed in Dr. Servando Snare Folder

## 2018-09-19 NOTE — Telephone Encounter (Signed)
[facility] (Does pt work? Per Dr. Dennard Schaumann )

## 2018-09-20 NOTE — Telephone Encounter (Signed)
Pt does not work and d/t multiple medical issues would like to be dismissed from jury duty. Paperwork resent to Dr. Dennard Schaumann

## 2018-09-21 ENCOUNTER — Other Ambulatory Visit: Payer: Self-pay | Admitting: Family Medicine

## 2018-09-21 NOTE — Telephone Encounter (Signed)
Last office visit: 05/01/2018 Last filled: 08/25/2018

## 2018-09-25 ENCOUNTER — Encounter: Payer: Self-pay | Admitting: Family Medicine

## 2018-09-26 NOTE — Telephone Encounter (Signed)
Paperwork completed and faxed to courthouse with confirmation

## 2018-10-06 ENCOUNTER — Telehealth: Payer: Self-pay | Admitting: Family Medicine

## 2018-10-06 NOTE — Telephone Encounter (Signed)
[name] calling regarding her husbands letter for jury duty, they let her know that this was not sufficient and she would like to go over with you what the specifics need to say [phone]

## 2018-10-06 NOTE — Telephone Encounter (Signed)
They denied his being dismissed from jury duty stating that it needs to be more detailed as in it needs to states his medical reason he can not do jury duty. IE - his back pain and surgeries, multiple myeloma ETC.

## 2018-10-09 ENCOUNTER — Encounter: Payer: Self-pay | Admitting: Family Medicine

## 2018-10-09 NOTE — Telephone Encounter (Signed)
New letter faxed to courthouse

## 2018-10-09 NOTE — Telephone Encounter (Signed)
Letter is written and in chart.

## 2018-10-13 ENCOUNTER — Other Ambulatory Visit: Payer: Self-pay | Admitting: Family Medicine

## 2018-10-16 NOTE — Telephone Encounter (Signed)
Ok to refill??  Last office visit 05/01/2018.  Last refill 08/10/2018, #1 refill.

## 2018-10-18 ENCOUNTER — Other Ambulatory Visit: Payer: Self-pay | Admitting: Family Medicine

## 2018-10-18 MED ORDER — AMLODIPINE BESY-BENAZEPRIL HCL 10-40 MG PO CAPS: 1.0000 | ORAL_CAPSULE | Freq: Every day | ORAL | 3 refills | Status: DC

## 2018-10-18 NOTE — Telephone Encounter (Signed)
Requesting refill    Ambien  LOV: 05/01/18  LRF:  09/21/18

## 2018-11-15 ENCOUNTER — Other Ambulatory Visit: Payer: Self-pay | Admitting: Family Medicine

## 2018-11-15 NOTE — Telephone Encounter (Signed)
Requesting refill    Valium  LOV:  05/01/18  LRF:  10/16/18

## 2018-11-30 ENCOUNTER — Other Ambulatory Visit: Payer: Self-pay | Admitting: Family Medicine

## 2018-12-12 ENCOUNTER — Other Ambulatory Visit: Payer: Self-pay | Admitting: Family Medicine

## 2018-12-12 NOTE — Telephone Encounter (Signed)
Ok to refill Diazepam??  Last office visit 05/01/2018.  Last refill 11/16/2018.

## 2018-12-22 ENCOUNTER — Encounter: Payer: Self-pay | Admitting: Gastroenterology

## 2018-12-25 ENCOUNTER — Other Ambulatory Visit: Payer: Self-pay | Admitting: Family Medicine
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
## 2019-01-15 ENCOUNTER — Other Ambulatory Visit: Payer: Self-pay | Admitting: Family Medicine

## 2019-01-15 NOTE — Telephone Encounter (Signed)
Ok to refill??  Last office visit 05/01/2018.  Last refill 12/12/2018.

## 2019-01-16 ENCOUNTER — Other Ambulatory Visit: Payer: Self-pay | Admitting: Family Medicine

## 2019-01-26 ENCOUNTER — Other Ambulatory Visit: Payer: Self-pay | Admitting: Family Medicine

## 2019-02-14 ENCOUNTER — Other Ambulatory Visit: Payer: Self-pay | Admitting: Family Medicine

## 2019-02-14 NOTE — Telephone Encounter (Signed)
Requesting refill    Ambien & Valium  LOV: 05/01/18  LRF:  10/19/18 & 01/16/19

## 2019-03-13 ENCOUNTER — Other Ambulatory Visit: Payer: Self-pay | Admitting: Family Medicine

## 2019-03-13 NOTE — Telephone Encounter (Signed)
Last filled: 02/14/2019 Last office visit: 05/01/2018

## 2019-04-02 ENCOUNTER — Encounter: Payer: 59 | Admitting: Family Medicine

## 2019-04-02 ENCOUNTER — Other Ambulatory Visit: Payer: Self-pay | Admitting: Family Medicine

## 2019-04-12 ENCOUNTER — Other Ambulatory Visit: Payer: Self-pay | Admitting: Family Medicine

## 2019-04-12 NOTE — Telephone Encounter (Signed)
Ok to refill??  Last office visit 05/01/2018.  Last refill 9/8/220 on both.   Of note, patient has appt scheduled for 05/02/2019.

## 2019-04-20 ENCOUNTER — Other Ambulatory Visit: Payer: Self-pay | Admitting: Family Medicine

## 2019-04-25 ENCOUNTER — Other Ambulatory Visit: Payer: Self-pay | Admitting: Family Medicine

## 2019-05-02 ENCOUNTER — Other Ambulatory Visit: Payer: Self-pay

## 2019-05-02 ENCOUNTER — Ambulatory Visit (INDEPENDENT_AMBULATORY_CARE_PROVIDER_SITE_OTHER): Payer: 59

## 2019-05-02 ENCOUNTER — Other Ambulatory Visit: Payer: 59

## 2019-05-02 DIAGNOSIS — Z79899 Other long term (current) drug therapy: Secondary | ICD-10-CM

## 2019-05-02 DIAGNOSIS — E1121 Type 2 diabetes mellitus with diabetic nephropathy: Secondary | ICD-10-CM

## 2019-05-02 DIAGNOSIS — I10 Essential (primary) hypertension: Secondary | ICD-10-CM

## 2019-05-02 DIAGNOSIS — Z125 Encounter for screening for malignant neoplasm of prostate: Secondary | ICD-10-CM

## 2019-05-02 DIAGNOSIS — Z794 Long term (current) use of insulin: Secondary | ICD-10-CM

## 2019-05-02 DIAGNOSIS — Z0000 Encounter for general adult medical examination without abnormal findings: Secondary | ICD-10-CM

## 2019-05-02 DIAGNOSIS — Z23 Encounter for immunization: Secondary | ICD-10-CM

## 2019-05-02 DIAGNOSIS — E7800 Pure hypercholesterolemia, unspecified: Secondary | ICD-10-CM

## 2019-05-03 LAB — CBC WITH DIFFERENTIAL/PLATELET
Absolute Monocytes: 653 cells/uL (ref 200–950)
Basophils Absolute: 40 cells/uL (ref 0–200)
Basophils Relative: 0.4 %
Eosinophils Absolute: 188 cells/uL (ref 15–500)
Eosinophils Relative: 1.9 %
HCT: 49 % (ref 38.5–50.0)
Hemoglobin: 16.3 g/dL (ref 13.2–17.1)
Lymphs Abs: 1643 cells/uL (ref 850–3900)
MCH: 30.1 pg (ref 27.0–33.0)
MCHC: 33.3 g/dL (ref 32.0–36.0)
MCV: 90.4 fL (ref 80.0–100.0)
MPV: 9.7 fL (ref 7.5–12.5)
Monocytes Relative: 6.6 %
Neutro Abs: 7376 cells/uL (ref 1500–7800)
Neutrophils Relative %: 74.5 %
Platelets: 255 10*3/uL (ref 140–400)
RBC: 5.42 10*6/uL (ref 4.20–5.80)
RDW: 13 % (ref 11.0–15.0)
Total Lymphocyte: 16.6 %
WBC: 9.9 10*3/uL (ref 3.8–10.8)

## 2019-05-03 LAB — HEMOGLOBIN A1C
Hgb A1c MFr Bld: 7 % of total Hgb — ABNORMAL HIGH (ref <5.7)
Mean Plasma Glucose: 154 (calc)
eAG (mmol/L): 8.5 (calc)

## 2019-05-03 LAB — COMPREHENSIVE METABOLIC PANEL
AG Ratio: 1.8 (calc) (ref 1.0–2.5)
ALT: 35 U/L (ref 9–46)
AST: 29 U/L (ref 10–35)
Albumin: 4.3 g/dL (ref 3.6–5.1)
Alkaline phosphatase (APISO): 58 U/L (ref 35–144)
BUN/Creatinine Ratio: 11 (calc) (ref 6–22)
BUN: 14 mg/dL (ref 7–25)
CO2: 25 mmol/L (ref 20–32)
Calcium: 9.2 mg/dL (ref 8.6–10.3)
Chloride: 103 mmol/L (ref 98–110)
Creat: 1.32 mg/dL — ABNORMAL HIGH (ref 0.70–1.18)
Globulin: 2.4 g/dL (calc) (ref 1.9–3.7)
Glucose, Bld: 193 mg/dL — ABNORMAL HIGH (ref 65–99)
Potassium: 4.8 mmol/L (ref 3.5–5.3)
Sodium: 141 mmol/L (ref 135–146)
Total Bilirubin: 0.6 mg/dL (ref 0.2–1.2)
Total Protein: 6.7 g/dL (ref 6.1–8.1)

## 2019-05-03 LAB — PSA: PSA: 0.7 ng/mL (ref <=4.0)

## 2019-05-03 LAB — LIPID PANEL
Cholesterol: 162 mg/dL (ref <200)
HDL: 43 mg/dL (ref >=40)
LDL Cholesterol (Calc): 90 mg/dL (calc)
Non-HDL Cholesterol (Calc): 119 mg/dL (calc) (ref <130)
Total CHOL/HDL Ratio: 3.8 (calc) (ref <5.0)
Triglycerides: 194 mg/dL — ABNORMAL HIGH (ref <150)

## 2019-05-04 ENCOUNTER — Encounter: Payer: 59 | Admitting: Family Medicine

## 2019-05-10 ENCOUNTER — Other Ambulatory Visit: Payer: Self-pay | Admitting: Family Medicine

## 2019-05-10 NOTE — Telephone Encounter (Signed)
Ok to refill??  Last office visit 05/01/2018.  Last refill 04/12/2019 for both.   Patient has CPE scheduled on 06/26/2019.

## 2019-05-13 ENCOUNTER — Other Ambulatory Visit: Payer: Self-pay | Admitting: Family Medicine

## 2019-05-25 ENCOUNTER — Other Ambulatory Visit: Payer: Self-pay | Admitting: Family Medicine

## 2019-06-12 ENCOUNTER — Other Ambulatory Visit: Payer: Self-pay | Admitting: Family Medicine

## 2019-06-12 MED ORDER — DIAZEPAM 5 MG PO TABS: ORAL_TABLET | ORAL | 0 refills | Status: DC

## 2019-06-12 MED ORDER — ZOLPIDEM TARTRATE ER 12.5 MG PO TBCR: EXTENDED_RELEASE_TABLET | ORAL | 0 refills | Status: DC

## 2019-06-12 NOTE — Telephone Encounter (Signed)
Requesting refill    Valium & Ambien  LOV: 05/01/18  LRF:  05/10/19

## 2019-06-26 ENCOUNTER — Encounter: Payer: 59 | Admitting: Family Medicine

## 2019-07-10 ENCOUNTER — Other Ambulatory Visit: Payer: Self-pay | Admitting: Family Medicine

## 2019-07-10 NOTE — Telephone Encounter (Signed)
Ok to refill??  Last office visit 05/01/2018.  Last refill 06/12/2019.  Of note, patient has appointment for CPE on 08/28/2019.

## 2019-08-22 ENCOUNTER — Other Ambulatory Visit: Payer: Self-pay | Admitting: Family Medicine

## 2019-08-22 MED ORDER — LORATADINE 10 MG PO TABS: 10.0000 mg | ORAL_TABLET | Freq: Every day | ORAL | 3 refills | Status: DC

## 2019-08-28 ENCOUNTER — Encounter: Payer: 59 | Admitting: Family Medicine

## 2019-08-30 ENCOUNTER — Encounter: Payer: Self-pay | Admitting: Family Medicine

## 2019-08-30 ENCOUNTER — Ambulatory Visit (INDEPENDENT_AMBULATORY_CARE_PROVIDER_SITE_OTHER): Payer: 59 | Admitting: Family Medicine

## 2019-08-30 VITALS — BP 132/78 | HR 82 | Temp 96.5°F | Resp 18 | Ht 75.0 in | Wt 314.0 lb

## 2019-08-30 DIAGNOSIS — Z125 Encounter for screening for malignant neoplasm of prostate: Secondary | ICD-10-CM | POA: Diagnosis not present

## 2019-08-30 DIAGNOSIS — E785 Hyperlipidemia, unspecified: Secondary | ICD-10-CM

## 2019-08-30 DIAGNOSIS — R0989 Other specified symptoms and signs involving the circulatory and respiratory systems: Secondary | ICD-10-CM

## 2019-08-30 DIAGNOSIS — D472 Monoclonal gammopathy: Secondary | ICD-10-CM

## 2019-08-30 DIAGNOSIS — R0600 Dyspnea, unspecified: Secondary | ICD-10-CM

## 2019-08-30 DIAGNOSIS — Z0000 Encounter for general adult medical examination without abnormal findings: Secondary | ICD-10-CM | POA: Diagnosis not present

## 2019-08-30 DIAGNOSIS — E1169 Type 2 diabetes mellitus with other specified complication: Secondary | ICD-10-CM

## 2019-08-30 DIAGNOSIS — R0609 Other forms of dyspnea: Secondary | ICD-10-CM

## 2019-08-30 DIAGNOSIS — E1122 Type 2 diabetes mellitus with diabetic chronic kidney disease: Secondary | ICD-10-CM

## 2019-08-30 MED ORDER — TRELEGY ELLIPTA 100-62.5-25 MCG/INH IN AEPB: 1.0000 | INHALATION_SPRAY | Freq: Every day | RESPIRATORY_TRACT | 5 refills | Status: DC
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
## 2019-09-04 ENCOUNTER — Other Ambulatory Visit: Payer: Self-pay | Admitting: Family Medicine

## 2019-09-04 ENCOUNTER — Other Ambulatory Visit: Payer: Self-pay

## 2019-09-04 MED ORDER — ONETOUCH ULTRA VI STRP: ORAL_STRIP | 3 refills | Status: DC

## 2019-09-12 ENCOUNTER — Inpatient Hospital Stay (HOSPITAL_COMMUNITY): Admission: RE | Admit: 2019-09-12 | Payer: 59 | Source: Ambulatory Visit

## 2019-09-27 ENCOUNTER — Encounter (HOSPITAL_COMMUNITY): Payer: 59

## 2019-10-04 ENCOUNTER — Other Ambulatory Visit: Payer: Self-pay | Admitting: Family Medicine

## 2019-10-09 ENCOUNTER — Other Ambulatory Visit: Payer: Self-pay | Admitting: Family Medicine

## 2019-10-16 ENCOUNTER — Inpatient Hospital Stay (HOSPITAL_COMMUNITY): Admission: RE | Admit: 2019-10-16 | Payer: 59 | Source: Ambulatory Visit

## 2019-10-31 ENCOUNTER — Inpatient Hospital Stay (HOSPITAL_COMMUNITY): Admission: RE | Admit: 2019-10-31 | Payer: 59 | Source: Ambulatory Visit

## 2019-11-08 ENCOUNTER — Other Ambulatory Visit: Payer: Self-pay | Admitting: Family Medicine

## 2019-11-08 NOTE — Telephone Encounter (Signed)
Ok to refill?? ° °Last office visit 08/30/2019. ° °Last refill 07/10/2019, #3 refills.  °

## 2019-11-13 ENCOUNTER — Other Ambulatory Visit: Payer: Self-pay

## 2019-11-13 MED ORDER — ZOLPIDEM TARTRATE ER 12.5 MG PO TBCR: EXTENDED_RELEASE_TABLET | ORAL | 3 refills | Status: DC

## 2019-11-13 NOTE — Telephone Encounter (Signed)
Requested Prescriptions   Pending Prescriptions Disp Refills  . zolpidem (AMBIEN CR) 12.5 MG CR tablet 30 tablet 3    Sig: TAKE 1 TABLET(12.5 MG) BY MOUTH AT BEDTIME AS NEEDED FOR SLEEP    [facility] called and stated that all of pt's Rxs was sent from [facility] to [facility]. The only one they could not transfer was the Zolpidem. Can you send in new Rx to [facility] so they can keep it on file. Please advise.

## 2019-11-14 ENCOUNTER — Ambulatory Visit (HOSPITAL_COMMUNITY)
Admission: RE | Admit: 2019-11-14 | Payer: 59 | Source: Ambulatory Visit | Attending: Family Medicine | Admitting: Family Medicine

## 2019-11-15 ENCOUNTER — Telehealth: Payer: Self-pay | Admitting: Family Medicine

## 2019-11-15 MED ORDER — PRAVASTATIN SODIUM 40 MG PO TABS: ORAL_TABLET | ORAL | 1 refills | Status: DC

## 2019-11-15 MED ORDER — POTASSIUM CHLORIDE CRYS ER 20 MEQ PO TBCR: EXTENDED_RELEASE_TABLET | ORAL | 3 refills | Status: DC

## 2019-11-15 NOTE — Telephone Encounter (Signed)
Wife called requesting a prescription be sent to [facility] this is the new pharmacy for patient. He needs a 90 day supply on his potassium and pravastatin.  CB# [phone]

## 2019-11-15 NOTE — Telephone Encounter (Signed)
Medication called/sent to requested pharmacy  

## 2019-11-17 ENCOUNTER — Other Ambulatory Visit: Payer: Self-pay | Admitting: Family Medicine

## 2019-12-07 ENCOUNTER — Other Ambulatory Visit: Payer: Self-pay | Admitting: Family Medicine

## 2019-12-07 NOTE — Telephone Encounter (Signed)
Ok to refill??  Last office visit 08/30/2019.  Last refill 07/10/2019, #3 refills.

## 2020-01-22 ENCOUNTER — Other Ambulatory Visit: Payer: Self-pay | Admitting: *Deleted

## 2020-01-22 MED ORDER — ONETOUCH ULTRA VI STRP: ORAL_STRIP | 3 refills | Status: DC

## 2020-03-20 ENCOUNTER — Telehealth: Payer: Self-pay | Admitting: Family Medicine

## 2020-03-20 NOTE — Telephone Encounter (Signed)
Wife called states that [place] isn't sleeping wants to know if there is something else he could take and or increase dosage, she states he sleeps about 2-3 hours and then back awake. She states he is currently taking diazepam 30 minutes and zolpidem an hour prior to going to bed.   She also is asking if we could call in a prescription  freestyle libre continuous glucose monitor.so he doesn't have to prick his finger every day several times a day.  She states freestyle dexcom isn't covered by her insurance.  CB#  [phone]

## 2020-03-24 NOTE — Telephone Encounter (Signed)
Those are extremely high doss and I would not recommend doing something stronger due to risk of respiratory depression.

## 2020-03-25 ENCOUNTER — Other Ambulatory Visit: Payer: Self-pay

## 2020-03-25 MED ORDER — INSULIN LISPRO (1 UNIT DIAL) 100 UNIT/ML (KWIKPEN): PEN_INJECTOR | SUBCUTANEOUS | 3 refills | Status: DC

## 2020-03-25 MED ORDER — LANTUS SOLOSTAR 100 UNIT/ML ~~LOC~~ SOPN: PEN_INJECTOR | SUBCUTANEOUS | 3 refills | Status: DC

## 2020-03-25 NOTE — Telephone Encounter (Signed)
Spoke with pt's wife. Verbalizes understanding on the sleep meds issue. She would like to know about the freestyle libre mentioned in note from [facility]. Please advise.

## 2020-03-25 NOTE — Telephone Encounter (Signed)
I am fine with Dennis Vasquez.  Send in script.

## 2020-03-31 ENCOUNTER — Other Ambulatory Visit: Payer: Self-pay | Admitting: Family Medicine

## 2020-03-31 MED ORDER — ANORO ELLIPTA 62.5-25 MCG/INH IN AEPB: INHALATION_SPRAY | RESPIRATORY_TRACT | 5 refills | Status: DC

## 2020-03-31 NOTE — Telephone Encounter (Signed)
[name] called requesting refill on anoro ellipta called into [facility]  CB# [phone]

## 2020-03-31 NOTE — Telephone Encounter (Signed)
Prescription sent to pharmacy.

## 2020-04-03 MED ORDER — FREESTYLE LIBRE 14 DAY READER DEVI: 6 refills | Status: DC

## 2020-04-03 MED ORDER — FREESTYLE LIBRE 14 DAY SENSOR MISC: 3 refills | Status: DC

## 2020-04-03 NOTE — Telephone Encounter (Signed)
Prescription sent to pharmacy.

## 2020-04-10 ENCOUNTER — Other Ambulatory Visit: Payer: Self-pay | Admitting: Family Medicine

## 2020-04-10 NOTE — Telephone Encounter (Signed)
Ok to refill??  Last office visit 08/30/2019.  Last refill 11/13/2019, #3 refills.

## 2020-04-10 NOTE — Telephone Encounter (Signed)
Ok to refill??  Last office visit 08/30/2019.  Last refill 12/07/2019, #3 refills.

## 2020-05-26 ENCOUNTER — Other Ambulatory Visit: Payer: Self-pay | Admitting: Family Medicine

## 2020-07-07 ENCOUNTER — Other Ambulatory Visit: Payer: Self-pay | Admitting: Family Medicine

## 2020-07-07 DIAGNOSIS — I10 Essential (primary) hypertension: Secondary | ICD-10-CM

## 2020-08-06 ENCOUNTER — Telehealth: Payer: Self-pay | Admitting: Family Medicine

## 2020-08-06 NOTE — Telephone Encounter (Signed)
Pt wife called explaining that it would be hard to pt to get here to pick Handicap Placard info, and can it be mailed.  Called pt back to let them know that it will be mailed out.

## 2020-08-11 ENCOUNTER — Other Ambulatory Visit: Payer: Self-pay | Admitting: Family Medicine

## 2020-08-11 NOTE — Telephone Encounter (Signed)
Ok to refill??  Last office visit 08/30/2019.  Last refill 04/10/2020, #3 refills on both.

## 2020-08-25 ENCOUNTER — Other Ambulatory Visit: Payer: Self-pay | Admitting: Family Medicine

## 2020-09-24 ENCOUNTER — Other Ambulatory Visit: Payer: Self-pay | Admitting: Family Medicine

## 2020-10-02 ENCOUNTER — Other Ambulatory Visit: Payer: Self-pay | Admitting: Family Medicine

## 2020-10-20 ENCOUNTER — Encounter: Payer: Self-pay | Admitting: Family Medicine

## 2020-10-20 ENCOUNTER — Other Ambulatory Visit: Payer: Self-pay

## 2020-10-20 ENCOUNTER — Ambulatory Visit (INDEPENDENT_AMBULATORY_CARE_PROVIDER_SITE_OTHER): Payer: 59 | Admitting: Family Medicine

## 2020-10-20 VITALS — BP 144/82 | HR 70 | Temp 98.0°F | Resp 16 | Ht 75.0 in | Wt 323.0 lb

## 2020-10-20 DIAGNOSIS — I10 Essential (primary) hypertension: Secondary | ICD-10-CM

## 2020-10-20 DIAGNOSIS — E1122 Type 2 diabetes mellitus with diabetic chronic kidney disease: Secondary | ICD-10-CM | POA: Diagnosis not present

## 2020-10-20 DIAGNOSIS — Z125 Encounter for screening for malignant neoplasm of prostate: Secondary | ICD-10-CM | POA: Diagnosis not present

## 2020-10-20 DIAGNOSIS — E1169 Type 2 diabetes mellitus with other specified complication: Secondary | ICD-10-CM | POA: Diagnosis not present

## 2020-10-20 DIAGNOSIS — D472 Monoclonal gammopathy: Secondary | ICD-10-CM

## 2020-10-20 DIAGNOSIS — Z981 Arthrodesis status: Secondary | ICD-10-CM

## 2020-10-20 DIAGNOSIS — E785 Hyperlipidemia, unspecified: Secondary | ICD-10-CM

## 2020-10-20 DIAGNOSIS — Z0001 Encounter for general adult medical examination with abnormal findings: Secondary | ICD-10-CM

## 2020-10-20 DIAGNOSIS — R0600 Dyspnea, unspecified: Secondary | ICD-10-CM

## 2020-10-20 DIAGNOSIS — Z0000 Encounter for general adult medical examination without abnormal findings: Secondary | ICD-10-CM

## 2020-10-20 MED ORDER — NITROGLYCERIN 0.4 MG SL SUBL: 0.4000 mg | SUBLINGUAL_TABLET | SUBLINGUAL | 3 refills | Status: AC | PRN
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
## 2020-10-21 ENCOUNTER — Encounter: Payer: Self-pay | Admitting: Family Medicine

## 2020-10-21 DIAGNOSIS — K219 Gastro-esophageal reflux disease without esophagitis: Secondary | ICD-10-CM

## 2020-10-21 DIAGNOSIS — I10 Essential (primary) hypertension: Secondary | ICD-10-CM

## 2020-10-21 MED ORDER — PRAVASTATIN SODIUM 40 MG PO TABS: 40.0000 mg | ORAL_TABLET | Freq: Every day | ORAL | 1 refills | Status: DC

## 2020-10-21 MED ORDER — FUROSEMIDE 40 MG PO TABS: 40.0000 mg | ORAL_TABLET | Freq: Two times a day (BID) | ORAL | 1 refills | Status: DC

## 2020-10-21 MED ORDER — CLONIDINE HCL 0.3 MG PO TABS: 0.3000 mg | ORAL_TABLET | Freq: Three times a day (TID) | ORAL | 1 refills | Status: DC

## 2020-10-21 MED ORDER — OMEPRAZOLE 20 MG PO CPDR: 20.0000 mg | DELAYED_RELEASE_CAPSULE | Freq: Every day | ORAL | 1 refills | Status: DC

## 2020-10-21 MED ORDER — LORATADINE 10 MG PO TABS: 10.0000 mg | ORAL_TABLET | Freq: Every day | ORAL | 1 refills | Status: AC

## 2020-10-21 MED ORDER — POTASSIUM CHLORIDE CRYS ER 20 MEQ PO TBCR: EXTENDED_RELEASE_TABLET | ORAL | 1 refills | Status: DC

## 2020-10-21 NOTE — Telephone Encounter (Signed)
Ok to refill??  Last office visit  10/21/2020.  Last refill 09/01/2020 on both.

## 2020-10-22 ENCOUNTER — Ambulatory Visit
Admission: RE | Admit: 2020-10-22 | Discharge: 2020-10-22 | Disposition: A | Discharge status: Home / Self Care | Payer: 59 | Source: Ambulatory Visit | Attending: Family Medicine | Admitting: Family Medicine

## 2020-10-22 ENCOUNTER — Other Ambulatory Visit: Payer: Self-pay

## 2020-10-22 DIAGNOSIS — R0600 Dyspnea, unspecified: Secondary | ICD-10-CM

## 2020-10-22 LAB — CBC WITH DIFFERENTIAL/PLATELET
Absolute Monocytes: 683 cells/uL (ref 200–950)
Basophils Absolute: 40 cells/uL (ref 0–200)
Basophils Relative: 0.4 %
Eosinophils Absolute: 119 cells/uL (ref 15–500)
Eosinophils Relative: 1.2 %
HCT: 53.6 % — ABNORMAL HIGH (ref 38.5–50.0)
Hemoglobin: 17.5 g/dL — ABNORMAL HIGH (ref 13.2–17.1)
Lymphs Abs: 1584 cells/uL (ref 850–3900)
MCH: 29.7 pg (ref 27.0–33.0)
MCHC: 32.6 g/dL (ref 32.0–36.0)
MCV: 90.8 fL (ref 80.0–100.0)
MPV: 9.8 fL (ref 7.5–12.5)
Monocytes Relative: 6.9 %
Neutro Abs: 7475 cells/uL (ref 1500–7800)
Neutrophils Relative %: 75.5 %
Platelets: 272 10*3/uL (ref 140–400)
RBC: 5.9 10*6/uL — ABNORMAL HIGH (ref 4.20–5.80)
RDW: 13 % (ref 11.0–15.0)
Total Lymphocyte: 16 %
WBC: 9.9 10*3/uL (ref 3.8–10.8)

## 2020-10-22 LAB — COMPLETE METABOLIC PANEL WITH GFR
AG Ratio: 1.6 (calc) (ref 1.0–2.5)
ALT: 40 U/L (ref 9–46)
AST: 28 U/L (ref 10–35)
Albumin: 4.4 g/dL (ref 3.6–5.1)
Alkaline phosphatase (APISO): 65 U/L (ref 35–144)
BUN/Creatinine Ratio: 16 (calc) (ref 6–22)
BUN: 24 mg/dL (ref 7–25)
CO2: 26 mmol/L (ref 20–32)
Calcium: 9.3 mg/dL (ref 8.6–10.3)
Chloride: 103 mmol/L (ref 98–110)
Creat: 1.46 mg/dL — ABNORMAL HIGH (ref 0.70–1.18)
GFR, Est African American: 55 mL/min/{1.73_m2} — ABNORMAL LOW (ref >=60)
GFR, Est Non African American: 47 mL/min/{1.73_m2} — ABNORMAL LOW (ref >=60)
Globulin: 2.7 g/dL (calc) (ref 1.9–3.7)
Glucose, Bld: 216 mg/dL — ABNORMAL HIGH (ref 65–99)
Potassium: 4.2 mmol/L (ref 3.5–5.3)
Sodium: 142 mmol/L (ref 135–146)
Total Bilirubin: 0.6 mg/dL (ref 0.2–1.2)
Total Protein: 7.1 g/dL (ref 6.1–8.1)

## 2020-10-22 LAB — LIPID PANEL
Cholesterol: 163 mg/dL (ref <200)
HDL: 45 mg/dL (ref >=40)
LDL Cholesterol (Calc): 92 mg/dL (calc)
Non-HDL Cholesterol (Calc): 118 mg/dL (calc) (ref <130)
Total CHOL/HDL Ratio: 3.6 (calc) (ref <5.0)
Triglycerides: 166 mg/dL — ABNORMAL HIGH (ref <150)

## 2020-10-22 LAB — PROTEIN ELECTROPHORESIS, SERUM
Abnormal Protein Band1: 0.5 g/dL — ABNORMAL HIGH
Albumin ELP: 4.2 g/dL (ref 3.8–4.8)
Alpha 1: 0.3 g/dL (ref 0.2–0.3)
Alpha 2: 0.8 g/dL (ref 0.5–0.9)
Beta 2: 0.4 g/dL (ref 0.2–0.5)
Beta Globulin: 0.4 g/dL (ref 0.4–0.6)
Gamma Globulin: 0.9 g/dL (ref 0.8–1.7)
Total Protein: 7 g/dL (ref 6.1–8.1)

## 2020-10-22 LAB — HEMOGLOBIN A1C
Hgb A1c MFr Bld: 7.3 % of total Hgb — ABNORMAL HIGH (ref <5.7)
Mean Plasma Glucose: 163 mg/dL
eAG (mmol/L): 9 mmol/L

## 2020-10-22 LAB — PSA: PSA: 0.68 ng/mL (ref <=4.0)

## 2020-10-22 LAB — MICROALBUMIN, URINE: Microalb, Ur: 8.2 mg/dL

## 2020-10-22 MED ORDER — ZOLPIDEM TARTRATE ER 12.5 MG PO TBCR: 12.5000 mg | EXTENDED_RELEASE_TABLET | Freq: Every day | ORAL | 3 refills | Status: DC

## 2020-10-22 MED ORDER — DIAZEPAM 5 MG PO TABS: ORAL_TABLET | ORAL | 3 refills | Status: DC

## 2020-10-28 ENCOUNTER — Other Ambulatory Visit: Payer: Self-pay | Admitting: *Deleted

## 2020-10-28 MED ORDER — DAPAGLIFLOZIN PROPANEDIOL 10 MG PO TABS: 10.0000 mg | ORAL_TABLET | Freq: Every day | ORAL | 3 refills | Status: DC

## 2020-11-26 ENCOUNTER — Other Ambulatory Visit (HOSPITAL_COMMUNITY): Payer: 59

## 2020-12-24 ENCOUNTER — Other Ambulatory Visit (HOSPITAL_COMMUNITY): Payer: 59

## 2021-01-06 ENCOUNTER — Other Ambulatory Visit: Payer: Self-pay | Admitting: Family Medicine

## 2021-01-14 ENCOUNTER — Other Ambulatory Visit: Payer: Self-pay

## 2021-01-14 ENCOUNTER — Ambulatory Visit (HOSPITAL_COMMUNITY): Payer: 59 | Attending: Cardiology

## 2021-01-14 DIAGNOSIS — R0600 Dyspnea, unspecified: Secondary | ICD-10-CM | POA: Insufficient documentation

## 2021-01-14 LAB — ECHOCARDIOGRAM COMPLETE
Area-P 1/2: 3.97 cm2
S' Lateral: 3.5 cm

## 2021-01-14 MED ORDER — PERFLUTREN LIPID MICROSPHERE
1.0000 mL | INTRAVENOUS | Status: AC | PRN
  Administered 2021-01-14: 1 mL via INTRAVENOUS

## 2021-01-16 ENCOUNTER — Telehealth: Payer: Self-pay | Admitting: *Deleted

## 2021-01-16 NOTE — Telephone Encounter (Signed)
Call placed to patient wife to make aware.   States that she has been monitoring BP and HR for patient. States that he woke up in the middle of the night and stated he did not feel well, so she had home EKG done that showed sinus tachycardia. Wife could not remember HR for EKG.   Also states that he had x1 episode of mouth bleeding that has resolved.   States that she wanted to make PCP aware.

## 2021-01-19 NOTE — Telephone Encounter (Signed)
Call placed to patient. LMTRC.  

## 2021-01-19 NOTE — Telephone Encounter (Signed)
Received VM from patient wife, [name].   Reports that patient is having increased cough and fatigue.   Call placed to patient to advise OV required.

## 2021-01-22 ENCOUNTER — Other Ambulatory Visit: Payer: Self-pay | Admitting: Family Medicine

## 2021-02-28 ENCOUNTER — Other Ambulatory Visit: Payer: Self-pay

## 2021-02-28 ENCOUNTER — Emergency Department (HOSPITAL_COMMUNITY)
Admission: EM | Admit: 2021-02-28 | Discharge: 2021-02-28 | Disposition: A | Discharge status: Home / Self Care | Payer: 59 | Attending: Emergency Medicine | Admitting: Emergency Medicine

## 2021-02-28 ENCOUNTER — Encounter (HOSPITAL_COMMUNITY): Payer: Self-pay | Admitting: Emergency Medicine

## 2021-02-28 DIAGNOSIS — I10 Essential (primary) hypertension: Secondary | ICD-10-CM | POA: Diagnosis not present

## 2021-02-28 DIAGNOSIS — R110 Nausea: Secondary | ICD-10-CM | POA: Insufficient documentation

## 2021-02-28 DIAGNOSIS — Z794 Long term (current) use of insulin: Secondary | ICD-10-CM | POA: Insufficient documentation

## 2021-02-28 DIAGNOSIS — J449 Chronic obstructive pulmonary disease, unspecified: Secondary | ICD-10-CM | POA: Diagnosis not present

## 2021-02-28 DIAGNOSIS — E1169 Type 2 diabetes mellitus with other specified complication: Secondary | ICD-10-CM | POA: Insufficient documentation

## 2021-02-28 DIAGNOSIS — K029 Dental caries, unspecified: Secondary | ICD-10-CM | POA: Insufficient documentation

## 2021-02-28 DIAGNOSIS — Z7984 Long term (current) use of oral hypoglycemic drugs: Secondary | ICD-10-CM | POA: Insufficient documentation

## 2021-02-28 DIAGNOSIS — Z79899 Other long term (current) drug therapy: Secondary | ICD-10-CM | POA: Diagnosis not present

## 2021-02-28 DIAGNOSIS — K068 Other specified disorders of gingiva and edentulous alveolar ridge: Secondary | ICD-10-CM | POA: Insufficient documentation

## 2021-02-28 DIAGNOSIS — Z87891 Personal history of nicotine dependence: Secondary | ICD-10-CM | POA: Insufficient documentation

## 2021-02-28 DIAGNOSIS — J45909 Unspecified asthma, uncomplicated: Secondary | ICD-10-CM | POA: Diagnosis not present

## 2021-02-28 DIAGNOSIS — Z7982 Long term (current) use of aspirin: Secondary | ICD-10-CM | POA: Insufficient documentation

## 2021-02-28 DIAGNOSIS — Z7951 Long term (current) use of inhaled steroids: Secondary | ICD-10-CM | POA: Insufficient documentation

## 2021-02-28 DIAGNOSIS — I2510 Atherosclerotic heart disease of native coronary artery without angina pectoris: Secondary | ICD-10-CM | POA: Insufficient documentation

## 2021-02-28 LAB — CBC WITH DIFFERENTIAL/PLATELET
Abs Immature Granulocytes: 0.04 10*3/uL (ref 0.00–0.07)
Basophils Absolute: 0 10*3/uL (ref 0.0–0.1)
Basophils Relative: 0 %
Eosinophils Absolute: 0 10*3/uL (ref 0.0–0.5)
Eosinophils Relative: 0 %
HCT: 52.1 % — ABNORMAL HIGH (ref 39.0–52.0)
Hemoglobin: 17.4 g/dL — ABNORMAL HIGH (ref 13.0–17.0)
Immature Granulocytes: 0 %
Lymphocytes Relative: 7 %
Lymphs Abs: 1 10*3/uL (ref 0.7–4.0)
MCH: 30.3 pg (ref 26.0–34.0)
MCHC: 33.4 g/dL (ref 30.0–36.0)
MCV: 90.8 fL (ref 80.0–100.0)
Monocytes Absolute: 0.6 10*3/uL (ref 0.1–1.0)
Monocytes Relative: 4 %
Neutro Abs: 12.4 10*3/uL — ABNORMAL HIGH (ref 1.7–7.7)
Neutrophils Relative %: 89 %
Platelets: 272 10*3/uL (ref 150–400)
RBC: 5.74 MIL/uL (ref 4.22–5.81)
RDW: 13 % (ref 11.5–15.5)
WBC: 14 10*3/uL — ABNORMAL HIGH (ref 4.0–10.5)
nRBC: 0 % (ref 0.0–0.2)

## 2021-02-28 LAB — PROTIME-INR
INR: 1 (ref 0.8–1.2)
Prothrombin Time: 12.9 seconds (ref 11.4–15.2)

## 2021-02-28 MED ORDER — ONDANSETRON 4 MG PO TBDP
4.0000 mg | ORAL_TABLET | Freq: Once | ORAL | Status: AC
  Administered 2021-02-28: 4 mg via ORAL
  Filled 2021-02-28: qty 1

## 2021-02-28 NOTE — ED Provider Notes (Signed)
Emergency Medicine Provider Triage Evaluation Note  Dennis Vasquez , a 75 y.o. male  was evaluated in triage.  Pt complains of gum bleeding in the left upper gumline and states that he has not had any recent surgeries he states that his only anticoagulation/blood thinner is aspirin.  Has had 2 episodes of bleeding from his gums in the past 2 weeks.  He does have a history of multiple myeloma states he is on no medications for this.  Denies any significant easy bleeding or bruising elsewhere.  Some nausea thought to be from swallowing blood per patient.  Review of Systems  Positive: Gum bleeding Negative: Fever  Physical Exam  BP 140/85 (BP Location: Right Arm)   Pulse 99   Temp 98.7 F (37.1 C) (Oral)   Resp 16   SpO2 91%  Gen:   Awake, no distress   Resp:  Normal effort  MSK:   Moves extremities without difficulty  Other:  No active bleeding of the mouth.  There is some evidence of prior bleeding with dried blood on the mouth and some blood in the oral cavity.  Medical Decision Making  Medically screening exam initiated at 12:01 PM.  Appropriate orders placed.  Dennis Vasquez was informed that the remainder of the evaluation will be completed by another provider, this initial triage assessment does not replace that evaluation, and the importance of remaining in the ED until their evaluation is complete.  Will obtain LDH have low suspicion for hemolytic/TTP however given his gum bleeding I think is reasonable to evaluate for this.  He is not to be having any symptoms of anemia such as shortness of breath lightheadedness or dizziness.  Vital signs within normal limits although heart rates towards the upper limit.   [name], [place] 02/28/21 1207    Lacretia Leigh, MD 03/03/21 [phone]

## 2021-02-28 NOTE — ED Triage Notes (Signed)
Patient coming from home, coming in for 2 instance of mouth bleeding in 2 weeks. Believes it is coming from his gums. Took zofran prior to coming.

## 2021-02-28 NOTE — Discharge Instructions (Addendum)
Please follow-up with your primary care provider.  Please follow-up with a dentist for further management of your teeth.
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
## 2021-03-10 ENCOUNTER — Other Ambulatory Visit: Payer: Self-pay | Admitting: Family Medicine

## 2021-03-10 NOTE — Telephone Encounter (Signed)
Ok to refill??  Last office visit  10/20/2020.  Last refill 10/22/2020, #3 refills.

## 2021-04-07 ENCOUNTER — Other Ambulatory Visit: Payer: Self-pay | Admitting: Family Medicine

## 2021-04-07 NOTE — Telephone Encounter (Signed)
Ok to refill??  Last office visit 10/20/2020.  Last refill 10/22/2020, #3 refills

## 2021-04-13 ENCOUNTER — Other Ambulatory Visit: Payer: Self-pay | Admitting: Family Medicine

## 2021-06-10 ENCOUNTER — Other Ambulatory Visit: Payer: Self-pay | Admitting: Family Medicine

## 2021-06-16 ENCOUNTER — Encounter: Payer: Self-pay | Admitting: Family Medicine

## 2021-06-16 NOTE — Progress Notes (Signed)
Prior auth for dexcom sensor was submitted via covermymeds.  (Key: [id number])  Your information has been sent to [facility].

## 2021-06-16 NOTE — Progress Notes (Signed)
(  Key: [id number])  [employer] is reviewing your PA request. Typically an electronic response will be received within 24-72 hours. To check for an update later, open this request from your dashboard.  You may close this dialog and return to your dashboard to perform other tasks.

## 2021-06-17 NOTE — Progress Notes (Signed)
Dexcom has been denied by pt's insurance. New rx for [employer] sent to pharmacy.

## 2021-06-22 ENCOUNTER — Other Ambulatory Visit: Payer: Self-pay | Admitting: Family Medicine

## 2021-07-23 ENCOUNTER — Other Ambulatory Visit: Payer: Self-pay | Admitting: Family Medicine

## 2021-07-23 NOTE — Telephone Encounter (Signed)
Diazepam and ambien refill request. Last seen 10/20/2020, Diazepam last filled 04/17/2021, ambien last filled 06/22/2021.

## 2021-08-14 ENCOUNTER — Other Ambulatory Visit: Payer: Self-pay | Admitting: Family Medicine

## 2021-08-14 NOTE — Telephone Encounter (Signed)
LOV 10/20/20 Last refill: Diazepam  07/23/21, #60, 0 refills Zolpidem  07/23/21, #30, 0 refills  Please review, thanks!

## 2021-08-20 ENCOUNTER — Other Ambulatory Visit: Payer: Self-pay | Admitting: Family Medicine

## 2021-09-09 ENCOUNTER — Telehealth: Payer: Self-pay | Admitting: Family Medicine

## 2021-09-09 DIAGNOSIS — E1122 Type 2 diabetes mellitus with diabetic chronic kidney disease: Secondary | ICD-10-CM

## 2021-09-09 MED ORDER — ONETOUCH ULTRA 2 W/DEVICE KIT: 1.0000 | PACK | Freq: Four times a day (QID) | 1 refills | Status: DC

## 2021-09-09 NOTE — Telephone Encounter (Signed)
Spoke with patient's wife, she reports patient has been getting wide range of BG levels - from 66 to 300 - they think the meter may not be working correctly. She is asking for new order for One Touch Ultra 2. Advised rx will be sent. Patient has not been seen since 10/2020, advised her he needs OV to follow up for medication management and possibly labs. She states she will call back to schedule this.  ? ? ?

## 2021-09-09 NOTE — Telephone Encounter (Signed)
Retrieved voicemail message from patient's spouse [name]; returned your call. Requesting call back. Please advise at [phone]. ?

## 2021-09-16 ENCOUNTER — Other Ambulatory Visit: Payer: Self-pay | Admitting: Family Medicine

## 2021-09-16 DIAGNOSIS — I10 Essential (primary) hypertension: Secondary | ICD-10-CM

## 2021-09-16 NOTE — Telephone Encounter (Signed)
LOV 10/20/20 ?Last refill 08/17/21, #60, 0 refills ? ?Please review, thanks! ? ?

## 2021-09-21 ENCOUNTER — Other Ambulatory Visit: Payer: Self-pay | Admitting: Family Medicine

## 2021-10-07 ENCOUNTER — Other Ambulatory Visit: Payer: Self-pay | Admitting: Family Medicine

## 2021-10-08 ENCOUNTER — Other Ambulatory Visit: Payer: Self-pay | Admitting: Family Medicine

## 2021-10-08 ENCOUNTER — Telehealth: Payer: Self-pay

## 2021-10-08 DIAGNOSIS — E1122 Type 2 diabetes mellitus with diabetic chronic kidney disease: Secondary | ICD-10-CM

## 2021-10-08 MED ORDER — ONETOUCH ULTRA VI STRP: 1.0000 | ORAL_STRIP | Freq: Every day | 3 refills | Status: DC

## 2021-10-08 MED ORDER — ONETOUCH ULTRA VI STRP: 1.0000 | ORAL_STRIP | Freq: Four times a day (QID) | 3 refills | Status: DC

## 2021-10-08 NOTE — Telephone Encounter (Signed)
Refill sent for 4 times daily.  ? ?Pt needs OV for further refills. Please contact to schedule.  ?

## 2021-10-08 NOTE — Telephone Encounter (Signed)
Spoke with wife, per pharmacy stated that her insulin test strips was not cover per her insurance due to the way the Sig was written. If you can please resend with a the Sig to reflect 5 test strips/day. This way it will be covered by her insurance.  ? ?Pls advise ? ? ?

## 2021-10-08 NOTE — Telephone Encounter (Signed)
Pharmacy called per pt would like to know if could get a new order for his BS One Touch Moitor testing be written for 5x's testing/day vs. 4x's due to insurance. Plus pt has been testing for more times and pt runs of strips faster now.  ? ?Pls advise ?

## 2021-10-09 ENCOUNTER — Other Ambulatory Visit: Payer: Self-pay | Admitting: Family Medicine

## 2021-10-15 ENCOUNTER — Telehealth: Payer: Self-pay | Admitting: Family Medicine

## 2021-10-15 NOTE — Telephone Encounter (Signed)
Left message for patient to call back and schedule Medicare Annual Wellness Visit (AWV) in office.  ? ?If not able to come in office, please offer to do virtually or by telephone.  Left office number and my jabber [phone]. ? ?Last AWV:10/20/2020 ? ?Please schedule at anytime with Nurse Health Advisor. ?  ?

## 2021-10-19 ENCOUNTER — Other Ambulatory Visit: Payer: Self-pay | Admitting: Family Medicine

## 2021-10-19 NOTE — Telephone Encounter (Signed)
LOV 10/20/20 ?Last refill 09/17/21, #60, 0 refills ? ?Please review, thanks! ? ?

## 2021-10-23 NOTE — Telephone Encounter (Signed)
Please to schedule appt, please. Thank you

## 2021-10-29 ENCOUNTER — Ambulatory Visit: Payer: 59

## 2021-10-30 NOTE — Telephone Encounter (Signed)
Please advice  

## 2021-11-02 NOTE — Telephone Encounter (Signed)
Please call pt in for OV  ?

## 2021-11-03 NOTE — Telephone Encounter (Signed)
Left vm asking for patient to call back so we can schedule him an appt.  ?

## 2021-11-03 NOTE — Telephone Encounter (Signed)
Called pt and lvm to call office to schedule appt. ?

## 2021-11-09 ENCOUNTER — Telehealth: Payer: Self-pay | Admitting: Family Medicine

## 2021-11-09 NOTE — Telephone Encounter (Signed)
FYI patients wife wanted you to know the following information before patients appointment on 11/20/2021.Wife [name] states patient is starting to show more signs of remembering things, he is complaining of abdominal pain, and she states his sugars are all over the place from 70-300. She states that he is telling her the insulin isn't working her and their son is trying to get the patient to write down what he is eating and reassure him that the insulin is working. ? ?CB# [phone]  ?

## 2021-11-12 NOTE — Telephone Encounter (Signed)
Patient is scheduled for 11/20/21 ?

## 2021-11-13 ENCOUNTER — Other Ambulatory Visit: Payer: Self-pay | Admitting: Family Medicine

## 2021-11-16 NOTE — Telephone Encounter (Signed)
Pharmacy called in to check on status of this refill and stated that they will possibly have to do an emergency refill as pt is out of this med.  ?

## 2021-11-16 NOTE — Telephone Encounter (Signed)
LOV 10/20/20, OV scheduled 11/20/21 ? ?Last refill 10/19/21, #60, 0 refills ? ?Please review, thanks! ? ?

## 2021-11-16 NOTE — Telephone Encounter (Signed)
Requested medication (s) are due for refill today: yes ? ?Requested medication (s) are on the active medication list: yes ? ?Last refill:  potassium chloride: 10/21/20 #180 1 RF      Diazepam: 10/19/21 ? ?Future visit scheduled: yes ? ?Notes to clinic:  needs labs  for the Klor-Con ?Diazepam not delegated to NT to RF ? ? ?Requested Prescriptions  ?Pending Prescriptions Disp Refills  ? potassium chloride SA (KLOR-CON M) 20 MEQ tablet [Pharmacy Med Name: POTASSIUM CHLORIDE CRYS ER 20 MEQ T] 180 tablet 1  ?  Sig: TAKE 1 TABLET BY MOUTH TWICE A DAY  ?  ? Endocrinology:  Minerals - Potassium Supplementation Failed - 11/16/2021 11:11 AM  ?  ?  Failed - K in normal range and within 360 days  ?  Potassium  ?Date Value Ref Range Status  ?10/20/2020 4.2 3.5 - 5.3 mmol/L Final  ?12/25/2015 3.8 3.5 - 5.1 mEq/L Final  ?   ?  ?  Failed - Cr in normal range and within 360 days  ?  Creatinine  ?Date Value Ref Range Status  ?12/25/2015 1.4 (H) 0.7 - 1.3 mg/dL Final  ? ?Creat  ?Date Value Ref Range Status  ?10/20/2020 1.46 (H) 0.70 - 1.18 mg/dL Final  ?  Comment:  ?  For patients >1 years of age, the reference limit ?for Creatinine is approximately 13% higher for people ?identified as African-American. ?. ?  ? ?Creatinine,U  ?Date Value Ref Range Status  ?03/17/2009 155.8 mg/dL Final  ?   ?  ?  Failed - Valid encounter within last 12 months  ?  Recent Outpatient Visits   ? ?      ? 1 year ago General medical exam  ? [facility] Dennard Schaumann Cammie Mcgee, MD  ? 2 years ago General medical exam  ? [facility] Susy Frizzle, MD  ? 3 years ago Shortness of breath  ? [facility] Pickard, Cammie Mcgee, MD  ? 3 years ago Shortness of breath  ? [facility] Pickard, Cammie Mcgee, MD  ? 3 years ago Chronic fatigue  ? [facility] Pickard, Cammie Mcgee, MD  ? ?  ?  ?Future Appointments   ? ?        ? In 2 weeks Pickard, Cammie Mcgee, MD [place]  ? ?  ? ? ?   ?  ?  ? diazepam (VALIUM) 5 MG tablet [Pharmacy Med Name: DIAZEPAM 5 MG TAB] 60 tablet   ?  Sig: TAKE 1 TABLET BY MOUTH EVERY 6 HOURS AS NEEDED FOR ANXIETY. DO NOT TAKE WITH AMBIEN  ?  ? Not Delegated - Psychiatry: Anxiolytics/Hypnotics 2 Failed - 11/16/2021 11:11 AM  ?  ?  Failed - This refill cannot be delegated  ?  ?  Failed - Urine Drug Screen completed in last 360 days  ?  ?  Failed - Valid encounter within last 6 months  ?  Recent Outpatient Visits   ? ?      ? 1 year ago General medical exam  ? [facility] Dennard Schaumann Cammie Mcgee, MD  ? 2 years ago General medical exam  ? [facility] Susy Frizzle, MD  ? 3 years ago Shortness of breath  ? [facility] Pickard, Cammie Mcgee, MD  ? 3 years ago Shortness of breath  ? [place] Pickard, Cammie Mcgee, MD  ?  3 years ago Chronic fatigue  ? [facility] Pickard, Cammie Mcgee, MD  ? ?  ?  ?Future Appointments   ? ?        ? In 2 weeks Pickard, Cammie Mcgee, MD [place]  ? ?  ? ? ?  ?  ?  Passed - Patient is not pregnant  ?  ?  ? ? ? ? ?

## 2021-11-19 ENCOUNTER — Ambulatory Visit: Payer: 59

## 2021-11-20 ENCOUNTER — Ambulatory Visit: Payer: 59 | Admitting: Family Medicine

## 2021-11-24 ENCOUNTER — Other Ambulatory Visit: Payer: Self-pay | Admitting: Family Medicine

## 2021-11-25 NOTE — Telephone Encounter (Signed)
Requested medication (s) are due for refill today: expired medication  Requested medication (s) are on the active medication list: yes   Last refill:  10/21/20 #90 1 refills  Future visit scheduled: yes in 6 days   Notes to clinic:  expired medication. Do you want to renew Rx?     Requested Prescriptions  Pending Prescriptions Disp Refills   pravastatin (PRAVACHOL) 40 MG tablet [Pharmacy Med Name: PRAVASTATIN SODIUM 40 MG TAB] 90 tablet 1    Sig: TAKE 1 TABLET BY MOUTH ONCE DAILY     Cardiovascular:  Antilipid - Statins Failed - 11/24/2021  4:47 PM      Failed - Valid encounter within last 12 months    Recent Outpatient Visits           1 year ago General medical exam   [place] Susy Frizzle, MD   2 years ago General medical exam   [place] Susy Frizzle, MD   3 years ago Shortness of breath   [place], Cammie Mcgee, MD   3 years ago Shortness of breath   [place] [facility], Cammie Mcgee, MD   3 years ago Chronic fatigue   [place], Cammie Mcgee, MD       Future Appointments             In 6 days Susy Frizzle, MD [place]             Failed - Lipid Panel in normal range within the last 12 months    Cholesterol  Date Value Ref Range Status  10/20/2020 163 <200 mg/dL Final   LDL Cholesterol (Calc)  Date Value Ref Range Status  10/20/2020 92 mg/dL (calc) Final    Comment:    Reference range: <100 . Desirable range <100 mg/dL for primary prevention;   <70 mg/dL for patients with CHD or diabetic patients  with > or = 2 CHD risk factors. Marland Kitchen LDL-C is now calculated using the Martin-Hopkins  calculation, which is a validated novel method providing  better accuracy than the Friedewald equation in the  estimation of LDL-C.  Cresenciano Genre et al. Annamaria Helling. 4765;465(03): 2061-2068  (http://education.[facility].com/faq/FAQ164)     HDL  Date Value Ref Range Status  10/20/2020 45 > OR = 40 mg/dL Final   Triglycerides  Date Value Ref Range Status  10/20/2020 166 (H) <150 mg/dL Final         Passed - Patient is not pregnant

## 2021-11-27 ENCOUNTER — Ambulatory Visit: Payer: 59

## 2021-12-01 ENCOUNTER — Encounter: Payer: Self-pay | Admitting: Family Medicine

## 2021-12-01 ENCOUNTER — Ambulatory Visit (INDEPENDENT_AMBULATORY_CARE_PROVIDER_SITE_OTHER): Payer: 59 | Admitting: Family Medicine

## 2021-12-01 VITALS — BP 132/96 | HR 94 | Temp 97.6°F | Ht 75.0 in | Wt 316.6 lb

## 2021-12-01 DIAGNOSIS — E1122 Type 2 diabetes mellitus with diabetic chronic kidney disease: Secondary | ICD-10-CM | POA: Diagnosis not present

## 2021-12-01 DIAGNOSIS — R531 Weakness: Secondary | ICD-10-CM

## 2021-12-01 DIAGNOSIS — R0600 Dyspnea, unspecified: Secondary | ICD-10-CM

## 2021-12-01 DIAGNOSIS — I10 Essential (primary) hypertension: Secondary | ICD-10-CM

## 2021-12-01 DIAGNOSIS — Z981 Arthrodesis status: Secondary | ICD-10-CM
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
## 2021-12-02 ENCOUNTER — Other Ambulatory Visit: Payer: Self-pay | Admitting: Family Medicine

## 2021-12-02 ENCOUNTER — Encounter: Payer: Self-pay | Admitting: Family Medicine

## 2021-12-03 ENCOUNTER — Telehealth: Payer: Self-pay

## 2021-12-03 ENCOUNTER — Other Ambulatory Visit: Payer: Self-pay | Admitting: Family Medicine

## 2021-12-03 ENCOUNTER — Other Ambulatory Visit: Payer: 59

## 2021-12-03 MED ORDER — AZITHROMYCIN 250 MG PO TABS: ORAL_TABLET | ORAL | 0 refills | Status: DC

## 2021-12-03 NOTE — Telephone Encounter (Signed)
[name] called for pt said that pt is having some coughing and congestion , no fever , sore throat, headache, some ear pain . She wanted to know if he can  can antibiotic . She said that he was given  home covid test and was neg.  She said that hard for him to come out to the office if possible she would like something sent to pharmacy . Tried call [name] to find if she giving him anything now , had to lvm.

## 2021-12-03 NOTE — Telephone Encounter (Signed)
-----   Message from Susy Frizzle, MD sent at 12/03/2021  6:59 AM EDT ----- A1c is elevated, I would add ozempic 0.5 mg sq weekly to help lower sugar and weight loss.  Recheck labs in 3 months.  Other labs look good. Recommend sleep study given high hemoglobin which is suspicious for him not getting enough oxygen.  Be glad to schedule if he agrees. If he has sleep apnea, treating would help him feel better.

## 2021-12-03 NOTE — Telephone Encounter (Signed)
Requested Prescriptions  Pending Prescriptions Disp Refills  . pravastatin (PRAVACHOL) 40 MG tablet [Pharmacy Med Name: PRAVASTATIN SODIUM 40 MG TAB] 90 tablet 0    Sig: TAKE 1 TABLET BY MOUTH ONCE DAILY (NEED APPT FOR FUTURE REFILLS)     Cardiovascular:  Antilipid - Statins Failed - 12/02/2021  8:49 AM      Failed - Lipid Panel in normal range within the last 12 months    Cholesterol  Date Value Ref Range Status  12/01/2021 166 <200 mg/dL Final   LDL Cholesterol (Calc)  Date Value Ref Range Status  12/01/2021 82 mg/dL (calc) Final    Comment:    Reference range: <100 . Desirable range <100 mg/dL for primary prevention;   <70 mg/dL for patients with CHD or diabetic patients  with > or = 2 CHD risk factors. Marland Kitchen LDL-C is now calculated using the Martin-Hopkins  calculation, which is a validated novel method providing  better accuracy than the Friedewald equation in the  estimation of LDL-C.  Cresenciano Genre et al. Annamaria Helling. 2409;735(32): 2061-2068  (http://education.[facility].com/faq/FAQ164)    HDL  Date Value Ref Range Status  12/01/2021 53 > OR = 40 mg/dL Final   Triglycerides  Date Value Ref Range Status  12/01/2021 213 (H) <150 mg/dL Final    Comment:    . If a non-fasting specimen was collected, consider repeat triglyceride testing on a fasting specimen if clinically indicated.  Yates Decamp et al. J. of Clin. Lipidol. 9924;2:683-419. Marland Kitchen          Passed - Patient is not pregnant      Passed - Valid encounter within last 12 months    Recent Outpatient Visits          2 days ago Controlled type 2 diabetes mellitus with chronic kidney disease, unspecified CKD stage, unspecified whether long term insulin use ([place])   [place] Pick[facility], Cammie Mcgee, MD   1 year ago General medical exam   [place] Susy Frizzle, MD   2 years ago General medical exam   [place] Susy Frizzle, MD   3 years ago Shortness of  breath   [place], Cammie Mcgee, MD   3 years ago Shortness of breath   [place] Pickard, Cammie Mcgee, MD

## 2021-12-03 NOTE — Telephone Encounter (Signed)
I left a message for the patient to return my call.

## 2021-12-04 ENCOUNTER — Ambulatory Visit: Payer: 59

## 2021-12-04 LAB — CBC WITH DIFFERENTIAL/PLATELET
Absolute Monocytes: 630 cells/uL (ref 200–950)
Basophils Absolute: 50 cells/uL (ref 0–200)
Basophils Relative: 0.5 %
Eosinophils Absolute: 70 cells/uL (ref 15–500)
Eosinophils Relative: 0.7 %
HCT: 53.1 % — ABNORMAL HIGH (ref 38.5–50.0)
Hemoglobin: 17.9 g/dL — ABNORMAL HIGH (ref 13.2–17.1)
Lymphs Abs: 1520 cells/uL (ref 850–3900)
MCH: 30.7 pg (ref 27.0–33.0)
MCHC: 33.7 g/dL (ref 32.0–36.0)
MCV: 91.1 fL (ref 80.0–100.0)
MPV: 9.5 fL (ref 7.5–12.5)
Monocytes Relative: 6.3 %
Neutro Abs: 7730 cells/uL (ref 1500–7800)
Neutrophils Relative %: 77.3 %
Platelets: 293 10*3/uL (ref 140–400)
RBC: 5.83 10*6/uL — ABNORMAL HIGH (ref 4.20–5.80)
RDW: 12.6 % (ref 11.0–15.0)
Total Lymphocyte: 15.2 %
WBC: 10 10*3/uL (ref 3.8–10.8)

## 2021-12-04 LAB — MICROALBUMIN, URINE: Microalb, Ur: 1.1 mg/dL

## 2021-12-04 LAB — COMPLETE METABOLIC PANEL WITH GFR
AG Ratio: 1.8 (calc) (ref 1.0–2.5)
ALT: 21 U/L (ref 9–46)
AST: 16 U/L (ref 10–35)
Albumin: 4.4 g/dL (ref 3.6–5.1)
Alkaline phosphatase (APISO): 66 U/L (ref 35–144)
BUN: 13 mg/dL (ref 7–25)
CO2: 26 mmol/L (ref 20–32)
Calcium: 9.7 mg/dL (ref 8.6–10.3)
Chloride: 100 mmol/L (ref 98–110)
Creat: 1.25 mg/dL (ref 0.70–1.28)
Globulin: 2.5 g/dL (calc) (ref 1.9–3.7)
Glucose, Bld: 324 mg/dL — ABNORMAL HIGH (ref 65–99)
Potassium: 4.5 mmol/L (ref 3.5–5.3)
Sodium: 139 mmol/L (ref 135–146)
Total Bilirubin: 0.6 mg/dL (ref 0.2–1.2)
Total Protein: 6.9 g/dL (ref 6.1–8.1)
eGFR: 61 mL/min/{1.73_m2} (ref >=60)

## 2021-12-04 LAB — LIPID PANEL
Cholesterol: 166 mg/dL (ref <200)
HDL: 53 mg/dL (ref >=40)
LDL Cholesterol (Calc): 82 mg/dL (calc)
Non-HDL Cholesterol (Calc): 113 mg/dL (calc) (ref <130)
Total CHOL/HDL Ratio: 3.1 (calc) (ref <5.0)
Triglycerides: 213 mg/dL — ABNORMAL HIGH (ref <150)

## 2021-12-04 LAB — HEMOGLOBIN A1C
Hgb A1c MFr Bld: 8.3 % of total Hgb — ABNORMAL HIGH (ref <5.7)
Mean Plasma Glucose: 192 mg/dL
eAG (mmol/L): 10.6 mmol/L

## 2021-12-04 NOTE — Telephone Encounter (Signed)
Called and notified pt that his was sent to the pharmacy ask pt to call us back to confirm that  he has got  zpac

## 2021-12-14 ENCOUNTER — Other Ambulatory Visit: Payer: Self-pay | Admitting: Family Medicine

## 2021-12-14 DIAGNOSIS — I10 Essential (primary) hypertension: Secondary | ICD-10-CM

## 2021-12-15 NOTE — Telephone Encounter (Signed)
Requested Prescriptions  Pending Prescriptions Disp Refills  . cloNIDine (CATAPRES) 0.3 MG tablet [Pharmacy Med Name: CLONIDINE HCL 0.3 MG TAB] 270 tablet 1    Sig: TAKE 1 TABLET BY MOUTH 3 TIMES DAILY WILL NEED OFFICE VISIT FOR FURTHER REFILLS     Cardiovascular:  Alpha-2 Agonists Failed - 12/15/2021 10:50 AM      Failed - Last BP in normal range    BP Readings from Last 1 Encounters:  12/01/21 (!) 132/96         Passed - Last Heart Rate in normal range    Pulse Readings from Last 1 Encounters:  12/01/21 94         Passed - Valid encounter within last 6 months    Recent Outpatient Visits          2 weeks ago Controlled type 2 diabetes mellitus with chronic kidney disease, unspecified CKD stage, unspecified whether long term insulin use ([place])   [place] Pickard, Cammie Mcgee, MD   1 year ago General medical exam   [place] Susy Frizzle, MD   2 years ago General medical exam   [place] Susy Frizzle, MD   3 years ago Shortness of breath   [place] Schaumann, Cammie Mcgee, MD   3 years ago Shortness of breath   [place] Pickard, Cammie Mcgee, MD

## 2021-12-16 ENCOUNTER — Other Ambulatory Visit: Payer: Self-pay | Admitting: Family Medicine

## 2021-12-16 NOTE — Telephone Encounter (Signed)
Requested medication (s) are due for refill today:yes  Requested medication (s) are on the active medication list: yes    Last refill: Diazepam  11/16/21  #60  0 refills    Ambien   08/17/21  # 30  3 refills  Future visit scheduled yes 12/17/21  Notes to clinic: Not delegated, please review. Thank you.  Requested Prescriptions  Pending Prescriptions Disp Refills   diazepam (VALIUM) 5 MG tablet [Pharmacy Med Name: DIAZEPAM 5 MG TAB] 60 tablet     Sig: TAKE 1 TABLET BY MOUTH EVERY 6 HOURS AS NEEDED FOR ANXIETY. DO NOT TAKE WITH AMBIEN     Not Delegated - Psychiatry: Anxiolytics/Hypnotics 2 Failed - 12/16/2021 12:20 PM      Failed - This refill cannot be delegated      Failed - Urine Drug Screen completed in last 360 days      Passed - Patient is not pregnant      Passed - Valid encounter within last 6 months    Recent Outpatient Visits           2 weeks ago Controlled type 2 diabetes mellitus with chronic kidney disease, unspecified CKD stage, unspecified whether long term insulin use ([place])   [place] Pickard, Cammie Mcgee, MD   1 year ago General medical exam   [place] Susy Frizzle, MD   2 years ago General medical exam   [place] Susy Frizzle, MD   3 years ago Shortness of breath   [place] [facility] Schaumann, Cammie Mcgee, MD   3 years ago Shortness of breath   [place] Pickard, Cammie Mcgee, MD               zolpidem (AMBIEN CR) 12.5 MG CR tablet [Pharmacy Med Name: ZOLPIDEM TARTRATE ER 12.5 MG TAB] 30 tablet     Sig: TAKE 1 TABLET BY MOUTH EVERY NIGHT AT BEDTIME     Not Delegated - Psychiatry:  Anxiolytics/Hypnotics Failed - 12/16/2021 12:20 PM      Failed - This refill cannot be delegated      Failed - Urine Drug Screen completed in last 360 days      Passed - Valid encounter within last 6 months    Recent Outpatient Visits           2 weeks ago Controlled type 2 diabetes  mellitus with chronic kidney disease, unspecified CKD stage, unspecified whether long term insulin use ([place])   [place] Pickard, Cammie Mcgee, MD   1 year ago General medical exam   [place] Susy Frizzle, MD   2 years ago General medical exam   [place], Warren T, MD   3 years ago Shortness of breath   [place], Cammie Mcgee, MD   3 years ago Shortness of breath   [place] Pickard, Cammie Mcgee, MD

## 2021-12-17 ENCOUNTER — Ambulatory Visit (INDEPENDENT_AMBULATORY_CARE_PROVIDER_SITE_OTHER): Payer: 59 | Admitting: Family Medicine

## 2021-12-17 ENCOUNTER — Other Ambulatory Visit: Payer: Self-pay | Admitting: Family Medicine

## 2021-12-17 DIAGNOSIS — R0681 Apnea, not elsewhere classified: Secondary | ICD-10-CM

## 2021-12-17 DIAGNOSIS — E11649 Type 2 diabetes mellitus with hypoglycemia without coma: Secondary | ICD-10-CM | POA: Diagnosis not present

## 2021-12-17 DIAGNOSIS — R413 Other amnesia: Secondary | ICD-10-CM

## 2021-12-17 DIAGNOSIS — F321 Major depressive disorder, single episode, moderate: Secondary | ICD-10-CM

## 2021-12-17 MED ORDER — ESCITALOPRAM OXALATE 10 MG PO TABS: 10.0000 mg | ORAL_TABLET | Freq: Every day | ORAL | 5 refills | Status: DC

## 2021-12-17 NOTE — Progress Notes (Signed)
Subjective:    Patient ID: Dennis Vasquez, male    DOB: 24-Aug-1945, 76 y.o.   MRN: [id number]  HPI  Today's visit is being conducted as a telephone visit.  Visit is being conducted with the patient's wife, [name].  Phone call starts at 1019.  Phone call concluded at 1036.  Patient's wife requested a phone consultation.  Patient and his wife are both at home.  I am in my office.  Patient refuses to come to the office again.  Please see my last office visit with this patient.  Prior to his office visit, the patient's wife was concerned about memory loss, depression, confusion, and overall declining health.  However at the last office visit, the patient was in total denial.  He denied any memory loss.  He denied any worsening shortness of breath, worsening deconditioning, weakness.  He denied any confusion.  He denied any depression.  He refused any changes in his medication.  I have recommended trying a sleep study because I feel that he has sleep apnea which due to hypoxia could be causing memory loss and fatigue.  Also recommended trying Aricept to delay progression of memory loss based on the description his wife and provided to me.  Also recommended trying a GLP-1 agonist to better manage his sugars and facilitate weight loss which may help with his deconditioning and back pain.  The patient became very upset and refused all of his.  Therefore the patient's wife wanted to speak with me privately.  She states that she feels that the patient is in the mild that he has given up.  For instance she states that his sugars are highly variable.  Yesterday it was as low as 60 and as high as 300.  He will not eat healthy.  Instead he drank half a gallon of orange juice and take 2 candy bars.  He has managed his sugars for 25 years.  He knows better than to do this however he is showing poor executive function and essentially eating what ever he wants knowing this, blood sugars up.  He has a dog that has been  diagnosed with cancer.  With being put down tomorrow.  However they had to tell him twice that this is happening and the patient has no memory of this.  When he finds out this time he becomes extremely emotional.  He begins screaming.  He refuses to talk about.  He seems to show very little insight.  He is showing poor decision-making regarding his diet.  He refuses to get up and walk.  He refuses to "try".  He has become very emotional and aggravated and angry.  He also is having poor short-term memory.  Wife states that yesterday he had episodes of fecal incontinence where he made a mess in the bathroom.  However he has no memory of that and does not recollect that happening.  She also is concerned that she witnessed him having an apneic episode.  He was reclining sleeping in his chair with his mouth open however he was not making any sound.  His mouth was open.  There was no snoring.  His legs began to pick sporadically like myoclonic jerks.  She was concerned that he is having a seizure.  However he refuses any work-up or any treatment  Review of Systems     Objective:   Physical Exam        Assessment & Plan:  Depression, major, single episode, moderate ([place])  Memory loss  Apnea  Uncontrolled type 2 diabetes mellitus with hypoglycemia without coma [facility]) Patient was not involved in this telephone conversation because his wife knows that he will become very upset and emotional and even belligerent.  She does not know what to do next.  I feel that there are several things at play here.  First I feel that he is depressed.  I believe that this is evidenced by the fact he is given up.  He is not even trying to improve his situation.  He is also extremely emotional and very angry and having outbursts of yelling that is out of character for the patient.  He is also showing poor decision-making regarding his dietary indiscretion.  I also believe that he is demonstrating signs of short-term memory  loss and confusion may be signs of underlying dementia and is likely exacerbated by poorly treated sleep apnea and cerebral hypoxia.  Therefore I recommended partially treated depression.  I will start Lexapro 10 mg daily.  Wife believes that she could convince him to try this.  I hope that with this, the patient will become more willing to proceed with further work-up and treatment.  If he is I would recommend an MRI of the brain given the personality changes and memory loss to evaluate for any evidence of stroke, etc.  I will start him on Aricept due to short-term memory loss to delay progression and I would recommend a sleep study to evaluate and likely treat severe uncontrolled sleep apnea and hypoxia.  I believe that these things will help his overall "mental health".  Then perhaps we can better control his diabetes.  Recheck in 3 to 4 weeks.

## 2021-12-17 NOTE — Telephone Encounter (Signed)
Valium LOV 12/17/21 Last refill 11/16/21, #60, 0 refills  LOV 12/18/21 Last refill 08/17/21, #30, 3 refills  Please review, thanks!    Please review, thanks!

## 2022-01-04 ENCOUNTER — Other Ambulatory Visit: Payer: Self-pay | Admitting: Family Medicine

## 2022-01-06 NOTE — Telephone Encounter (Signed)
Requested Prescriptions  Pending Prescriptions Disp Refills  . furosemide (LASIX) 40 MG tablet [Pharmacy Med Name: FUROSEMIDE 40 MG TAB] 180 tablet 1    Sig: TAKE 1 TABLET BY MOUTH TWICE A DAY     Cardiovascular:  Diuretics - Loop Failed - 01/04/2022  9:52 AM      Failed - Mg Level in normal range and within 180 days    No results found for: "MG"       Failed - Last BP in normal range    BP Readings from Last 1 Encounters:  12/01/21 (!) 132/96         Passed - K in normal range and within 180 days    Potassium  Date Value Ref Range Status  12/01/2021 4.5 3.5 - 5.3 mmol/L Final  12/25/2015 3.8 3.5 - 5.1 mEq/L Final         Passed - Ca in normal range and within 180 days    Calcium  Date Value Ref Range Status  12/01/2021 9.7 8.6 - 10.3 mg/dL Final  12/25/2015 9.0 8.4 - 10.4 mg/dL Final         Passed - Na in normal range and within 180 days    Sodium  Date Value Ref Range Status  12/01/2021 139 135 - 146 mmol/L Final  12/25/2015 141 136 - 145 mEq/L Final         Passed - Cr in normal range and within 180 days    Creatinine  Date Value Ref Range Status  12/25/2015 1.4 (H) 0.7 - 1.3 mg/dL Final   Creat  Date Value Ref Range Status  12/01/2021 1.25 0.70 - 1.28 mg/dL Final   Creatinine,U  Date Value Ref Range Status  03/17/2009 155.8 mg/dL Final         Passed - Cl in normal range and within 180 days    Chloride  Date Value Ref Range Status  12/01/2021 100 98 - 110 mmol/L Final  12/25/2015 105 98 - 109 mEq/L Final         Passed - Valid encounter within last 6 months    Recent Outpatient Visits          1 month ago Controlled type 2 diabetes mellitus with chronic kidney disease, unspecified CKD stage, unspecified whether long term insulin use ([place])   [place] [facility], Cammie Mcgee, MD   1 year ago General medical exam   [facility] Susy Frizzle, MD   2 years ago General medical exam   [place],  Warren T, MD   3 years ago Shortness of breath   [place], Warren T, MD   3 years ago Shortness of breath   [place] [facility]ard, Cammie Mcgee, MD

## 2022-01-07 ENCOUNTER — Telehealth: Payer: Self-pay

## 2022-01-07 ENCOUNTER — Encounter: Payer: Self-pay | Admitting: Family Medicine

## 2022-01-07 NOTE — Telephone Encounter (Signed)
Pt's wife called, stating that she have notice a red/irritating spot in between his groin area, per wife it looks like a hole, per wife, looks like its been spreading..with no pain or itch.  2.)Per wife, stated that she also notice that pt is starting to forget and remembering any memories ie anniversary date. Per wife Pt also statd he is worring that he is not remembering   3.) per wife, stated that pt has been really depress lately, wife feels that since their Dog, has been putdown, pt has been crying a lot, like he is re- living the situation all over again  4.) Wife also want to discuss about FMLA?   I have advice wife, Dennis Vasquez, to take pt into UC today to have his groin area looked at and treated. Wife voiced understanding and will take pt.   Please advice on the issues.

## 2022-01-08 ENCOUNTER — Other Ambulatory Visit: Payer: Self-pay

## 2022-01-08 ENCOUNTER — Encounter: Payer: Self-pay | Admitting: Family Medicine

## 2022-01-08 DIAGNOSIS — R413 Other amnesia: Secondary | ICD-10-CM

## 2022-01-08 MED ORDER — DONEPEZIL HCL 5 MG PO TABS: 5.0000 mg | ORAL_TABLET | Freq: Every day | ORAL | 3 refills | Status: DC

## 2022-01-08 NOTE — Telephone Encounter (Signed)
Pt has appt on 01/11/22

## 2022-01-08 NOTE — Telephone Encounter (Signed)
Spoke with pt's wife, told her Rx has been sent to pharmacy. Pt's wife is aware.   Also, wife stated, she took pt to [facility] clinic yesterday to have the pt's R-groin area looked at. Pt was treated with antibiotics.  Wife wants to bring pt in for you to evaluate further, pt at on Monday,01/11/22 at 2:30pm

## 2022-01-10 ENCOUNTER — Encounter: Payer: Self-pay | Admitting: Family Medicine

## 2022-01-11 ENCOUNTER — Ambulatory Visit: Payer: 59 | Admitting: Family Medicine

## 2022-01-11 VITALS — BP 160/80 | HR 94 | Temp 97.7°F | Ht 75.0 in | Wt 310.0 lb

## 2022-01-11 DIAGNOSIS — L0292 Furuncle, unspecified: Secondary | ICD-10-CM | POA: Diagnosis not present

## 2022-01-11 DIAGNOSIS — I10 Essential (primary) hypertension: Secondary | ICD-10-CM

## 2022-01-11 DIAGNOSIS — L304 Erythema intertrigo: Secondary | ICD-10-CM | POA: Diagnosis not present

## 2022-01-11 MED ORDER — SULFAMETHOXAZOLE-TRIMETHOPRIM 800-160 MG PO TABS: 1.0000 | ORAL_TABLET | Freq: Two times a day (BID) | ORAL | 0 refills | Status: DC

## 2022-01-11 MED ORDER — NEBIVOLOL HCL 5 MG PO TABS: 5.0000 mg | ORAL_TABLET | Freq: Every day | ORAL | 3 refills | Status: DC
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
## 2022-01-13 ENCOUNTER — Other Ambulatory Visit: Payer: Self-pay | Admitting: Family Medicine

## 2022-01-14 ENCOUNTER — Other Ambulatory Visit: Payer: Self-pay | Admitting: Family Medicine

## 2022-01-14 NOTE — Telephone Encounter (Signed)
Requested medications are due for refill today.  yes  Requested medications are on the active medications list.  yes  Last refill. Zolpidem 12/17/2021 #30 0 refills,  Diazepam 11/17/2021 #60 0 refills  Future visit scheduled.   no  Notes to clinic.  Refills are not delegated    Requested Prescriptions  Pending Prescriptions Disp Refills   zolpidem (AMBIEN CR) 12.5 MG CR tablet [Pharmacy Med Name: ZOLPIDEM TARTRATE ER 12.5 MG TAB] 30 tablet     Sig: TAKE 1 TABLET BY MOUTH EVERY NIGHT AT BEDTIME     Not Delegated - Psychiatry:  Anxiolytics/Hypnotics Failed - 01/14/2022 12:22 PM      Failed - This refill cannot be delegated      Failed - Urine Drug Screen completed in last 360 days      Passed - Valid encounter within last 6 months    Recent Outpatient Visits           1 month ago Controlled type 2 diabetes mellitus with chronic kidney disease, unspecified CKD stage, unspecified whether long term insulin use ([place])   [place] Pickard, Cammie Mcgee, MD   1 year ago General medical exam   [place] Susy Frizzle, MD   2 years ago General medical exam   [place] Susy Frizzle, MD   3 years ago Shortness of breath   [place], Cammie Mcgee, MD   3 years ago Shortness of breath   [place] Pickard, Cammie Mcgee, MD               diazepam (VALIUM) 5 MG tablet [Pharmacy Med Name: DIAZEPAM 5 MG TAB] 60 tablet     Sig: TAKE 1 TABLET BY MOUTH EVERY 6 HOURS AS NEEDED FOR ANXIETY. DO NOT TAKE WITH AMBIEN     Not Delegated - Psychiatry: Anxiolytics/Hypnotics 2 Failed - 01/14/2022 12:22 PM      Failed - This refill cannot be delegated      Failed - Urine Drug Screen completed in last 360 days      Passed - Patient is not pregnant      Passed - Valid encounter within last 6 months    Recent Outpatient Visits           1 month ago Controlled type 2 diabetes mellitus with chronic  kidney disease, unspecified CKD stage, unspecified whether long term insulin use ([place])   [place] Pickard, Cammie Mcgee, MD   1 year ago General medical exam   [place] Susy Frizzle, MD   2 years ago General medical exam   [place], Warren T, MD   3 years ago Shortness of breath   [place], Cammie Mcgee, MD   3 years ago Shortness of breath   [place] Pickard, Cammie Mcgee, MD

## 2022-01-18 ENCOUNTER — Encounter: Payer: Self-pay | Admitting: Family Medicine

## 2022-01-19 ENCOUNTER — Other Ambulatory Visit: Payer: Self-pay | Admitting: Family Medicine

## 2022-01-31 ENCOUNTER — Encounter: Payer: Self-pay | Admitting: Family Medicine

## 2022-02-02 ENCOUNTER — Other Ambulatory Visit: Payer: Self-pay | Admitting: Family Medicine

## 2022-02-02 MED ORDER — MUPIROCIN CALCIUM 2 % EX CREA: 1.0000 | TOPICAL_CREAM | Freq: Two times a day (BID) | CUTANEOUS | 0 refills | Status: DC

## 2022-02-17 ENCOUNTER — Other Ambulatory Visit: Payer: Self-pay | Admitting: Family Medicine

## 2022-02-17 NOTE — Telephone Encounter (Signed)
Requested medication (s) are due for refill today: yes  Requested medication (s) are on the active medication list: yes  Last refill:  01/13/22 #30 0 refills  Future visit scheduled: no  Notes to clinic:  last seen 2 months ago . Do you want to continue refills?     Requested Prescriptions  Pending Prescriptions Disp Refills   amLODipine-benazepril (LOTREL) 10-40 MG capsule [Pharmacy Med Name: AMLODIPINE-BENAZEPRIL 10-40 MG CAP] 30 capsule 0    Sig: TAKE 1 CAPSULE BY MOUTH ONCE DAILY WILL NEED OFFICE VISIT FOR FURTHER REFILLS     Cardiovascular: CCB + ACEI Combos Failed - 02/17/2022  9:19 AM      Failed - Last BP in normal range    BP Readings from Last 1 Encounters:  01/11/22 (!) 160/80         Passed - Cr in normal range and within 180 days    Creatinine  Date Value Ref Range Status  12/25/2015 1.4 (H) 0.7 - 1.3 mg/dL Final   Creat  Date Value Ref Range Status  12/01/2021 1.25 0.70 - 1.28 mg/dL Final   Creatinine,U  Date Value Ref Range Status  03/17/2009 155.8 mg/dL Final         Passed - K in normal range and within 180 days    Potassium  Date Value Ref Range Status  12/01/2021 4.5 3.5 - 5.3 mmol/L Final  12/25/2015 3.8 3.5 - 5.1 mEq/L Final         Passed - Na in normal range and within 180 days    Sodium  Date Value Ref Range Status  12/01/2021 139 135 - 146 mmol/L Final  12/25/2015 141 136 - 145 mEq/L Final         Passed - eGFR is 30 or above and within 180 days    GFR, Est African American  Date Value Ref Range Status  10/20/2020 55 (L) > OR = 60 mL/min/1.41m2 Final   GFR, Est Non African American  Date Value Ref Range Status  10/20/2020 47 (L) > OR = 60 mL/min/1.28m2 Final   GFR  Date Value Ref Range Status  11/05/2011 61.90 >60.00 mL/min Final   eGFR  Date Value Ref Range Status  12/01/2021 61 > OR = 60 mL/min/1.21m2 Final    Comment:    The eGFR is based on the CKD-EPI 2021 equation. To calculate  the new eGFR from a previous Creatinine  or Cystatin C result, go to https://www.kidney.org/professionals/ kdoqi/gfr%5Fcalculator          Passed - Patient is not pregnant      Passed - Valid encounter within last 6 months    Recent Outpatient Visits           2 months ago Controlled type 2 diabetes mellitus with chronic kidney disease, unspecified CKD stage, unspecified whether long term insulin use ([place])   [place] Pickard, Cammie Mcgee, MD   1 year ago General medical exam   [place] Susy Frizzle, MD   2 years ago General medical exam   [place] Susy Frizzle, MD   3 years ago Shortness of breath   [place], Cammie Mcgee, MD   4 years ago Shortness of breath   [place] Pickard, Cammie Mcgee, MD

## 2022-03-02 ENCOUNTER — Telehealth: Payer: Self-pay | Admitting: Family Medicine

## 2022-03-02 NOTE — Telephone Encounter (Signed)
Received voicemail message from patient's spouse [name] to request a copy of the [facility] paperwork completed recently. She also stated she was told it was sent to him via MyChart but can't find it.   Please advise at [phone].

## 2022-03-03 NOTE — Telephone Encounter (Signed)
03/02/22 Called and spoke with pt's wife regarding her FMLA paper. Per pt, stated that she already have a copy thru her employer so she don't need a copy, quoted per wife "I'm good, I don't need copy. "  However, wife did address some of pt's concerns while talking w/her. Advice wife to send and email to Dr. Dennard Schaumann along with pictures for him to evaluate and advice.   Wife voiced understanding. Nothing further.

## 2022-03-04 ENCOUNTER — Encounter: Payer: Self-pay | Admitting: Family Medicine

## 2022-03-09 ENCOUNTER — Ambulatory Visit: Payer: 59 | Admitting: Family Medicine

## 2022-03-11 ENCOUNTER — Encounter: Payer: Self-pay | Admitting: Family Medicine

## 2022-03-12 ENCOUNTER — Ambulatory Visit: Payer: 59 | Admitting: Family Medicine

## 2022-03-12 VITALS — BP 138/82 | HR 68 | Ht 75.0 in | Wt 307.4 lb

## 2022-03-12 DIAGNOSIS — M7989 Other specified soft tissue disorders: Secondary | ICD-10-CM
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
## 2022-03-15 ENCOUNTER — Encounter: Payer: Self-pay | Admitting: Family Medicine

## 2022-03-16 ENCOUNTER — Ambulatory Visit
Admission: RE | Admit: 2022-03-16 | Discharge: 2022-03-16 | Disposition: A | Discharge status: Home / Self Care | Payer: 59 | Source: Ambulatory Visit | Attending: Family Medicine | Admitting: Family Medicine

## 2022-03-16 DIAGNOSIS — M7989 Other specified soft tissue disorders: Secondary | ICD-10-CM

## 2022-03-25 ENCOUNTER — Ambulatory Visit: Payer: 59 | Admitting: Family Medicine

## 2022-03-29 ENCOUNTER — Other Ambulatory Visit: Payer: Self-pay | Admitting: Family Medicine

## 2022-03-30 ENCOUNTER — Other Ambulatory Visit: Payer: Self-pay

## 2022-03-30 DIAGNOSIS — E1169 Type 2 diabetes mellitus with other specified complication: Secondary | ICD-10-CM

## 2022-03-30 MED ORDER — PRAVASTATIN SODIUM 40 MG PO TABS: ORAL_TABLET | ORAL | 3 refills | Status: DC

## 2022-04-01 ENCOUNTER — Ambulatory Visit: Payer: 59 | Admitting: Family Medicine

## 2022-04-01 ENCOUNTER — Other Ambulatory Visit: Payer: Self-pay | Admitting: Family Medicine

## 2022-04-01 NOTE — Telephone Encounter (Signed)
Requested medication (s) are due for refill today: yes  Requested medication (s) are on the active medication list: yes  Last refill:  02/02/22  Future visit scheduled: yes  Notes to clinic:  Unable to refill per protocol, Medication not assigned to a protocol, review manually.     Requested Prescriptions  Pending Prescriptions Disp Refills   mupirocin cream (BACTROBAN) 2 % [Pharmacy Med Name: MUPIROCIN CALCIUM 2% TOP CREAM GM] 15 g 0    Sig: APPLY 1 APPLICATION TOPICALLY TO AFFECTED AREA(S) 2 TIMES DAILY     Off-Protocol Failed - 04/01/2022  3:56 PM      Failed - Medication not assigned to a protocol, review manually.      Passed - Valid encounter within last 12 months    Recent Outpatient Visits           4 months ago Controlled type 2 diabetes mellitus with chronic kidney disease, unspecified CKD stage, unspecified whether long term insulin use ([place])   [place] Pickard, Cammie Mcgee, MD   1 year ago General medical exam   [place] Susy Frizzle, MD   2 years ago General medical exam   [place] Susy Frizzle, MD   3 years ago Shortness of breath   [place], Cammie Mcgee, MD   4 years ago Shortness of breath   [place] Pickard, Cammie Mcgee, MD

## 2022-04-05 ENCOUNTER — Other Ambulatory Visit: Payer: Self-pay | Admitting: Family Medicine

## 2022-04-14 ENCOUNTER — Other Ambulatory Visit: Payer: Self-pay | Admitting: Family Medicine

## 2022-04-14 NOTE — Telephone Encounter (Signed)
Requested Prescriptions  Pending Prescriptions Disp Refills  . furosemide (LASIX) 40 MG tablet [Pharmacy Med Name: FUROSEMIDE 40 MG TAB] 180 tablet 0    Sig: TAKE 1 TABLET BY MOUTH TWICE A DAY     Cardiovascular:  Diuretics - Loop Failed - 04/14/2022  9:17 AM      Failed - Mg Level in normal range and within 180 days    No results found for: "MG"       Passed - K in normal range and within 180 days    Potassium  Date Value Ref Range Status  12/01/2021 4.5 3.5 - 5.3 mmol/L Final  12/25/2015 3.8 3.5 - 5.1 mEq/L Final         Passed - Ca in normal range and within 180 days    Calcium  Date Value Ref Range Status  12/01/2021 9.7 8.6 - 10.3 mg/dL Final  12/25/2015 9.0 8.4 - 10.4 mg/dL Final         Passed - Na in normal range and within 180 days    Sodium  Date Value Ref Range Status  12/01/2021 139 135 - 146 mmol/L Final  12/25/2015 141 136 - 145 mEq/L Final         Passed - Cr in normal range and within 180 days    Creatinine  Date Value Ref Range Status  12/25/2015 1.4 (H) 0.7 - 1.3 mg/dL Final   Creat  Date Value Ref Range Status  12/01/2021 1.25 0.70 - 1.28 mg/dL Final   Creatinine,U  Date Value Ref Range Status  03/17/2009 155.8 mg/dL Final         Passed - Cl in normal range and within 180 days    Chloride  Date Value Ref Range Status  12/01/2021 100 98 - 110 mmol/L Final  12/25/2015 105 98 - 109 mEq/L Final         Passed - Last BP in normal range    BP Readings from Last 1 Encounters:  03/12/22 138/82         Passed - Valid encounter within last 6 months    Recent Outpatient Visits          4 months ago Controlled type 2 diabetes mellitus with chronic kidney disease, unspecified CKD stage, unspecified whether long term insulin use ([place])   [place] Pickard, Cammie Mcgee, MD   1 year ago General medical exam   [place] Susy Frizzle, MD   2 years ago General medical exam   [place] Susy Frizzle, MD   3 years ago Shortness of breath   [place], Warren T, MD   4 years ago Shortness of breath   [place] Pickard, Cammie Mcgee, MD

## 2022-05-04 ENCOUNTER — Other Ambulatory Visit: Payer: Self-pay | Admitting: Family Medicine

## 2022-05-10 ENCOUNTER — Other Ambulatory Visit: Payer: Self-pay

## 2022-05-10 ENCOUNTER — Other Ambulatory Visit: Payer: Self-pay | Admitting: Family Medicine

## 2022-05-10 DIAGNOSIS — E1122 Type 2 diabetes mellitus with diabetic chronic kidney disease: Secondary | ICD-10-CM

## 2022-05-10 MED ORDER — ONETOUCH ULTRA VI STRP: 1.0000 | ORAL_STRIP | Freq: Every day | 3 refills | Status: DC

## 2022-05-10 MED ORDER — ONETOUCH ULTRA VI STRP: 1.0000 | ORAL_STRIP | 3 refills | Status: DC | PRN

## 2022-05-11 ENCOUNTER — Other Ambulatory Visit: Payer: Self-pay | Admitting: Family Medicine

## 2022-05-24 ENCOUNTER — Other Ambulatory Visit: Payer: Self-pay | Admitting: Family Medicine

## 2022-05-25 ENCOUNTER — Other Ambulatory Visit: Payer: Self-pay

## 2022-05-25 ENCOUNTER — Other Ambulatory Visit: Payer: Self-pay | Admitting: Family Medicine

## 2022-05-25 DIAGNOSIS — I10 Essential (primary) hypertension: Secondary | ICD-10-CM

## 2022-05-25 DIAGNOSIS — J439 Emphysema, unspecified: Secondary | ICD-10-CM

## 2022-05-25 DIAGNOSIS — J9611 Chronic respiratory failure with hypoxia: Secondary | ICD-10-CM

## 2022-05-25 MED ORDER — AMLODIPINE BESY-BENAZEPRIL HCL 10-40 MG PO CAPS: ORAL_CAPSULE | ORAL | 0 refills | Status: DC

## 2022-05-25 MED ORDER — ANORO ELLIPTA 62.5-25 MCG/ACT IN AEPB: 1.0000 | INHALATION_SPRAY | Freq: Every day | RESPIRATORY_TRACT | 5 refills | Status: DC

## 2022-05-25 NOTE — Telephone Encounter (Signed)
Requested medication (s) are due for refill today: routing for review.  Requested medication (s) are on the active medication list: yes  Last refill:  09/22/21  Future visit scheduled: no  Notes to clinic:  To pharmacy: PT IS ON TIME FOR REQUESTING ANORO, HE IS ACTUALLY OVER DUE FOR A REFILL, routing for review.     Requested Prescriptions  Pending Prescriptions Disp Refills   ANORO ELLIPTA 62.5-25 MCG/ACT AEPB [Pharmacy Med Name: Celedonio Miyamoto 62.5-25 MCG/ACT INH A] 55 each 5    Sig: INHALE 1 PUFF INTO THE LUNGS DAILY     Pulmonology:  Combination Products Passed - 05/24/2022  5:20 PM      Passed - Valid encounter within last 12 months    Recent Outpatient Visits           5 months ago Controlled type 2 diabetes mellitus with chronic kidney disease, unspecified CKD stage, unspecified whether long term insulin use ([place])   [place] Pickard, Cammie Mcgee, MD   1 year ago General medical exam   [place] Susy Frizzle, MD   2 years ago General medical exam   [place] Susy Frizzle, MD   4 years ago Shortness of breath   [place] Pickard, Cammie Mcgee, MD   4 years ago Shortness of breath   [place] Pickard, Cammie Mcgee, MD

## 2022-05-25 NOTE — Telephone Encounter (Signed)
Unable to refill per protocol, Rx request was refused, will refuse duplicate request.  Requested Prescriptions  Pending Prescriptions Disp Refills   ANORO ELLIPTA 62.5-25 MCG/ACT AEPB [Pharmacy Med Name: Celedonio Miyamoto 62.5-25 MCG/ACT INH A] 60 each 5    Sig: INHALE 1 PUFF INTO THE LUNGS DAILY     Pulmonology:  Combination Products Passed - 05/25/2022  3:09 PM      Passed - Valid encounter within last 12 months    Recent Outpatient Visits           5 months ago Controlled type 2 diabetes mellitus with chronic kidney disease, unspecified CKD stage, unspecified whether long term insulin use ([place])   [place] Pickard, Cammie Mcgee, MD   1 year ago General medical exam   [place] Susy Frizzle, MD   2 years ago General medical exam   [place] Susy Frizzle, MD   4 years ago Shortness of breath   [place] Pickard, Cammie Mcgee, MD   4 years ago Shortness of breath   [place] Pickard, Cammie Mcgee, MD

## 2022-06-08 ENCOUNTER — Telehealth: Payer: Self-pay | Admitting: Family Medicine

## 2022-06-08 NOTE — Telephone Encounter (Signed)
Left message for patient to call back and schedule Medicare Annual Wellness Visit (AWV) in office.   If not able to come in office, please offer to do virtually or by telephone.   Last AWV: 10/20/2020   Please schedule at anytime with [facility]  If any questions, please contact me at [phone].  Thank you ,  Colletta [place]

## 2022-06-10 ENCOUNTER — Other Ambulatory Visit: Payer: Self-pay | Admitting: Family Medicine

## 2022-06-10 DIAGNOSIS — I10 Essential (primary) hypertension: Secondary | ICD-10-CM

## 2022-06-10 NOTE — Telephone Encounter (Signed)
Last OV 03/12/22. Future visit in 3 weeks. Requested Prescriptions  Pending Prescriptions Disp Refills   cloNIDine (CATAPRES) 0.3 MG tablet [Pharmacy Med Name: CLONIDINE HCL 0.3 MG TAB] 270 tablet 1    Sig: TAKE 1 TABLET BY MOUTH 3 TIMES DAILY WILL NEED OFFICE VISIT FOR FURTHER REFILLS     Cardiovascular:  Alpha-2 Agonists Failed - 06/10/2022  3:16 PM      Failed - Valid encounter within last 6 months    Recent Outpatient Visits           6 months ago Controlled type 2 diabetes mellitus with chronic kidney disease, unspecified CKD stage, unspecified whether long term insulin use ([place])   [place] Pickard, Cammie Mcgee, MD   1 year ago General medical exam   [place] Susy Frizzle, MD   2 years ago General medical exam   [place] Susy Frizzle, MD   4 years ago Shortness of breath   [place] Dennard Schaumann, Cammie Mcgee, MD   4 years ago Shortness of breath   [place], Cammie Mcgee, MD       Future Appointments             In 3 weeks Dennard Schaumann, Cammie Mcgee, MD [place]            Passed - Last BP in normal range    BP Readings from Last 1 Encounters:  03/12/22 138/82         Passed - Last Heart Rate in normal range    Pulse Readings from Last 1 Encounters:  03/12/22 68

## 2022-06-14 ENCOUNTER — Other Ambulatory Visit: Payer: Self-pay | Admitting: Family Medicine

## 2022-06-22 ENCOUNTER — Other Ambulatory Visit: Payer: Self-pay | Admitting: Family Medicine

## 2022-06-22 DIAGNOSIS — I10 Essential (primary) hypertension: Secondary | ICD-10-CM

## 2022-07-01 ENCOUNTER — Telehealth: Payer: Self-pay | Admitting: Family Medicine

## 2022-07-01 NOTE — Telephone Encounter (Signed)
Patient's spouse called to cancel appointment scheduled for tomorrow; patient has severe diarrhea. He's already had his third episode this morning.   Requesting call back with best advice for managing sx.  Please advise at [phone].

## 2022-07-02 ENCOUNTER — Encounter: Payer: 59 | Admitting: Family Medicine

## 2022-07-09 ENCOUNTER — Encounter: Payer: Self-pay | Admitting: Family Medicine

## 2022-07-09 ENCOUNTER — Ambulatory Visit: Payer: 59 | Admitting: Family Medicine

## 2022-07-09 VITALS — BP 132/72 | HR 62 | Ht 75.0 in | Wt 303.4 lb

## 2022-07-09 DIAGNOSIS — E11649 Type 2 diabetes mellitus with hypoglycemia without coma: Secondary | ICD-10-CM

## 2022-07-09 DIAGNOSIS — R1904 Left lower quadrant abdominal swelling, mass and lump: Secondary | ICD-10-CM | POA: Diagnosis not present
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
## 2022-07-10 LAB — COMPLETE METABOLIC PANEL WITH GFR
AG Ratio: 1.9 (calc) (ref 1.0–2.5)
ALT: 17 U/L (ref 9–46)
AST: 15 U/L (ref 10–35)
Albumin: 4.5 g/dL (ref 3.6–5.1)
Alkaline phosphatase (APISO): 63 U/L (ref 35–144)
BUN: 16 mg/dL (ref 7–25)
CO2: 26 mmol/L (ref 20–32)
Calcium: 9.8 mg/dL (ref 8.6–10.3)
Chloride: 101 mmol/L (ref 98–110)
Creat: 1.24 mg/dL (ref 0.70–1.28)
Globulin: 2.4 g/dL (calc) (ref 1.9–3.7)
Glucose, Bld: 175 mg/dL — ABNORMAL HIGH (ref 65–99)
Potassium: 4.2 mmol/L (ref 3.5–5.3)
Sodium: 140 mmol/L (ref 135–146)
Total Bilirubin: 0.6 mg/dL (ref 0.2–1.2)
Total Protein: 6.9 g/dL (ref 6.1–8.1)
eGFR: 61 mL/min/{1.73_m2} (ref >=60)

## 2022-07-10 LAB — CBC WITH DIFFERENTIAL/PLATELET
Absolute Monocytes: 727 cells/uL (ref 200–950)
Basophils Absolute: 30 cells/uL (ref 0–200)
Basophils Relative: 0.3 %
Eosinophils Absolute: 71 cells/uL (ref 15–500)
Eosinophils Relative: 0.7 %
HCT: 51 % — ABNORMAL HIGH (ref 38.5–50.0)
Hemoglobin: 17.1 g/dL (ref 13.2–17.1)
Lymphs Abs: 1576 cells/uL (ref 850–3900)
MCH: 30.4 pg (ref 27.0–33.0)
MCHC: 33.5 g/dL (ref 32.0–36.0)
MCV: 90.7 fL (ref 80.0–100.0)
MPV: 9.4 fL (ref 7.5–12.5)
Monocytes Relative: 7.2 %
Neutro Abs: 7696 cells/uL (ref 1500–7800)
Neutrophils Relative %: 76.2 %
Platelets: 284 10*3/uL (ref 140–400)
RBC: 5.62 10*6/uL (ref 4.20–5.80)
RDW: 12.7 % (ref 11.0–15.0)
Total Lymphocyte: 15.6 %
WBC: 10.1 10*3/uL (ref 3.8–10.8)

## 2022-07-10 LAB — HEMOGLOBIN A1C
Hgb A1c MFr Bld: 8.2 % of total Hgb — ABNORMAL HIGH (ref <5.7)
Mean Plasma Glucose: 189 mg/dL
eAG (mmol/L): 10.4 mmol/L

## 2022-07-15 ENCOUNTER — Other Ambulatory Visit: Payer: Self-pay

## 2022-07-15 DIAGNOSIS — E11649 Type 2 diabetes mellitus with hypoglycemia without coma: Secondary | ICD-10-CM

## 2022-07-15 MED ORDER — TRULICITY 0.75 MG/0.5ML ~~LOC~~ SOAJ: 0.7500 mg | SUBCUTANEOUS | 1 refills | Status: DC

## 2022-07-15 MED ORDER — SEMAGLUTIDE(0.25 OR 0.5MG/DOS) 2 MG/3ML ~~LOC~~ SOPN: 0.5000 mg | PEN_INJECTOR | Freq: Every day | SUBCUTANEOUS | 1 refills | Status: DC

## 2022-07-19 ENCOUNTER — Other Ambulatory Visit: Payer: Self-pay | Admitting: Family Medicine

## 2022-07-19 NOTE — Telephone Encounter (Signed)
Requested medications are due for refill today.  unsure  Requested medications are on the active medications list.  yes  Last refill. 05/11/2022  Future visit scheduled.   no  Notes to clinic.  Refills not delegated.    Requested Prescriptions  Pending Prescriptions Disp Refills   zolpidem (AMBIEN CR) 12.5 MG CR tablet [Pharmacy Med Name: ZOLPIDEM TARTRATE ER 12.5 MG TAB] 30 tablet     Sig: TAKE 1 TABLET BY MOUTH EVERY NIGHT AT BEDTIME     Not Delegated - Psychiatry:  Anxiolytics/Hypnotics Failed - 07/19/2022 10:53 AM      Failed - This refill cannot be delegated      Failed - Urine Drug Screen completed in last 360 days      Failed - Valid encounter within last 6 months    Recent Outpatient Visits           7 months ago Controlled type 2 diabetes mellitus with chronic kidney disease, unspecified CKD stage, unspecified whether long term insulin use ([place])   [place] Pickard, Cammie Mcgee, MD   1 year ago General medical exam   [place] Susy Frizzle, MD   2 years ago General medical exam   [place] Susy Frizzle, MD   4 years ago Shortness of breath   [place] Dennard Schaumann, Cammie Mcgee, MD   4 years ago Shortness of breath   [place] Pickard, Cammie Mcgee, MD               diazepam (VALIUM) 5 MG tablet [Pharmacy Med Name: DIAZEPAM 5 MG TAB] 60 tablet     Sig: TAKE 1 TABLET BY MOUTH EVERY 6 HOURS AS NEEDED FOR ANXIETY. DO NOT TAKE WITH AMBIEN     Not Delegated - Psychiatry: Anxiolytics/Hypnotics 2 Failed - 07/19/2022 10:53 AM      Failed - This refill cannot be delegated      Failed - Urine Drug Screen completed in last 360 days      Failed - Valid encounter within last 6 months    Recent Outpatient Visits           7 months ago Controlled type 2 diabetes mellitus with chronic kidney disease, unspecified CKD stage, unspecified whether long term insulin use ([place])   [place] Pickard, Cammie Mcgee, MD   1 year ago General medical exam   [place] Susy Frizzle, MD   2 years ago General medical exam   [place] Susy Frizzle, MD   4 years ago Shortness of breath   [place] Pickard, Cammie Mcgee, MD   4 years ago Shortness of breath   [place], Cammie Mcgee, MD              Passed - Patient is not pregnant

## 2022-07-22 ENCOUNTER — Telehealth: Payer: Self-pay

## 2022-07-22 ENCOUNTER — Other Ambulatory Visit: Payer: Self-pay

## 2022-07-22 ENCOUNTER — Other Ambulatory Visit: Payer: Self-pay | Admitting: Family Medicine

## 2022-07-22 DIAGNOSIS — I10 Essential (primary) hypertension: Secondary | ICD-10-CM

## 2022-07-22 MED ORDER — AMLODIPINE BESY-BENAZEPRIL HCL 10-40 MG PO CAPS: 1.0000 | ORAL_CAPSULE | Freq: Every day | ORAL | 3 refills | Status: DC

## 2022-07-22 NOTE — Telephone Encounter (Signed)
Pt's wife called in asking if pt could get a refill of a 90 day supply instead of the 30 day supply of this med amLODipine-benazepril (LOTREL) 10-40 MG capsule. Please advise.  Cb#: [phone]

## 2022-07-26 ENCOUNTER — Telehealth: Payer: Self-pay | Admitting: Family Medicine

## 2022-07-26 NOTE — Telephone Encounter (Signed)
Left message for patient to call back and schedule Medicare Annual Wellness Visit (AWV) in office.   If not able to come in office, please offer to do virtually or by telephone.   Last AWV: 10/20/2020   Please schedule at any time with [facility]-Nurse Health Advisor.  30 minute appointment  Any questions, please contact me at [phone]   Thank you,   [facility]  Ambulatory Clinical Support for [place] [facility]. [facility] Are. [facility] ??[phone] or ??[phone]

## 2022-08-09 ENCOUNTER — Inpatient Hospital Stay: Admission: RE | Admit: 2022-08-09 | Payer: 59 | Source: Ambulatory Visit

## 2022-08-12 ENCOUNTER — Other Ambulatory Visit: Payer: Self-pay | Admitting: Family Medicine

## 2022-08-12 DIAGNOSIS — K219 Gastro-esophageal reflux disease without esophagitis: Secondary | ICD-10-CM

## 2022-08-13 NOTE — Telephone Encounter (Signed)
Requested Prescriptions  Pending Prescriptions Disp Refills   omeprazole (PRILOSEC) 20 MG capsule [Pharmacy Med Name: OMEPRAZOLE DR 20 MG CAP] 90 capsule 1    Sig: TAKE 1 CAPSULE BY MOUTH ONCE DAILY     Gastroenterology: Proton Pump Inhibitors Passed - 08/12/2022  5:26 PM      Passed - Valid encounter within last 12 months    Recent Outpatient Visits           8 months ago Controlled type 2 diabetes mellitus with chronic kidney disease, unspecified CKD stage, unspecified whether long term insulin use ([place])   [place] Pickard, Cammie Mcgee, MD   1 year ago General medical exam   [place] Susy Frizzle, MD   2 years ago General medical exam   [place] Susy Frizzle, MD   4 years ago Shortness of breath   [place] Pickard, Cammie Mcgee, MD   4 years ago Shortness of breath   [place] Pickard, Cammie Mcgee, MD

## 2022-09-01 ENCOUNTER — Other Ambulatory Visit: Payer: 59

## 2022-09-02 ENCOUNTER — Telehealth: Payer: Self-pay | Admitting: Family Medicine

## 2022-09-02 NOTE — Telephone Encounter (Signed)
Called patient to schedule Medicare Annual Wellness Visit (AWV). Left message for patient to call back and schedule Medicare Annual Wellness Visit (AWV).  Last date of AWV: 10/20/2020   Please schedule an appointment at any time with either Dennis Vasquez or [place], [facility]. .  If any questions, please contact me at [phone].  Thank you,  Colletta [place],  [place] Program Direct Dial ??[phone]

## 2022-09-09 ENCOUNTER — Other Ambulatory Visit: Payer: Self-pay | Admitting: Family Medicine

## 2022-09-09 DIAGNOSIS — I10 Essential (primary) hypertension: Secondary | ICD-10-CM

## 2022-09-13 ENCOUNTER — Other Ambulatory Visit: Payer: Self-pay | Admitting: Family Medicine

## 2022-09-14 ENCOUNTER — Other Ambulatory Visit: Payer: Self-pay | Admitting: Family Medicine

## 2022-09-24 ENCOUNTER — Encounter: Payer: Self-pay | Admitting: Family Medicine

## 2022-09-27 ENCOUNTER — Other Ambulatory Visit: Payer: 59

## 2022-10-01 ENCOUNTER — Other Ambulatory Visit: Payer: Self-pay | Admitting: Family Medicine

## 2022-10-01 NOTE — Telephone Encounter (Signed)
Requested Prescriptions  Pending Prescriptions Disp Refills   nebivolol (BYSTOLIC) 5 MG tablet [Pharmacy Med Name: NEBIVOLOL HYDROCHLORIDE 5MG  TABLET] 90 tablet 0    Sig: TAKE ONE TABLET BY MOUTH ONCE A DAY     Cardiovascular: Beta Blockers 3 Failed - 10/01/2022  2:40 PM      Failed - Valid encounter within last 6 months    Recent Outpatient Visits           10 months ago Controlled type 2 diabetes mellitus with chronic kidney disease, unspecified CKD stage, unspecified whether long term insulin use ([place])   [place] Pickard, Cammie Mcgee, MD   1 year ago General medical exam   [place] Susy Frizzle, MD   3 years ago General medical exam   [place] Susy Frizzle, MD   4 years ago Shortness of breath   [place] Dennard Schaumann, Cammie Mcgee, MD   4 years ago Shortness of breath   [place] Pickard, Cammie Mcgee, MD              Passed - Cr in normal range and within 360 days    Creatinine  Date Value Ref Range Status  12/25/2015 1.4 (H) 0.7 - 1.3 mg/dL Final   Creat  Date Value Ref Range Status  07/09/2022 1.24 0.70 - 1.28 mg/dL Final   Creatinine,U  Date Value Ref Range Status  03/17/2009 155.8 mg/dL Final         Passed - AST in normal range and within 360 days    AST  Date Value Ref Range Status  07/09/2022 15 10 - 35 U/L Final  12/25/2015 22 5 - 34 U/L Final         Passed - ALT in normal range and within 360 days    ALT  Date Value Ref Range Status  07/09/2022 17 9 - 46 U/L Final  12/25/2015 29 0 - 55 U/L Final         Passed - Last BP in normal range    BP Readings from Last 1 Encounters:  07/09/22 132/72         Passed - Last Heart Rate in normal range    Pulse Readings from Last 1 Encounters:  07/09/22 62

## 2022-10-05 ENCOUNTER — Other Ambulatory Visit: Payer: Self-pay | Admitting: Family Medicine

## 2022-10-08 ENCOUNTER — Telehealth: Payer: Self-pay | Admitting: Family Medicine

## 2022-10-08 NOTE — Telephone Encounter (Signed)
Called patient to schedule Medicare Annual Wellness Visit (AWV). Left message for patient to call back and schedule Medicare Annual Wellness Visit (AWV).  Last date of AWV: 10/16/16/2022   Please schedule an appointment at any time with Toni Amend, [facility] .  If any questions, please contact me at [phone].  Thank you,  Judeth Cornfield,  AMB Clinical Support [facility] AWV Program Direct Dial ??[phone]

## 2022-10-29 ENCOUNTER — Other Ambulatory Visit: Payer: Self-pay | Admitting: Family Medicine

## 2022-11-26 ENCOUNTER — Other Ambulatory Visit: Payer: Self-pay | Admitting: Family Medicine

## 2022-12-02 ENCOUNTER — Other Ambulatory Visit: Payer: Self-pay | Admitting: Family Medicine

## 2022-12-02 NOTE — Telephone Encounter (Signed)
Requested Prescriptions  Pending Prescriptions Disp Refills   potassium chloride SA (KLOR-CON M) 20 MEQ tablet [Pharmacy Med Name: POTASSIUM CHLORIDE ER ER TABLET ER] 180 tablet 0    Sig: TAKE ONE TABLET BY MOUTH TWICE A DAY     Endocrinology:  Minerals - Potassium Supplementation Failed - 12/02/2022  9:47 AM      Failed - Valid encounter within last 12 months    Recent Outpatient Visits           1 year ago Controlled type 2 diabetes mellitus with chronic kidney disease, unspecified CKD stage, unspecified whether long term insulin use (HCC)   [facility] Pickard, Priscille Heidelberg, MD   2 years ago General medical exam   [facility] Donita Brooks, MD   3 years ago General medical exam   [facility] Donita Brooks, MD   4 years ago Shortness of breath   [facility] Donita Brooks, MD   4 years ago Shortness of breath   [facility] Pickard, Priscille Heidelberg, MD              Passed - K in normal range and within 360 days    Potassium  Date Value Ref Range Status  07/09/2022 4.2 3.5 - 5.3 mmol/L Final  12/25/2015 3.8 3.5 - 5.1 mEq/L Final         Passed - Cr in normal range and within 360 days    Creatinine  Date Value Ref Range Status  12/25/2015 1.4 (H) 0.7 - 1.3 mg/dL Final   Creat  Date Value Ref Range Status  07/09/2022 1.24 0.70 - 1.28 mg/dL Final   Creatinine,U  Date Value Ref Range Status  03/17/2009 155.8 mg/dL Final

## 2022-12-21 ENCOUNTER — Other Ambulatory Visit: Payer: Self-pay | Admitting: Family Medicine

## 2022-12-21 NOTE — Telephone Encounter (Signed)
Requested medication (s) are due for refill today: yes  Requested medication (s) are on the active medication list: yes  Last refill:  09/16/22  Future visit scheduled: no  Notes to clinic:  med not delegated to NT to RF   Requested Prescriptions  Pending Prescriptions Disp Refills   diazepam (VALIUM) 5 MG tablet [Pharmacy Med Name: DIAZEPAM 5MG  TABLET] 60 tablet 1    Sig: TAKE ONE TABLET BY MOUTH EVERY SIX HOURS AS NEEDED FOR ANXIETY. DO NOT TAKE WITH AMBIEN     Not Delegated - Psychiatry: Anxiolytics/Hypnotics 2 Failed - 12/21/2022  8:03 AM      Failed - This refill cannot be delegated      Failed - Urine Drug Screen completed in last 360 days      Failed - Valid encounter within last 6 months    Recent Outpatient Visits           1 year ago Controlled type 2 diabetes mellitus with chronic kidney disease, unspecified CKD stage, unspecified whether long term insulin use (HCC)   [facility] Pickard, Priscille Heidelberg, MD   2 years ago General medical exam   [facility] Donita Brooks, MD   3 years ago General medical exam   [facility] Donita Brooks, MD   4 years ago Shortness of breath   [facility] Pickard, Priscille Heidelberg, MD   4 years ago Shortness of breath   [facility] Pickard, Priscille Heidelberg, MD              Passed - Patient is not pregnant

## 2023-01-03 ENCOUNTER — Other Ambulatory Visit: Payer: Self-pay | Admitting: Family Medicine

## 2023-01-05 ENCOUNTER — Other Ambulatory Visit: Payer: Self-pay | Admitting: Family Medicine

## 2023-01-12 ENCOUNTER — Other Ambulatory Visit: Payer: Self-pay | Admitting: Family Medicine

## 2023-01-12 DIAGNOSIS — K219 Gastro-esophageal reflux disease without esophagitis: Secondary | ICD-10-CM

## 2023-01-12 NOTE — Telephone Encounter (Signed)
Last OV 07/09/22 Requested Prescriptions  Pending Prescriptions Disp Refills   omeprazole (PRILOSEC) 20 MG capsule [Pharmacy Med Name: OMEPRAZOLE 20MG  CAPSULE DR] 90 capsule 0    Sig: TAKE ONE CAPSULE BY MOUTH ONCE DAILY     Gastroenterology: Proton Pump Inhibitors Failed - 01/12/2023  9:16 AM      Failed - Valid encounter within last 12 months    Recent Outpatient Visits           1 year ago Controlled type 2 diabetes mellitus with chronic kidney disease, unspecified CKD stage, unspecified whether long term insulin use (H[facility])    [facility]isc[facility], MD   2 years ago General medical exam   [facility] Donita Brooks, MD   3 years ago General medical exam   [facility] Donita Brooks, MD   4 years ago Shortness of breath   [facility] Pickard, Priscille Heidelberg, MD   4 years ago Shortness of breath   [facility] Pickard, Priscille Heidelberg, MD

## 2023-02-02 ENCOUNTER — Other Ambulatory Visit: Payer: Self-pay | Admitting: Family Medicine

## 2023-02-02 DIAGNOSIS — J9611 Chronic respiratory failure with hypoxia: Secondary | ICD-10-CM

## 2023-02-02 DIAGNOSIS — J439 Emphysema, unspecified: Secondary | ICD-10-CM

## 2023-02-03 ENCOUNTER — Other Ambulatory Visit: Payer: Self-pay | Admitting: Family Medicine

## 2023-02-03 DIAGNOSIS — J9611 Chronic respiratory failure with hypoxia: Secondary | ICD-10-CM

## 2023-02-03 DIAGNOSIS — J439 Emphysema, unspecified: Secondary | ICD-10-CM

## 2023-02-03 MED ORDER — ANORO ELLIPTA 62.5-25 MCG/ACT IN AEPB
1.0000 | INHALATION_SPRAY | Freq: Every day | RESPIRATORY_TRACT | 5 refills | Status: DC
Start: 2023-02-03 — End: 2023-12-29

## 2023-02-15 ENCOUNTER — Other Ambulatory Visit: Payer: Self-pay | Admitting: Family Medicine

## 2023-02-17 NOTE — Telephone Encounter (Signed)
Requested medications are due for refill today.  yes  Requested medications are on the active medications list.  yes  Last refill. Zolpidem 11/26/2022 #30 1 rf, diazepam 12/23/2022 #60 1 rf  Future visit scheduled.   no  Notes to clinic.  Refills are not delegated.    Requested Prescriptions  Pending Prescriptions Disp Refills   zolpidem (AMBIEN CR) 12.5 MG CR tablet [Pharmacy Med Name: ZOLPIDEM TARTRATE ER 12.5MG  TABLET ER] 30 tablet 2    Sig: TAKE 1 TABLET BY MOUTH EVERY NIGHT AT   BEDTIME     Not Delegated - Psychiatry:  Anxiolytics/Hypnotics Failed - 02/15/2023 12:51 PM      Failed - This refill cannot be delegated      Failed - Urine Drug Screen completed in last 360 days      Failed - Valid encounter within last 6 months    Recent Outpatient Visits           1 year ago Controlled type 2 diabetes mellitus with chronic kidney disease, unspecified CKD stage, unspecified whether long term insulin use (HCC)   [facility] Pickard, Priscille Heidelberg, MD   2 years ago General medical exam   [facility] Donita Brooks, MD   3 years ago General medical exam   [facility] Donita Brooks, MD   4 years ago Shortness of breath   [facility] Tanya Nones, Priscille Heidelberg, MD   5 years ago Shortness of breath   [facility] Pickard, Priscille Heidelberg, MD               diazepam (VALIUM) 5 MG tablet [Pharmacy Med Name: DIAZEPAM 5MG  TABLET] 60 tablet 2    Sig: TAKE ONE TABLET BY MOUTH EVERY SIX HOURS AS NEEDED FOR ANXIETY. DO NOT TAKE WITH AMBIEN     Not Delegated - Psychiatry: Anxiolytics/Hypnotics 2 Failed - 02/15/2023 12:51 PM      Failed - This refill cannot be delegated      Failed - Urine Drug Screen completed in last 360 days      Failed - Valid encounter within last 6 months    Recent Outpatient Visits           1 year ago Controlled type 2 diabetes mellitus with chronic kidney disease, unspecified CKD stage,  unspecified whether long term insulin use (HCC)   [facility] Pickard, Priscille Heidelberg, MD   2 years ago General medical exam   [facility] Donita Brooks, MD   3 years ago General medical exam   [facility] Donita Brooks, MD   4 years ago Shortness of breath   [facility] Pickard, Priscille Heidelberg, MD   5 years ago Shortness of breath   [facility] Pickard, Priscille Heidelberg, MD              Passed - Patient is not pregnant

## 2023-02-24 NOTE — Telephone Encounter (Signed)
Patient's wife [name] called to follow up on refills requested for  diazepam (VALIUM) 5 MG tablet   zolpidem (AMBIEN CR) 12.5 MG CR tablet [[id number]]   LOV:07/09/2022 [name] stated patient in a lot of pain and unable to sleep; also stated pharmacy hasn't received approval for refills.  Last doses taken 02/18/23  Pharmacy confirmed as:  [facility] - [place], [place] - [address], [place] [address] Phone: [phone]  Fax: [phone]    Please advise at [phone]

## 2023-02-28 ENCOUNTER — Other Ambulatory Visit: Payer: Self-pay | Admitting: Family Medicine

## 2023-02-28 MED ORDER — DIAZEPAM 5 MG PO TABS: ORAL_TABLET | ORAL | 1 refills | Status: DC

## 2023-02-28 MED ORDER — ZOLPIDEM TARTRATE ER 12.5 MG PO TBCR: 12.5000 mg | EXTENDED_RELEASE_TABLET | Freq: Every day | ORAL | 1 refills | Status: DC

## 2023-03-11 ENCOUNTER — Ambulatory Visit: Payer: 59 | Admitting: Family Medicine

## 2023-03-15 ENCOUNTER — Other Ambulatory Visit: Payer: Self-pay | Admitting: Family Medicine

## 2023-03-16 NOTE — Telephone Encounter (Signed)
Requested Prescriptions  Pending Prescriptions Disp Refills   potassium chloride SA (KLOR-CON M) 20 MEQ tablet [Pharmacy Med Name: POTASSIUM CHLORIDE ER ER TABLET ER] 180 tablet 0    Sig: TAKE ONE TABLET BY MOUTH TWICE A DAY     Endocrinology:  Minerals - Potassium Supplementation Failed - 03/15/2023 10:57 AM      Failed - Valid encounter within last 12 months    Recent Outpatient Visits           1 year ago Controlled type 2 diabetes mellitus with chronic kidney disease, unspecified CKD stage, unspecified whether long term insulin use (HCC)   [facility] Pickard, Priscille Heidelberg, MD   2 years ago General medical exam   [facility] Donita Brooks, MD   3 years ago General medical exam   [facility] Donita Brooks, MD   4 years ago Shortness of breath   [facility] Tanya Nones, Priscille Heidelberg, MD   5 years ago Shortness of breath   [facility] Pickard, Priscille Heidelberg, MD       Future Appointments             In 5 days Donita Brooks, MD [facility]            Passed - K in normal range and within 360 days    Potassium  Date Value Ref Range Status  07/09/2022 4.2 3.5 - 5.3 mmol/L Final  12/25/2015 3.8 3.5 - 5.1 mEq/L Final         Passed - Cr in normal range and within 360 days    Creatinine  Date Value Ref Range Status  12/25/2015 1.4 (H) 0.7 - 1.3 mg/dL Final   Creat  Date Value Ref Range Status  07/09/2022 1.24 0.70 - 1.28 mg/dL Final   Creatinine,U  Date Value Ref Range Status  03/17/2009 155.8 mg/dL Final          nebivolol (BYSTOLIC) 5 MG tablet [Pharmacy Med Name: NEBIVOLOL HYDROCHLORIDE 5MG  TABLET] 90 tablet 0    Sig: TAKE ONE TABLET BY MOUTH ONCE A DAY     Cardiovascular: Beta Blockers 3 Failed - 03/15/2023 10:57 AM      Failed - Valid encounter within last 6 months    Recent Outpatient Visits           1 year ago Controlled type 2  diabetes mellitus with chronic kidney disease, unspecified CKD stage, unspecified whether long term insulin use (HCC)   [facility] Pickard, Priscille Heidelberg, MD   2 years ago General medical exam   [facility] Donita Brooks, MD   3 years ago General medical exam   [facility] Donita Brooks, MD   4 years ago Shortness of breath   [facility] Tanya Nones, Priscille Heidelberg, MD   5 years ago Shortness of breath   [facility] Pickard, Priscille Heidelberg, MD       Future Appointments             In 5 days Pickard, Priscille Heidelberg, MD [facility]            Passed - Cr in normal range and within 360 days    Creatinine  Date Value Ref Range Status  12/25/2015 1.4 (H) 0.7 - 1.3 mg/dL Final   Creat  Date Value Ref Range Status  07/09/2022 1.24 0.70 - 1.28 mg/dL Final   Creatinine,U  Date Value Ref Range Status  03/17/2009 155.8 mg/dL Final         Passed - AST in normal range and within 360 days    AST  Date Value Ref Range Status  07/09/2022 15 10 - 35 U/L Final  12/25/2015 22 5 - 34 U/L Final         Passed - ALT in normal range and within 360 days    ALT  Date Value Ref Range Status  07/09/2022 17 9 - 46 U/L Final  12/25/2015 29 0 - 55 U/L Final         Passed - Last BP in normal range    BP Readings from Last 1 Encounters:  07/09/22 132/72         Passed - Last Heart Rate in normal range    Pulse Readings from Last 1 Encounters:  07/09/22 62

## 2023-03-21 ENCOUNTER — Ambulatory Visit: Payer: 59 | Admitting: Family Medicine

## 2023-03-24 ENCOUNTER — Other Ambulatory Visit: Payer: Self-pay | Admitting: Family Medicine

## 2023-03-24 DIAGNOSIS — K219 Gastro-esophageal reflux disease without esophagitis: Secondary | ICD-10-CM

## 2023-03-25 NOTE — Telephone Encounter (Signed)
Requested Prescriptions  Pending Prescriptions Disp Refills   omeprazole (PRILOSEC) 20 MG capsule [Pharmacy Med Name: OMEPRAZOLE 20MG  CAPSULE DR] 90 capsule 0    Sig: TAKE 1 CAPSULE BY MOUTH ONCE DAILY     Gastroenterology: Proton Pump Inhibitors Failed - 03/24/2023  9:34 AM      Failed - Valid encounter within last 12 months    Recent Outpatient Visits           1 year ago Controlled type 2 diabetes mellitus with chronic kidney disease, unspecified CKD stage, unspecified whether long term insulin use (HCC)   [facility] Pickard, Priscille Heidelberg, MD   2 years ago General medical exam   [facility] Donita Brooks, MD   3 years ago General medical exam   [facility] Donita Brooks, MD   4 years ago Shortness of breath   [facility] Tanya Nones, Priscille Heidelberg, MD   5 years ago Shortness of breath   [facility] Pickard, Priscille Heidelberg, MD       Future Appointments             In 1 week Pickard, Priscille Heidelberg, MD [facility]

## 2023-03-29 ENCOUNTER — Other Ambulatory Visit: Payer: Self-pay | Admitting: Family Medicine

## 2023-03-30 NOTE — Telephone Encounter (Signed)
Future OV scheduled 04/04/23.  Requested Prescriptions  Pending Prescriptions Disp Refills   furosemide (LASIX) 40 MG tablet [Pharmacy Med Name: FUROSEMIDE 40MG  TABLET] 180 tablet 0    Sig: TAKE ONE TABLET BY MOUTH TWICE A DAY     Cardiovascular:  Diuretics - Loop Failed - 03/29/2023  4:55 PM      Failed - K in normal range and within 180 days    Potassium  Date Value Ref Range Status  07/09/2022 4.2 3.5 - 5.3 mmol/L Final  12/25/2015 3.8 3.5 - 5.1 mEq/L Final         Failed - Ca in normal range and within 180 days    Calcium  Date Value Ref Range Status  07/09/2022 9.8 8.6 - 10.3 mg/dL Final  16/04/9603 9.0 8.4 - 10.4 mg/dL Final         Failed - Na in normal range and within 180 days    Sodium  Date Value Ref Range Status  07/09/2022 140 135 - 146 mmol/L Final  12/25/2015 141 136 - 145 mEq/L Final         Failed - Cr in normal range and within 180 days    Creatinine  Date Value Ref Range Status  12/25/2015 1.4 (H) 0.7 - 1.3 mg/dL Final   Creat  Date Value Ref Range Status  07/09/2022 1.24 0.70 - 1.28 mg/dL Final   Creatinine,U  Date Value Ref Range Status  03/17/2009 155.8 mg/dL Final         Failed - Cl in normal range and within 180 days    Chloride  Date Value Ref Range Status  07/09/2022 101 98 - 110 mmol/L Final  12/25/2015 105 98 - 109 mEq/L Final         Failed - Mg Level in normal range and within 180 days    No results found for: "MG"       Failed - Valid encounter within last 6 months    Recent Outpatient Visits           1 year ago Controlled type 2 diabetes mellitus with chronic kidney disease, unspecified CKD stage, unspecified whether long term insulin use (HCC)   [facility] Pickard, Priscille Heidelberg, MD   2 years ago General medical exam   [facility] Donita Brooks, MD   3 years ago General medical exam   [facility] Donita Brooks, MD   4 years ago Shortness of breath   [facility] Donita Brooks, MD   5 years ago Shortness of breath   [facility] Pickard, Priscille Heidelberg, MD       Future Appointments             In 5 days Tanya Nones Priscille Heidelberg, MD [facility]            Passed - Last BP in normal range    BP Readings from Last 1 Encounters:  07/09/22 132/72

## 2023-04-04 ENCOUNTER — Ambulatory Visit: Payer: 59 | Admitting: Family Medicine

## 2023-04-04 ENCOUNTER — Encounter: Payer: Self-pay | Admitting: Family Medicine

## 2023-04-04 VITALS — BP 148/84 | HR 77 | Temp 97.7°F | Ht 75.0 in | Wt 315.4 lb

## 2023-04-04 DIAGNOSIS — E1165 Type 2 diabetes mellitus with hyperglycemia: Secondary | ICD-10-CM

## 2023-04-04 DIAGNOSIS — Z794 Long term (current) use of insulin: Secondary | ICD-10-CM
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
## 2023-04-06 LAB — HEMOGLOBIN A1C
Hgb A1c MFr Bld: 8.5 %{Hb} — ABNORMAL HIGH (ref <5.7)
Mean Plasma Glucose: 197 mg/dL
eAG (mmol/L): 10.9 mmol/L

## 2023-04-06 LAB — COMPLETE METABOLIC PANEL WITH GFR
AG Ratio: 1.6 (calc) (ref 1.0–2.5)
ALT: 22 U/L (ref 9–46)
AST: 18 U/L (ref 10–35)
Albumin: 4.3 g/dL (ref 3.6–5.1)
Alkaline phosphatase (APISO): 71 U/L (ref 35–144)
BUN: 15 mg/dL (ref 7–25)
CO2: 27 mmol/L (ref 20–32)
Calcium: 9.5 mg/dL (ref 8.6–10.3)
Chloride: 99 mmol/L (ref 98–110)
Creat: 1.23 mg/dL (ref 0.70–1.28)
Globulin: 2.7 g/dL (ref 1.9–3.7)
Glucose, Bld: 304 mg/dL — ABNORMAL HIGH (ref 65–99)
Potassium: 4.5 mmol/L (ref 3.5–5.3)
Sodium: 138 mmol/L (ref 135–146)
Total Bilirubin: 0.6 mg/dL (ref 0.2–1.2)
Total Protein: 7 g/dL (ref 6.1–8.1)
eGFR: 62 mL/min/{1.73_m2} (ref >=60)

## 2023-04-06 LAB — CBC WITH DIFFERENTIAL/PLATELET
Absolute Monocytes: 854 {cells}/uL (ref 200–950)
Basophils Absolute: 35 {cells}/uL (ref 0–200)
Basophils Relative: 0.3 %
Eosinophils Absolute: 176 {cells}/uL (ref 15–500)
Eosinophils Relative: 1.5 %
HCT: 50.6 % — ABNORMAL HIGH (ref 38.5–50.0)
Hemoglobin: 16.9 g/dL (ref 13.2–17.1)
Lymphs Abs: 2048 {cells}/uL (ref 850–3900)
MCH: 30.4 pg (ref 27.0–33.0)
MCHC: 33.4 g/dL (ref 32.0–36.0)
MCV: 91 fL (ref 80.0–100.0)
MPV: 9.6 fL (ref 7.5–12.5)
Monocytes Relative: 7.3 %
Neutro Abs: 8588 {cells}/uL — ABNORMAL HIGH (ref 1500–7800)
Neutrophils Relative %: 73.4 %
Platelets: 299 10*3/uL (ref 140–400)
RBC: 5.56 10*6/uL (ref 4.20–5.80)
RDW: 12.7 % (ref 11.0–15.0)
Total Lymphocyte: 17.5 %
WBC: 11.7 10*3/uL — ABNORMAL HIGH (ref 3.8–10.8)

## 2023-04-06 LAB — PROTEIN / CREATININE RATIO, URINE

## 2023-04-07 ENCOUNTER — Telehealth: Payer: Self-pay

## 2023-04-07 NOTE — Telephone Encounter (Signed)
Pt's wife called in stating that she missed a call from office to discuss pt's lab results. Pt's wife would like a cb from nurse please about results when possible.   Cb#: [phone]

## 2023-04-08 NOTE — Telephone Encounter (Signed)
Called pt back, spoke w/wife re lab results and pcp's recommendations. Wife voiced understanding and agrees w/plan. Wife, [name], will advice pt. Per pcp, referral to Ednocrinology has been placed.

## 2023-04-25 ENCOUNTER — Other Ambulatory Visit: Payer: Self-pay | Admitting: Family Medicine

## 2023-04-26 NOTE — Telephone Encounter (Signed)
Requested medication (s) are due for refill today: yes  Requested medication (s) are on the active medication list: yes  Last refill:  valium- 02/28/23 #60 1 refill, ambien- 02/28/23 #30 1 refills  Future visit scheduled: no  Notes to clinic:  not delegated per protocol. Do you want to refill Rxs? LOV 04/04/23     Requested Prescriptions  Pending Prescriptions Disp Refills   diazepam (VALIUM) 5 MG tablet [Pharmacy Med Name: DIAZEPAM 5MG  TABLET] 60 tablet 1    Sig: TAKE ONE TABLET BY MOUTH EVERY SIX HOURS AS NEEDED FOR ANXIETY. DO NOT TAKE WITH AMBIEN     Not Delegated - Psychiatry: Anxiolytics/Hypnotics 2 Failed - 04/25/2023 11:31 AM      Failed - This refill cannot be delegated      Failed - Urine Drug Screen completed in last 360 days      Failed - Valid encounter within last 6 months    Recent Outpatient Visits           1 year ago Controlled type 2 diabetes mellitus with chronic kidney disease, unspecified CKD stage, unspecified whether long term insulin use (HCC)   [facility] Pickard, Priscille Heidelberg, MD   2 years ago General medical exam   [facility] Donita Brooks, MD   3 years ago General medical exam   [facility] Donita Brooks, MD   4 years ago Shortness of breath   [facility] Tanya Nones, Priscille Heidelberg, MD   5 years ago Shortness of breath   [facility] Pickard, Priscille Heidelberg, MD              Passed - Patient is not pregnant       zolpidem (AMBIEN CR) 12.5 MG CR tablet [Pharmacy Med Name: ZOLPIDEM TARTRATE ER 12.5MG  TABLET ER] 30 tablet 1    Sig: TAKE ONE TABLET (12.5 MG TOTAL) BY MOUTH AT BEDTIME.     Not Delegated - Psychiatry:  Anxiolytics/Hypnotics Failed - 04/25/2023 11:31 AM      Failed - This refill cannot be delegated      Failed - Urine Drug Screen completed in last 360 days      Failed - Valid encounter within last 6 months    Recent Outpatient Visits           1 year  ago Controlled type 2 diabetes mellitus with chronic kidney disease, unspecified CKD stage, unspecified whether long term insulin use (HCC)   [facility] Pickard, Priscille Heidelberg, MD   2 years ago General medical exam   [facility] Donita Brooks, MD   3 years ago General medical exam   [facility] Donita Brooks, MD   4 years ago Shortness of breath   [facility] Pickard, Priscille Heidelberg, MD   5 years ago Shortness of breath   [facility] Pickard, Priscille Heidelberg, MD

## 2023-04-27 ENCOUNTER — Other Ambulatory Visit: Payer: Self-pay | Admitting: Family Medicine

## 2023-04-28 ENCOUNTER — Other Ambulatory Visit: Payer: Self-pay | Admitting: Family Medicine

## 2023-04-28 ENCOUNTER — Telehealth: Payer: Self-pay

## 2023-04-28 MED ORDER — DIAZEPAM 5 MG PO TABS: ORAL_TABLET | ORAL | 1 refills | Status: DC

## 2023-04-28 MED ORDER — ZOLPIDEM TARTRATE ER 12.5 MG PO TBCR: 12.5000 mg | EXTENDED_RELEASE_TABLET | Freq: Every day | ORAL | 1 refills | Status: DC

## 2023-04-28 NOTE — Telephone Encounter (Signed)
Requested Prescriptions  Pending Prescriptions Disp Refills   insulin glargine (LANTUS SOLOSTAR) 100 UNIT/ML Solostar Pen [Pharmacy Med Name: LANTUS SOLOSTAR 100/ML SOLN PEN-INJ] 75 mL 0    Sig: INJECT SUBCUTANEOUSLY 75 UNITS EVERY MORNING     Endocrinology:  Diabetes - Insulins Failed - 04/27/2023  8:51 AM      Failed - HBA1C is between 0 and 7.9 and within 180 days    Hgb A1c MFr Bld  Date Value Ref Range Status  04/04/2023 8.5 (H) <5.7 % of total Hgb Final    Comment:    For someone without known diabetes, a hemoglobin A1c value of 6.5% or greater indicates that they may have  diabetes and this should be confirmed with a follow-up  test. . For someone with known diabetes, a value <7% indicates  that their diabetes is well controlled and a value  greater than or equal to 7% indicates suboptimal  control. A1c targets should be individualized based on  duration of diabetes, age, comorbid conditions, and  other considerations. . Currently, no consensus exists regarding use of hemoglobin A1c for diagnosis of diabetes for children. .          Failed - Valid encounter within last 6 months    Recent Outpatient Visits           1 year ago Controlled type 2 diabetes mellitus with chronic kidney disease, unspecified CKD stage, unspecified whether long term insulin use (HCC)   [facility] Pickard, Priscille Heidelberg, MD   2 years ago General medical exam   [facility] Donita Brooks, MD   3 years ago General medical exam   [facility] Donita Brooks, MD   4 years ago Shortness of breath   [facility] Tanya Nones Priscille Heidelberg, MD   5 years ago Shortness of breath   [facility] Pickard, Priscille Heidelberg, MD

## 2023-04-28 NOTE — Telephone Encounter (Signed)
Pt's wife called in wanting to speak with nurse. Pt's wife is concerned about pt's dosage of Lantus. Pt has been more confused and his sugar has been dropping. Pt's wife would also like for referral to endocrinologist to be sent to an office in [place] please. Pt is almost completely out of this med diazepam (VALIUM) 5 MG. A refill has been sent in for this med but has not been received yet.  Please contact Zaviyar Virella at [phone] or nurse may leave a message via pt's MyChart

## 2023-05-03 ENCOUNTER — Other Ambulatory Visit: Payer: Self-pay | Admitting: Family Medicine

## 2023-05-03 MED ORDER — AMOXICILLIN 875 MG PO TABS: 875.0000 mg | ORAL_TABLET | Freq: Two times a day (BID) | ORAL | 0 refills | Status: AC

## 2023-05-30 ENCOUNTER — Encounter: Payer: Self-pay | Admitting: Family Medicine

## 2023-05-30 ENCOUNTER — Other Ambulatory Visit: Payer: Self-pay | Admitting: Family Medicine

## 2023-05-30 ENCOUNTER — Other Ambulatory Visit: Payer: Self-pay

## 2023-05-30 DIAGNOSIS — K219 Gastro-esophageal reflux disease without esophagitis: Secondary | ICD-10-CM

## 2023-05-30 DIAGNOSIS — E1165 Type 2 diabetes mellitus with hyperglycemia: Secondary | ICD-10-CM

## 2023-05-30 MED ORDER — OMEPRAZOLE 20 MG PO CPDR: 20.0000 mg | DELAYED_RELEASE_CAPSULE | Freq: Every day | ORAL | 0 refills | Status: DC

## 2023-05-30 MED ORDER — DIAZEPAM 5 MG PO TABS: ORAL_TABLET | ORAL | 1 refills | Status: DC

## 2023-05-30 MED ORDER — INSULIN LISPRO (1 UNIT DIAL) 100 UNIT/ML (KWIKPEN): PEN_INJECTOR | SUBCUTANEOUS | 3 refills | Status: DC

## 2023-05-30 MED ORDER — ZOLPIDEM TARTRATE ER 12.5 MG PO TBCR: 12.5000 mg | EXTENDED_RELEASE_TABLET | Freq: Every day | ORAL | 1 refills | Status: DC

## 2023-05-31 NOTE — Telephone Encounter (Signed)
Reordered 05/30/23 #90 Omeprazole Reordered 05/30/23 150 ml 3 ml Humalog Requested Prescriptions  Refused Prescriptions Disp Refills   omeprazole (PRILOSEC) 20 MG capsule [Pharmacy Med Name: OMEPRAZOLE 20MG  CAPSULE DR] 90 capsule 0    Sig: TAKE ONE CAPSULE BY MOUTH ONCE DAILY     Gastroenterology: Proton Pump Inhibitors Failed - 05/30/2023  9:05 AM      Failed - Valid encounter within last 12 months    Recent Outpatient Visits           1 year ago Controlled type 2 diabetes mellitus with chronic kidney disease, unspecified CKD stage, unspecified whether long term insulin use (HCC)   [facility] Pickard, Priscille Heidelberg, MD   2 years ago General medical exam   [facility] Donita Brooks, MD   3 years ago General medical exam   [facility] Donita Brooks, MD   5 years ago Shortness of breath   [facility] Tanya Nones, Priscille Heidelberg, MD   5 years ago Shortness of breath   [facility] Pickard, Priscille Heidelberg, MD               insulin lispro (HUMALOG) 100 UNIT/ML KwikPen [Pharmacy Med Name: INSULIN LISPRO KWIKPEN 100/ML SOLN PEN-INJ] 150 mL 3    Sig: INJECT SUBCUTANEOUSLY 80 UNITS EVERY MORNING, 60 UNITS EVERY NOON AND 25 UNITS EVERY NIGHT AT BEDTIME PER SLIDING SCALE     Endocrinology:  Diabetes - Insulins Failed - 05/30/2023  9:05 AM      Failed - HBA1C is between 0 and 7.9 and within 180 days    Hgb A1c MFr Bld  Date Value Ref Range Status  04/04/2023 8.5 (H) <5.7 % of total Hgb Final    Comment:    For someone without known diabetes, a hemoglobin A1c value of 6.5% or greater indicates that they may have  diabetes and this should be confirmed with a follow-up  test. . For someone with known diabetes, a value <7% indicates  that their diabetes is well controlled and a value  greater than or equal to 7% indicates suboptimal  control. A1c targets should be individualized based on  duration of diabetes,  age, comorbid conditions, and  other considerations. . Currently, no consensus exists regarding use of hemoglobin A1c for diagnosis of diabetes for children. .          Failed - Valid encounter within last 6 months    Recent Outpatient Visits           1 year ago Controlled type 2 diabetes mellitus with chronic kidney disease, unspecified CKD stage, unspecified whether long term insulin use (HCC)   [facility] Pickard, Priscille Heidelberg, MD   2 years ago General medical exam   [facility] Donita Brooks, MD   3 years ago General medical exam   [facility] Donita Brooks, MD   5 years ago Shortness of breath   [facility] Tanya Nones Priscille Heidelberg, MD   5 years ago Shortness of breath   [facility] Pickard, Priscille Heidelberg, MD

## 2023-06-10 ENCOUNTER — Other Ambulatory Visit: Payer: Self-pay | Admitting: Family Medicine

## 2023-06-10 DIAGNOSIS — E1169 Type 2 diabetes mellitus with other specified complication: Secondary | ICD-10-CM

## 2023-06-11 ENCOUNTER — Other Ambulatory Visit: Payer: Self-pay | Admitting: Family Medicine

## 2023-06-13 NOTE — Telephone Encounter (Signed)
  Requested Prescriptions  Pending Prescriptions Disp Refills   potassium chloride SA (KLOR-CON M) 20 MEQ tablet [Pharmacy Med Name: POTASSIUM CHLORIDE ER ER TABLET ER] 180 tablet 0    Sig: TAKE ONE TABLET BY MOUTH TWICE A DAY     Endocrinology:  Minerals - Potassium Supplementation Failed - 06/11/2023  9:24 AM      Failed - Valid encounter within last 12 months    Recent Outpatient Visits           1 year ago Controlled type 2 diabetes mellitus with chronic kidney disease, unspecified CKD stage, unspecified whether long term insulin use (HCC)   [facility] Pickard, Priscille Heidelberg, MD   2 years ago General medical exam   [facility] Donita Brooks, MD   3 years ago General medical exam   [facility] Donita Brooks, MD   5 years ago Shortness of breath   [facility] Donita Brooks, MD   5 years ago Shortness of breath   [facility] Pickard, Priscille Heidelberg, MD              Passed - K in normal range and within 360 days    Potassium  Date Value Ref Range Status  04/04/2023 4.5 3.5 - 5.3 mmol/L Final  12/25/2015 3.8 3.5 - 5.1 mEq/L Final         Passed - Cr in normal range and within 360 days    Creatinine  Date Value Ref Range Status  12/25/2015 1.4 (H) 0.7 - 1.3 mg/dL Final   Creat  Date Value Ref Range Status  04/04/2023 1.23 0.70 - 1.28 mg/dL Final   Creatinine,U  Date Value Ref Range Status  03/17/2009 155.8 mg/dL Final   Creatinine, Urine  Date Value Ref Range Status  04/04/2023 CANCELED      Comment:    TEST NOT PERFORMED . No urine received.  Result canceled by the ancillary.

## 2023-06-13 NOTE — Telephone Encounter (Signed)
Requested medications are due for refill today.  yes  Requested medications are on the active medications list.  yes  Last refill. 03/30/2022 #90 3rf  Future visit scheduled.   no  Notes to clinic.  Labs are expired.    Requested Prescriptions  Pending Prescriptions Disp Refills   pravastatin (PRAVACHOL) 40 MG tablet [Pharmacy Med Name: PRAVASTATIN SODIUM 40MG  TABLET] 90 tablet 3    Sig: TAKE ONE TABLET BY MOUTH ONCE A DAY (NEED APPT FOR FUTURE REFILLS)     Cardiovascular:  Antilipid - Statins Failed - 06/10/2023  5:38 PM      Failed - Valid encounter within last 12 months    Recent Outpatient Visits           1 year ago Controlled type 2 diabetes mellitus with chronic kidney disease, unspecified CKD stage, unspecified whether long term insulin use (HCC)   [facility] Pickard, Priscille Heidelberg, MD   2 years ago General medical exam   [facility] Donita Brooks, MD   3 years ago General medical exam   [facility] Donita Brooks, MD   5 years ago Shortness of breath   [facility] Tanya Nones, Priscille Heidelberg, MD   5 years ago Shortness of breath   [facility] Tanya Nones, Priscille Heidelberg, MD              Failed - Lipid Panel in normal range within the last 12 months    Cholesterol  Date Value Ref Range Status  12/01/2021 166 <200 mg/dL Final   LDL Cholesterol (Calc)  Date Value Ref Range Status  12/01/2021 82 mg/dL (calc) Final    Comment:    Reference range: <100 . Desirable range <100 mg/dL for primary prevention;   <70 mg/dL for patients with CHD or diabetic patients  with > or = 2 CHD risk factors. Marland Kitchen LDL-C is now calculated using the Martin-Hopkins  calculation, which is a validated novel method providing  better accuracy than the Friedewald equation in the  estimation of LDL-C.  Horald Pollen et al. Lenox Ahr. 2025;427(06): 2061-2068  (http://education.[employer].com/faq/FAQ164)    HDL  Date  Value Ref Range Status  12/01/2021 53 > OR = 40 mg/dL Final   Triglycerides  Date Value Ref Range Status  12/01/2021 213 (H) <150 mg/dL Final    Comment:    . If a non-fasting specimen was collected, consider repeat triglyceride testing on a fasting specimen if clinically indicated.  Perry Mount et al. J. of Clin. Lipidol. 2015;9:129-169. Marland Kitchen          Passed - Patient is not pregnant

## 2023-06-14 ENCOUNTER — Other Ambulatory Visit: Payer: Self-pay | Admitting: Family Medicine

## 2023-06-14 DIAGNOSIS — E1169 Type 2 diabetes mellitus with other specified complication: Secondary | ICD-10-CM

## 2023-06-14 MED ORDER — PRAVASTATIN SODIUM 40 MG PO TABS: ORAL_TABLET | ORAL | 3 refills | Status: AC

## 2023-06-14 NOTE — Telephone Encounter (Signed)
Copied from CRM [id number]. Topic: Clinical - Medication Refill >> Jun 14, 2023  2:08 PM Fuller Mandril wrote: Most Recent Primary Care Visit:  Provider: Lynnea Ferrier T  Department[facility]  Visit Type: OFFICE VISIT  Date: 04/04/2023  Medication:   Has the patient contacted their pharmacy?  (Agent: If no, request that the patient contact the pharmacy for the refill. If patient does not wish to contact the pharmacy document the reason why and proceed with request.) (Agent: If yes, when and what did the pharmacy advise?)  Is this the correct pharmacy for this prescription?  If no, delete pharmacy and type the correct one.  This is the patient's preferred pharmacy:  [facility] - [place], [place] - [address] [place] [address] Phone: [phone] Fax: [phone]   Has the prescription been filled recently?   Is the patient out of the medication?   Has the patient been seen for an appointment in the last year OR does the patient have an upcoming appointment?   Can we respond through MyChart?   Agent: Please be advised that Rx refills may take up to 3 business days. We ask that you follow-up with your pharmacy.

## 2023-06-17 ENCOUNTER — Other Ambulatory Visit: Payer: Self-pay | Admitting: Family Medicine

## 2023-06-17 DIAGNOSIS — I10 Essential (primary) hypertension: Secondary | ICD-10-CM

## 2023-06-17 NOTE — Telephone Encounter (Signed)
Last OV- 04/04/23 Requested Prescriptions  Pending Prescriptions Disp Refills   cloNIDine (CATAPRES) 0.3 MG tablet [Pharmacy Med Name: CLONIDINE HYDROCHLORIDE 0.3MG  TABLET] 270 tablet 0    Sig: TAKE ONE TABLET BY MOUTH THREE TIMES DAILY.     Cardiovascular:  Alpha-2 Agonists Failed - 06/17/2023 12:32 PM      Failed - Last BP in normal range    BP Readings from Last 1 Encounters:  04/04/23 (!) 148/84         Failed - Valid encounter within last 6 months    Recent Outpatient Visits           1 year ago Controlled type 2 diabetes mellitus with chronic kidney disease, unspecified CKD stage, unspecified whether long term insulin use (HCC)   [facility] Pickard, Priscille Heidelberg, MD   2 years ago General medical exam   [facility] Donita Brooks, MD   3 years ago General medical exam   [facility] Donita Brooks, MD   5 years ago Shortness of breath   [facility] Tanya Nones, Priscille Heidelberg, MD   5 years ago Shortness of breath   [facility] Pickard, Priscille Heidelberg, MD              Passed - Last Heart Rate in normal range    Pulse Readings from Last 1 Encounters:  04/04/23 77          furosemide (LASIX) 40 MG tablet [Pharmacy Med Name: FUROSEMIDE 40MG  TABLET] 180 tablet 0    Sig: TAKE ONE TABLET BY MOUTH TWICE A DAY     Cardiovascular:  Diuretics - Loop Failed - 06/17/2023 12:32 PM      Failed - Mg Level in normal range and within 180 days    No results found for: "MG"       Failed - Last BP in normal range    BP Readings from Last 1 Encounters:  04/04/23 (!) 148/84         Failed - Valid encounter within last 6 months    Recent Outpatient Visits           1 year ago Controlled type 2 diabetes mellitus with chronic kidney disease, unspecified CKD stage, unspecified whether long term insulin use (HCC)   [facility] Pickard, Priscille Heidelberg, MD   2 years ago General medical exam   [facility] Donita Brooks, MD   3 years ago General medical exam   [facility] Donita Brooks, MD   5 years ago Shortness of breath   [facility] Tanya Nones, Priscille Heidelberg, MD   5 years ago Shortness of breath   [facility] Pickard, Priscille Heidelberg, MD              Passed - K in normal range and within 180 days    Potassium  Date Value Ref Range Status  04/04/2023 4.5 3.5 - 5.3 mmol/L Final  12/25/2015 3.8 3.5 - 5.1 mEq/L Final         Passed - Ca in normal range and within 180 days    Calcium  Date Value Ref Range Status  04/04/2023 9.5 8.6 - 10.3 mg/dL Final  16/04/9603 9.0 8.4 - 10.4 mg/dL Final         Passed - Na in normal range and within 180 days    Sodium  Date Value  Ref Range Status  04/04/2023 138 135 - 146 mmol/L Final  12/25/2015 141 136 - 145 mEq/L Final         Passed - Cr in normal range and within 180 days    Creatinine  Date Value Ref Range Status  12/25/2015 1.4 (H) 0.7 - 1.3 mg/dL Final   Creat  Date Value Ref Range Status  04/04/2023 1.23 0.70 - 1.28 mg/dL Final   Creatinine,U  Date Value Ref Range Status  03/17/2009 155.8 mg/dL Final   Creatinine, Urine  Date Value Ref Range Status  04/04/2023 CANCELED      Comment:    TEST NOT PERFORMED . No urine received.  Result canceled by the ancillary.          Passed - Cl in normal range and within 180 days    Chloride  Date Value Ref Range Status  04/04/2023 99 98 - 110 mmol/L Final  12/25/2015 105 98 - 109 mEq/L Final          Insulin Pen Needle (B-D UF III MINI PEN NEEDLES) 31G X 5 MM MISC [Pharmacy Med Name: BD PEN NEEDLE/MINI/ULTRA-FINE/31G X 31GX5MM MISC] 100 each 1    Sig: USE AS DIRECTED WITH INSULIN FOUR TIMES A DAY     Endocrinology: Diabetes - Testing Supplies Failed - 06/17/2023 12:32 PM      Failed - Valid encounter within last 12 months    Recent Outpatient Visits           1 year ago Controlled type 2  diabetes mellitus with chronic kidney disease, unspecified CKD stage, unspecified whether long term insulin use (HCC)   [facility] Pickard, Priscille Heidelberg, MD   2 years ago General medical exam   [facility] Donita Brooks, MD   3 years ago General medical exam   [facility] Donita Brooks, MD   5 years ago Shortness of breath   [facility] Pickard, Priscille Heidelberg, MD   5 years ago Shortness of breath   [facility] Pickard, Priscille Heidelberg, MD

## 2023-06-30 ENCOUNTER — Other Ambulatory Visit: Payer: Self-pay

## 2023-06-30 DIAGNOSIS — E1165 Type 2 diabetes mellitus with hyperglycemia: Secondary | ICD-10-CM

## 2023-06-30 MED ORDER — INSULIN LISPRO (1 UNIT DIAL) 100 UNIT/ML (KWIKPEN): PEN_INJECTOR | SUBCUTANEOUS | 3 refills | Status: DC

## 2023-07-01 ENCOUNTER — Other Ambulatory Visit: Payer: Self-pay | Admitting: Family Medicine

## 2023-07-01 ENCOUNTER — Other Ambulatory Visit: Payer: Self-pay

## 2023-07-01 DIAGNOSIS — I10 Essential (primary) hypertension: Secondary | ICD-10-CM

## 2023-07-01 MED ORDER — NEBIVOLOL HCL 5 MG PO TABS: 5.0000 mg | ORAL_TABLET | Freq: Every day | ORAL | 0 refills | Status: DC

## 2023-07-04 ENCOUNTER — Telehealth: Payer: Self-pay

## 2023-07-04 ENCOUNTER — Other Ambulatory Visit: Payer: Self-pay

## 2023-07-04 DIAGNOSIS — E1165 Type 2 diabetes mellitus with hyperglycemia: Secondary | ICD-10-CM

## 2023-07-04 MED ORDER — ONETOUCH ULTRA TEST VI STRP: ORAL_STRIP | 3 refills | Status: DC

## 2023-07-04 NOTE — Telephone Encounter (Signed)
Copied from CRM [id number]. Topic: Clinical - Prescription Issue >> Jul 04, 2023 10:56 AM Fonda Kinder J wrote: Reason for CRM: Pts pharmacy states they still have not received an authorization to fill [facility] ULTRA TEST test strip. He states someone told him it was completed but he still has not gotten it

## 2023-07-20 ENCOUNTER — Other Ambulatory Visit: Payer: Self-pay | Admitting: Family Medicine

## 2023-08-01 ENCOUNTER — Ambulatory Visit: Payer: 59 | Admitting: Nurse Practitioner

## 2023-08-18 ENCOUNTER — Other Ambulatory Visit: Payer: Self-pay | Admitting: Family Medicine

## 2023-08-18 DIAGNOSIS — I10 Essential (primary) hypertension: Secondary | ICD-10-CM

## 2023-08-24 ENCOUNTER — Other Ambulatory Visit: Payer: Self-pay | Admitting: Family Medicine

## 2023-09-08 ENCOUNTER — Other Ambulatory Visit: Payer: Self-pay | Admitting: Family Medicine

## 2023-09-19 ENCOUNTER — Other Ambulatory Visit: Payer: Self-pay | Admitting: Family Medicine

## 2023-09-19 DIAGNOSIS — I10 Essential (primary) hypertension: Secondary | ICD-10-CM

## 2023-09-20 ENCOUNTER — Other Ambulatory Visit: Payer: Self-pay | Admitting: Family Medicine

## 2023-09-20 DIAGNOSIS — I10 Essential (primary) hypertension: Secondary | ICD-10-CM

## 2023-09-20 NOTE — Telephone Encounter (Signed)
 Requested Prescriptions  Pending Prescriptions Disp Refills   cloNIDine (CATAPRES) 0.3 MG tablet [Pharmacy Med Name: CLONIDINE HYDROCHLORIDE 0.3MG  TABLET] 270 tablet 0    Sig: TAKE ONE TABLET BY MOUTH THREE TIMES DAILY.     Cardiovascular:  Alpha-2 Agonists Failed - 09/20/2023  2:32 PM      Failed - Last BP in normal range    BP Readings from Last 1 Encounters:  04/04/23 (!) 148/84         Failed - Valid encounter within last 6 months    Recent Outpatient Visits           1 year ago Controlled type 2 diabetes mellitus with chronic kidney disease, unspecified CKD stage, unspecified whether long term insulin use (HCC)   [facility] Pickard, Priscille Heidelberg, MD   2 years ago General medical exam   [facility] Donita Brooks, MD   4 years ago General medical exam   [facility] Donita Brooks, MD   5 years ago Shortness of breath   [facility] Tanya Nones, Priscille Heidelberg, MD   5 years ago Shortness of breath   [facility] Pickard, Priscille Heidelberg, MD              Passed - Last Heart Rate in normal range    Pulse Readings from Last 1 Encounters:  04/04/23 77

## 2023-09-21 ENCOUNTER — Other Ambulatory Visit: Payer: Self-pay | Admitting: Family Medicine

## 2023-09-21 ENCOUNTER — Encounter: Payer: Self-pay | Admitting: Family Medicine

## 2023-09-21 ENCOUNTER — Other Ambulatory Visit: Payer: Self-pay

## 2023-09-21 DIAGNOSIS — I10 Essential (primary) hypertension: Secondary | ICD-10-CM

## 2023-09-21 MED ORDER — AMLODIPINE BESY-BENAZEPRIL HCL 10-40 MG PO CAPS: 1.0000 | ORAL_CAPSULE | Freq: Every day | ORAL | 1 refills | Status: DC

## 2023-09-21 NOTE — Telephone Encounter (Signed)
 Requested medication (s) are due for refill today: yes  Requested medication (s) are on the active medication list: yes  Last refill:  08/18/23 #30/0  Future visit scheduled: yes  Notes to clinic:  pt due for OV, has appt 09/23/23     Requested Prescriptions  Pending Prescriptions Disp Refills   amLODipine-benazepril (LOTREL) 10-40 MG capsule [Pharmacy Med Name: AMLODIPINE BESYLATE/BENAZEPRIL HYDROCHLORIDE 10-40MG  CAPSULE] 30 capsule 0    Sig: TAKE ONE CAPSULE BY MOUTH ONCE DAILY (COURTESY REFILL. PATIENT NEEDS APPOINTMENT W/PCP FOR FUTURE REFILLS)     Cardiovascular: CCB + ACEI Combos Failed - 09/21/2023  3:20 PM      Failed - Last BP in normal range    BP Readings from Last 1 Encounters:  04/04/23 (!) 148/84         Failed - Valid encounter within last 6 months    Recent Outpatient Visits           1 year ago Controlled type 2 diabetes mellitus with chronic kidney disease, unspecified CKD stage, unspecified whether long term insulin use (HCC)   [facility] Pickard, Priscille Heidelberg, MD   2 years ago General medical exam   [facility] Donita Brooks, MD   4 years ago General medical exam   [facility] Donita Brooks, MD   5 years ago Shortness of breath   [facility] Medicine Tanya Nones, Priscille Heidelberg, MD   5 years ago Shortness of breath   [facility] Pickard, Priscille Heidelberg, MD              Passed - Cr in normal range and within 180 days    Creatinine  Date Value Ref Range Status  12/25/2015 1.4 (H) 0.7 - 1.3 mg/dL Final   Creat  Date Value Ref Range Status  04/04/2023 1.23 0.70 - 1.28 mg/dL Final   Creatinine,U  Date Value Ref Range Status  03/17/2009 155.8 mg/dL Final   Creatinine, Urine  Date Value Ref Range Status  04/04/2023 CANCELED      Comment:    TEST NOT PERFORMED . No urine received.  Result canceled by the ancillary.          Passed - K in normal range and within 180 days     Potassium  Date Value Ref Range Status  04/04/2023 4.5 3.5 - 5.3 mmol/L Final  12/25/2015 3.8 3.5 - 5.1 mEq/L Final         Passed - Na in normal range and within 180 days    Sodium  Date Value Ref Range Status  04/04/2023 138 135 - 146 mmol/L Final  12/25/2015 141 136 - 145 mEq/L Final         Passed - eGFR is 30 or above and within 180 days    GFR, Est African American  Date Value Ref Range Status  10/20/2020 55 (L) > OR = 60 mL/min/1.40m2 Final   GFR, Est Non African American  Date Value Ref Range Status  10/20/2020 47 (L) > OR = 60 mL/min/1.21m2 Final   GFR  Date Value Ref Range Status  11/05/2011 61.90 >60.00 mL/min Final   eGFR  Date Value Ref Range Status  04/04/2023 62 > OR = 60 mL/min/1.63m2 Final         Passed - Patient is not pregnant

## 2023-09-23 ENCOUNTER — Ambulatory Visit: Payer: 59 | Admitting: Family Medicine

## 2023-09-26 ENCOUNTER — Telehealth: Payer: Self-pay

## 2023-09-26 NOTE — Telephone Encounter (Signed)
 Copied from CRM [id number]. Topic: Appointments - Appointment Cancel/Reschedule >> Sep 23, 2023  1:25 PM Albin Felling L wrote: Patient/patient representative is calling to cancel or reschedule an appointment. Refer to attachments for appointment information.   Wife calling to cancel appointment today. Patient has already had his bath today. Patient unfortunately could not make it to the bathroom and had a bowel movement. Patient has to take another bath and unable to come in for appointment today. Wife sends her apologies and if any questions requests a mychart message or phone call  Wife requested message be sent to Dr. Tanya Nones and Germaine Pomfret

## 2023-09-27 ENCOUNTER — Other Ambulatory Visit: Payer: Self-pay | Admitting: Family Medicine

## 2023-09-27 DIAGNOSIS — I10 Essential (primary) hypertension: Secondary | ICD-10-CM

## 2023-09-28 ENCOUNTER — Other Ambulatory Visit: Payer: Self-pay | Admitting: Family Medicine

## 2023-09-28 ENCOUNTER — Other Ambulatory Visit: Payer: Self-pay

## 2023-09-28 ENCOUNTER — Telehealth: Payer: Self-pay

## 2023-09-28 DIAGNOSIS — I10 Essential (primary) hypertension: Secondary | ICD-10-CM

## 2023-09-28 MED ORDER — FUROSEMIDE 40 MG PO TABS: 40.0000 mg | ORAL_TABLET | Freq: Two times a day (BID) | ORAL | 0 refills | Status: DC

## 2023-09-28 MED ORDER — NEBIVOLOL HCL 5 MG PO TABS: 5.0000 mg | ORAL_TABLET | Freq: Every day | ORAL | 0 refills | Status: DC

## 2023-09-28 NOTE — Telephone Encounter (Signed)
 Copied from CRM [id number]. Topic: Clinical - Medication Refill >> Sep 28, 2023 11:54 AM Gery Pray wrote: Most Recent Primary Care Visit:  Provider: Lynnea Ferrier T  Department[facility]  Visit Type: OFFICE VISIT  Date: 04/04/2023  Medication: nebivolol (BYSTOLIC) 5 MG tablet, furosemide (LASIX) 40 MG tablet  Has the patient contacted their pharmacy? Yes (Agent: If no, request that the patient contact the pharmacy for the refill. If patient does not wish to contact the pharmacy document the reason why and proceed with request.) (Agent: If yes, when and what did the pharmacy advise?) Pharmacy has sent over several faxes with no response. Please contact Morrie Sheldon at [facility] [phone]  Is this the correct pharmacy for this prescription? Yes If no, delete pharmacy and type the correct one.  This is the patient's preferred pharmacy:  [facility] - [place], [place] - [address] [place] [address] Phone: [phone] Fax: [phone]   Has the prescription been filled recently? No  Is the patient out of the medication? Yes  Has the patient been seen for an appointment in the last year OR does the patient have an upcoming appointment? Yes  Can we respond through MyChart? Yes  Agent: Please be advised that Rx refills may take up to 3 business days. We ask that you follow-up with your pharmacy.

## 2023-09-28 NOTE — Telephone Encounter (Signed)
 The original prescription was reordered on 09/28/2023 by Darral Dash, LPN.   Requested Prescriptions  Pending Prescriptions Disp Refills   nebivolol (BYSTOLIC) 5 MG tablet [Pharmacy Med Name: NEBIVOLOL HYDROCHLORIDE 5MG  TABLET] 90 tablet 0    Sig: TAKE ONE TABLET (5 MG TOTAL) BY MOUTH DAILY.     Cardiovascular: Beta Blockers 3 Failed - 09/28/2023  4:55 PM      Failed - Last BP in normal range    BP Readings from Last 1 Encounters:  04/04/23 (!) 148/84         Passed - Cr in normal range and within 360 days    Creatinine  Date Value Ref Range Status  12/25/2015 1.4 (H) 0.7 - 1.3 mg/dL Final   Creat  Date Value Ref Range Status  04/04/2023 1.23 0.70 - 1.28 mg/dL Final   Creatinine,U  Date Value Ref Range Status  03/17/2009 155.8 mg/dL Final   Creatinine, Urine  Date Value Ref Range Status  04/04/2023 CANCELED      Comment:    TEST NOT PERFORMED . No urine received.  Result canceled by the ancillary.          Passed - AST in normal range and within 360 days    AST  Date Value Ref Range Status  04/04/2023 18 10 - 35 U/L Final  12/25/2015 22 5 - 34 U/L Final         Passed - ALT in normal range and within 360 days    ALT  Date Value Ref Range Status  04/04/2023 22 9 - 46 U/L Final  12/25/2015 29 0 - 55 U/L Final         Passed - Last Heart Rate in normal range    Pulse Readings from Last 1 Encounters:  04/04/23 77         Passed - Valid encounter within last 6 months    Recent Outpatient Visits           5 months ago Uncontrolled type 2 diabetes mellitus with hyperglycemia, with long-term current use of insulin (HCC)   [place] [facility] Donita Brooks, MD   1 year ago Uncontrolled type 2 diabetes mellitus with hypoglycemia without coma [facility])   [place] [facility] Pickard, Priscille Heidelberg, MD   1 year ago Right leg swelling   [place] [facility] Donita Brooks, MD   1 year ago Boil    [place] [facility] Donita Brooks, MD   1 year ago Depression, major, single episode, moderate [facility])   [place] Medicine Pickard, Priscille Heidelberg, MD               furosemide (LASIX) 40 MG tablet [Pharmacy Med Name: FUROSEMIDE 40MG  TABLET] 180 tablet 0    Sig: TAKE ONE TABLET BY MOUTH TWICE A DAY     Cardiovascular:  Diuretics - Loop Failed - 09/28/2023  4:55 PM      Failed - Mg Level in normal range and within 180 days    No results found for: "MG"       Failed - Last BP in normal range    BP Readings from Last 1 Encounters:  04/04/23 (!) 148/84         Passed - K in normal range and within 180 days    Potassium  Date Value Ref Range Status  04/04/2023 4.5 3.5 - 5.3  mmol/L Final  12/25/2015 3.8 3.5 - 5.1 mEq/L Final         Passed - Ca in normal range and within 180 days    Calcium  Date Value Ref Range Status  04/04/2023 9.5 8.6 - 10.3 mg/dL Final  16/04/9603 9.0 8.4 - 10.4 mg/dL Final         Passed - Na in normal range and within 180 days    Sodium  Date Value Ref Range Status  04/04/2023 138 135 - 146 mmol/L Final  12/25/2015 141 136 - 145 mEq/L Final         Passed - Cr in normal range and within 180 days    Creatinine  Date Value Ref Range Status  12/25/2015 1.4 (H) 0.7 - 1.3 mg/dL Final   Creat  Date Value Ref Range Status  04/04/2023 1.23 0.70 - 1.28 mg/dL Final   Creatinine,U  Date Value Ref Range Status  03/17/2009 155.8 mg/dL Final   Creatinine, Urine  Date Value Ref Range Status  04/04/2023 CANCELED      Comment:    TEST NOT PERFORMED . No urine received.  Result canceled by the ancillary.          Passed - Cl in normal range and within 180 days    Chloride  Date Value Ref Range Status  04/04/2023 99 98 - 110 mmol/L Final  12/25/2015 105 98 - 109 mEq/L Final         Passed - Valid encounter within last 6 months    Recent Outpatient Visits           5 months ago Uncontrolled type 2  diabetes mellitus with hyperglycemia, with long-term current use of insulin (HCC)   [place] [facility] Donita Brooks, MD   1 year ago Uncontrolled type 2 diabetes mellitus with hypoglycemia without coma [facility])   [place] [facility] Pickard, Priscille Heidelberg, MD   1 year ago Right leg swelling   [facility] Medicine Pickard, Priscille Heidelberg, MD   1 year ago Boil   [place] [facility] Tanya Nones, Priscille Heidelberg, MD   1 year ago Depression, major, single episode, moderate [facility])   [place] [facility] Medicine Pickard, Priscille Heidelberg, MD

## 2023-10-18 ENCOUNTER — Other Ambulatory Visit: Payer: Self-pay | Admitting: Family Medicine

## 2023-10-18 DIAGNOSIS — K219 Gastro-esophageal reflux disease without esophagitis: Secondary | ICD-10-CM

## 2023-10-19 NOTE — Telephone Encounter (Signed)
 Requested Prescriptions  Pending Prescriptions Disp Refills   zolpidem (AMBIEN CR) 12.5 MG CR tablet [Pharmacy Med Name: ZOLPIDEM TARTRATE ER 12.5MG  TABLET ER] 30 tablet 1    Sig: TAKE ONE TABLET (12.5 MG TOTAL) BY MOUTH AT BEDTIME.     Not Delegated - Psychiatry:  Anxiolytics/Hypnotics Failed - 10/19/2023  9:51 AM      Failed - This refill cannot be delegated      Failed - Urine Drug Screen completed in last 360 days      Failed - Valid encounter within last 6 months    Recent Outpatient Visits           6 months ago Uncontrolled type 2 diabetes mellitus with hyperglycemia, with long-term current use of insulin (HCC)   [place] [facility] Donita Brooks, MD   1 year ago Uncontrolled type 2 diabetes mellitus with hypoglycemia without coma [facility])   [place] [facility] Pickard, Priscille Heidelberg, MD   1 year ago Right leg swelling   [place] [facility] Medicine Pickard, Priscille Heidelberg, MD   1 year ago Boil   [place] [facility] Medicine Tanya Nones, Priscille Heidelberg, MD   1 year ago Depression, major, single episode, moderate [facility])   [place] [facility] Medicine Pickard, Priscille Heidelberg, MD               diazepam (VALIUM) 5 MG tablet [Pharmacy Med Name: DIAZEPAM 5MG  TABLET] 60 tablet 1    Sig: TAKE ONE TABLET BY MOUTH EVERY SIX HOURS AS NEEDED FOR ANXIETY. DO NOT TAKE WITH AMBIEN     Not Delegated - Psychiatry: Anxiolytics/Hypnotics 2 Failed - 10/19/2023  9:51 AM      Failed - This refill cannot be delegated      Failed - Urine Drug Screen completed in last 360 days      Failed - Valid encounter within last 6 months    Recent Outpatient Visits           6 months ago Uncontrolled type 2 diabetes mellitus with hyperglycemia, with long-term current use of insulin (HCC)   [place] [facility] Medicine Donita Brooks, MD   1 year ago Uncontrolled type 2 diabetes mellitus with hypoglycemia without coma [facility])   [facility] Pickard, Priscille Heidelberg, MD   1 year ago Right leg swelling   [place] [facility] Medicine Pickard, Priscille Heidelberg, MD   1 year ago Boil   [place] [facility] Medicine Tanya Nones, Priscille Heidelberg, MD   1 year ago Depression, major, single episode, moderate [facility])   [place] Medicine Pickard, Priscille Heidelberg, MD              Passed - Patient is not pregnant       omeprazole (PRILOSEC) 20 MG capsule [Pharmacy Med Name: OMEPRAZOLE 20MG  CAPSULE DR] 90 capsule 0    Sig: TAKE ONE CAPSULE (20 MG TOTAL) BY MOUTH DAILY.     Gastroenterology: Proton Pump Inhibitors Passed - 10/19/2023  9:51 AM      Passed - Valid encounter within last 12 months    Recent Outpatient Visits           6 months ago Uncontrolled type 2 diabetes mellitus with hyperglycemia, with long-term current use of insulin [facility])   [place] [facility] Medicine Pickard, Priscille Heidelberg, MD   1 year ago  Uncontrolled type 2 diabetes mellitus with hypoglycemia without coma [facility])   [place] [facility] Medicine Pickard, Cisco Crest, MD   1 year ago Right leg swelling   [place] [facility] Pickard, Cisco Crest, MD   1 year ago Boil   [place] [facility] Family Medicine Cheril Cork, Cisco Crest, MD   1 year ago Depression, major, single episode, moderate [facility])   [place] [facility] Family Medicine Pickard, Cisco Crest, MD

## 2023-10-19 NOTE — Telephone Encounter (Signed)
 Requested medication (s) are due for refill today: yes  Requested medication (s) are on the active medication list: yes  Last refill:  08/25/23  Future visit scheduled: yes  Notes to clinic:  Unable to refill per protocol, cannot delegate.      Requested Prescriptions  Pending Prescriptions Disp Refills   zolpidem (AMBIEN CR) 12.5 MG CR tablet [Pharmacy Med Name: ZOLPIDEM TARTRATE ER 12.5MG  TABLET ER] 30 tablet 1    Sig: TAKE ONE TABLET (12.5 MG TOTAL) BY MOUTH AT BEDTIME.     Not Delegated - Psychiatry:  Anxiolytics/Hypnotics Failed - 10/19/2023  9:51 AM      Failed - This refill cannot be delegated      Failed - Urine Drug Screen completed in last 360 days      Failed - Valid encounter within last 6 months    Recent Outpatient Visits           6 months ago Uncontrolled type 2 diabetes mellitus with hyperglycemia, with long-term current use of insulin (HCC)   [place] Donita Brooks, MD   1 year ago Uncontrolled type 2 diabetes mellitus with hypoglycemia without coma [facility])   [place] [facility] Medicine Pickard, Priscille Heidelberg, MD   1 year ago Right leg swelling   [facility] Medicine Pickard, Priscille Heidelberg, MD   1 year ago Boil   [place] [facility] Tanya Nones, Priscille Heidelberg, MD   1 year ago Depression, major, single episode, moderate [facility])   [place] Medicine Pickard, Priscille Heidelberg, MD               diazepam (VALIUM) 5 MG tablet [Pharmacy Med Name: DIAZEPAM 5MG  TABLET] 60 tablet 1    Sig: TAKE ONE TABLET BY MOUTH EVERY SIX HOURS AS NEEDED FOR ANXIETY. DO NOT TAKE WITH AMBIEN     Not Delegated - Psychiatry: Anxiolytics/Hypnotics 2 Failed - 10/19/2023  9:51 AM      Failed - This refill cannot be delegated      Failed - Urine Drug Screen completed in last 360 days      Failed - Valid encounter within last 6 months    Recent Outpatient Visits           6 months ago Uncontrolled  type 2 diabetes mellitus with hyperglycemia, with long-term current use of insulin (HCC)   [place] [facility] Medicine Donita Brooks, MD   1 year ago Uncontrolled type 2 diabetes mellitus with hypoglycemia without coma [facility])   [place] [facility] Family Medicine Pickard, Priscille Heidelberg, MD   1 year ago Right leg swelling   [place] [facility] Medicine Pickard, Priscille Heidelberg, MD   1 year ago Boil   [place] [facility] Medicine Tanya Nones, Priscille Heidelberg, MD   1 year ago Depression, major, single episode, moderate [facility])   [place] [facility] Medicine Pickard, Priscille Heidelberg, MD              Passed - Patient is not pregnant      Signed Prescriptions Disp Refills   omeprazole (PRILOSEC) 20 MG capsule 90 capsule 0    Sig: TAKE ONE CAPSULE (20 MG TOTAL) BY MOUTH DAILY.     Gastroenterology: Proton Pump Inhibitors Passed - 10/19/2023  9:51 AM      Passed - Valid encounter within last 12 months    Recent Outpatient  Visits           6 months ago Uncontrolled type 2 diabetes mellitus with hyperglycemia, with long-term current use of insulin [facility])   [place] [facility] Medicine Austine Lefort, MD   1 year ago Uncontrolled type 2 diabetes mellitus with hypoglycemia without coma [facility])   [place] [facility] Pickard, Cisco Crest, MD   1 year ago Right leg swelling   [place] [facility] Medicine Pickard, Cisco Crest, MD   1 year ago Boil   [place] [facility] Medicine Austine Lefort, MD   1 year ago Depression, major, single episode, moderate [facility])   [place] [facility] Medicine Pickard, Cisco Crest, MD

## 2023-10-21 ENCOUNTER — Ambulatory Visit: Admitting: Family Medicine

## 2023-11-11 ENCOUNTER — Ambulatory Visit: Admitting: Family Medicine

## 2023-11-23 ENCOUNTER — Other Ambulatory Visit: Payer: Self-pay | Admitting: Family Medicine

## 2023-11-24 NOTE — Telephone Encounter (Signed)
 Requested Prescriptions  Pending Prescriptions Disp Refills   potassium chloride  SA (KLOR-CON  M) 20 MEQ tablet [Pharmacy Med Name: POTASSIUM CHLORIDE  ER ER TABLET ER] 180 tablet 0    Sig: TAKE ONE TABLET BY MOUTH TWICE A DAY     Endocrinology:  Minerals - Potassium Supplementation Passed - 11/24/2023  3:33 PM      Passed - K in normal range and within 360 days    Potassium  Date Value Ref Range Status  04/04/2023 4.5 3.5 - 5.3 mmol/L Final  12/25/2015 3.8 3.5 - 5.1 mEq/L Final         Passed - Cr in normal range and within 360 days    Creatinine  Date Value Ref Range Status  12/25/2015 1.4 (H) 0.7 - 1.3 mg/dL Final   Creat  Date Value Ref Range Status  04/04/2023 1.23 0.70 - 1.28 mg/dL Final   Creatinine,U  Date Value Ref Range Status  03/17/2009 155.8 mg/dL Final   Creatinine, Urine  Date Value Ref Range Status  04/04/2023 CANCELED      Comment:    TEST NOT PERFORMED . No urine received.  Result canceled by the ancillary.          Passed - Valid encounter within last 12 months    Recent Outpatient Visits           7 months ago Uncontrolled type 2 diabetes mellitus with hyperglycemia, with long-term current use of insulin  [facility])   [place] [facility] Austine Lefort, MD   1 year ago Uncontrolled type 2 diabetes mellitus with hypoglycemia without coma [facility])   [place] [facility] Pickard, Cisco Crest, MD   1 year ago Right leg swelling   [place] [facility] Medicine Austine Lefort, MD   1 year ago Boil   [place] [facility] Medicine Austine Lefort, MD   1 year ago Depression, major, single episode, moderate [facility])   [place] [facility] Family Medicine Pickard, Cisco Crest, MD

## 2023-12-06 ENCOUNTER — Ambulatory Visit: Admitting: Family Medicine

## 2023-12-15 ENCOUNTER — Other Ambulatory Visit: Payer: Self-pay | Admitting: Family Medicine

## 2023-12-15 DIAGNOSIS — I10 Essential (primary) hypertension: Secondary | ICD-10-CM

## 2023-12-20 ENCOUNTER — Other Ambulatory Visit: Payer: Self-pay | Admitting: Family Medicine

## 2023-12-20 DIAGNOSIS — I10 Essential (primary) hypertension: Secondary | ICD-10-CM

## 2023-12-28 ENCOUNTER — Other Ambulatory Visit: Payer: Self-pay | Admitting: Family Medicine

## 2023-12-28 DIAGNOSIS — J439 Emphysema, unspecified: Secondary | ICD-10-CM

## 2023-12-28 DIAGNOSIS — J9611 Chronic respiratory failure with hypoxia: Secondary | ICD-10-CM

## 2023-12-29 ENCOUNTER — Other Ambulatory Visit: Payer: Self-pay | Admitting: Family Medicine

## 2023-12-29 NOTE — Telephone Encounter (Signed)
 Requested Prescriptions  Pending Prescriptions Disp Refills   umeclidinium-vilanterol (ANORO ELLIPTA ) 62.5-25 MCG/ACT AEPB [Pharmacy Med Name: ANORO ELLIPTA  62.5-25 AERO POW BR ACT] 1 each 2    Sig: INHALE ONE PUFF INTO THE LUNGS DAILY.     Pulmonology:  Combination Products Passed - 12/29/2023  5:23 PM      Passed - Valid encounter within last 12 months    Recent Outpatient Visits           8 months ago Uncontrolled type 2 diabetes mellitus with hyperglycemia, with long-term current use of insulin  [facility])   [place] [facility] Duanne Butler DASEN, MD   1 year ago Uncontrolled type 2 diabetes mellitus with hypoglycemia without coma [facility])   [place] [facility] Medicine Duanne Butler DASEN, MD   1 year ago Right leg swelling   [place] [facility] Family Medicine Pickard, Butler DASEN, MD   1 year ago Boil   [facility] Medicine Duanne Butler DASEN, MD   2 years ago Depression, major, single episode, moderate [facility])    [facility] Family Medicine Pickard, Butler DASEN, MD

## 2023-12-30 ENCOUNTER — Other Ambulatory Visit: Payer: Self-pay | Admitting: Family Medicine

## 2023-12-30 DIAGNOSIS — E1165 Type 2 diabetes mellitus with hyperglycemia: Secondary | ICD-10-CM

## 2024-01-11 ENCOUNTER — Other Ambulatory Visit: Payer: Self-pay | Admitting: Family Medicine

## 2024-01-11 DIAGNOSIS — K219 Gastro-esophageal reflux disease without esophagitis: Secondary | ICD-10-CM

## 2024-01-13 ENCOUNTER — Ambulatory Visit: Admitting: Family Medicine

## 2024-01-20 ENCOUNTER — Encounter: Payer: Self-pay | Admitting: Advanced Practice Midwife

## 2024-02-02 ENCOUNTER — Ambulatory Visit: Admitting: Family Medicine

## 2024-03-01 ENCOUNTER — Ambulatory Visit: Admitting: Family Medicine

## 2024-03-02 ENCOUNTER — Ambulatory Visit: Admitting: Family Medicine

## 2024-03-07 ENCOUNTER — Other Ambulatory Visit: Payer: Self-pay | Admitting: Family Medicine

## 2024-03-16 ENCOUNTER — Ambulatory Visit: Admitting: Family Medicine

## 2024-03-19 ENCOUNTER — Other Ambulatory Visit: Payer: Self-pay

## 2024-03-19 ENCOUNTER — Other Ambulatory Visit: Payer: Self-pay | Admitting: Family Medicine

## 2024-03-19 ENCOUNTER — Telehealth: Payer: Self-pay

## 2024-03-19 DIAGNOSIS — I10 Essential (primary) hypertension: Secondary | ICD-10-CM

## 2024-03-19 MED ORDER — CLONIDINE HCL 0.3 MG PO TABS
0.3000 mg | ORAL_TABLET | Freq: Three times a day (TID) | ORAL | 0 refills | Status: DC
Start: 2024-03-19 — End: 2024-03-19

## 2024-03-19 MED ORDER — CLONIDINE HCL 0.3 MG PO TABS: 0.3000 mg | ORAL_TABLET | Freq: Three times a day (TID) | ORAL | 11 refills | Status: AC

## 2024-03-19 NOTE — Telephone Encounter (Signed)
 30 day supply sent to cover until pt's appointment on 04/03/2024. Mjp,lpn  Copied from CRM [id number]. Topic: Clinical - Medication Question >> Mar 19, 2024 11:36 AM Tobias CROME wrote: Reason for CRM: [place] with [facility] pharmacy called in regards to request for patient's clonidine . [place] was informed by patient's wife that the patient is completely out of the prescription. Requesting for rx to be expedited and sent in as soon as possible.   Chris aware of turnaround time

## 2024-03-20 NOTE — Telephone Encounter (Signed)
 Requested Prescriptions  Refused Prescriptions Disp Refills   cloNIDine  (CATAPRES ) 0.3 MG tablet [Pharmacy Med Name: CLONIDINE  HYDROCHLORIDE 0.3MG  TABLET] 270 tablet 0    Sig: TAKE ONE TABLET BY MOUTH THREE TIMES DAILY.     Cardiovascular:  Alpha-2 Agonists Failed - 03/20/2024  3:05 PM      Failed - Last BP in normal range    BP Readings from Last 1 Encounters:  04/04/23 (!) 148/84         Failed - Valid encounter within last 6 months    Recent Outpatient Visits           11 months ago Uncontrolled type 2 diabetes mellitus with hyperglycemia, with long-term current use of insulin  [facility])   [place] [facility] Medicine Duanne Butler DASEN, MD   1 year ago Uncontrolled type 2 diabetes mellitus with hypoglycemia without coma [facility])   [place] [facility] Duanne Butler DASEN, MD   2 years ago Right leg swelling   [place] [facility] Duanne, Butler DASEN, MD   2 years ago Boil   [place] [facility] Family Medicine Duanne Butler DASEN, MD   2 years ago Depression, major, single episode, moderate [facility])   [place] [facility] Family Medicine Duanne Butler DASEN, MD              Passed - Last Heart Rate in normal range    Pulse Readings from Last 1 Encounters:  04/04/23 77

## 2024-03-24 ENCOUNTER — Other Ambulatory Visit: Payer: Self-pay | Admitting: Family Medicine

## 2024-03-24 DIAGNOSIS — I10 Essential (primary) hypertension: Secondary | ICD-10-CM

## 2024-03-26 ENCOUNTER — Other Ambulatory Visit: Payer: Self-pay | Admitting: Family Medicine

## 2024-03-26 DIAGNOSIS — K219 Gastro-esophageal reflux disease without esophagitis: Secondary | ICD-10-CM

## 2024-03-26 DIAGNOSIS — I10 Essential (primary) hypertension: Secondary | ICD-10-CM

## 2024-03-26 NOTE — Telephone Encounter (Signed)
 Requested Prescriptions  Pending Prescriptions Disp Refills   amLODipine -benazepril  (LOTREL) 10-40 MG capsule [Pharmacy Med Name: AMLODIPINE  BESYLATE/BENAZEPRIL  HYDROCHLORIDE 10-40MG  CAPSULE] 90 capsule 0    Sig: TAKE ONE CAPSULE BY MOUTH DAILY.     Cardiovascular: CCB + ACEI Combos Failed - 03/26/2024  2:34 PM      Failed - Cr in normal range and within 180 days    Creatinine  Date Value Ref Range Status  12/25/2015 1.4 (H) 0.7 - 1.3 mg/dL Final   Creat  Date Value Ref Range Status  04/04/2023 1.23 0.70 - 1.28 mg/dL Final   Creatinine,U  Date Value Ref Range Status  03/17/2009 155.8 mg/dL Final   Creatinine, Urine  Date Value Ref Range Status  04/04/2023 CANCELED      Comment:    TEST NOT PERFORMED . No urine received.  Result canceled by the ancillary.          Failed - K in normal range and within 180 days    Potassium  Date Value Ref Range Status  04/04/2023 4.5 3.5 - 5.3 mmol/L Final  12/25/2015 3.8 3.5 - 5.1 mEq/L Final         Failed - Na in normal range and within 180 days    Sodium  Date Value Ref Range Status  04/04/2023 138 135 - 146 mmol/L Final  12/25/2015 141 136 - 145 mEq/L Final         Failed - eGFR is 30 or above and within 180 days    GFR, Est African American  Date Value Ref Range Status  10/20/2020 55 (L) > OR = 60 mL/min/1.27m2 Final   GFR, Est Non African American  Date Value Ref Range Status  10/20/2020 47 (L) > OR = 60 mL/min/1.32m2 Final   GFR  Date Value Ref Range Status  11/05/2011 61.90 >60.00 mL/min Final   eGFR  Date Value Ref Range Status  04/04/2023 62 > OR = 60 mL/min/1.69m2 Final         Failed - Last BP in normal range    BP Readings from Last 1 Encounters:  04/04/23 (!) 148/84         Failed - Valid encounter within last 6 months    Recent Outpatient Visits           11 months ago Uncontrolled type 2 diabetes mellitus with hyperglycemia, with long-term current use of insulin  (HCC)   [place] [facility]  Family Medicine Duanne Butler DASEN, MD   1 year ago Uncontrolled type 2 diabetes mellitus with hypoglycemia without coma [facility])   [facility] Family Medicine Duanne Butler DASEN, MD   2 years ago Right leg swelling   [place] [facility] Family Medicine Pickard, Butler DASEN, MD   2 years ago Boil   [place] [facility] Family Medicine Duanne Butler DASEN, MD   2 years ago Depression, major, single episode, moderate [facility])    [facility] Family Medicine Pickard, Butler DASEN, MD              Passed - Patient is not pregnant       nebivolol  (BYSTOLIC ) 5 MG tablet [Pharmacy Med Name: NEBIVOLOL  HYDROCHLORIDE 5MG  TABLET] 90 tablet 0    Sig: TAKE ONE TABLET (5 MG TOTAL) BY MOUTH DAILY.     Cardiovascular: Beta Blockers 3 Failed - 03/26/2024  2:34 PM      Failed - Last BP in normal range    BP Readings from  Last 1 Encounters:  04/04/23 (!) 148/84         Failed - Valid encounter within last 6 months    Recent Outpatient Visits           11 months ago Uncontrolled type 2 diabetes mellitus with hyperglycemia, with long-term current use of insulin  [facility])   [place] [facility] Family Medicine Duanne Butler DASEN, MD   1 year ago Uncontrolled type 2 diabetes mellitus with hypoglycemia without coma [facility])   [place] [facility] Medicine Duanne Butler DASEN, MD   2 years ago Right leg swelling   [place] [facility] Family Medicine Pickard, Butler DASEN, MD   2 years ago Boil   [place] [facility] Family Medicine Duanne Butler DASEN, MD   2 years ago Depression, major, single episode, moderate [facility])   [place] [facility] Family Medicine Pickard, Butler DASEN, MD              Passed - Cr in normal range and within 360 days    Creatinine  Date Value Ref Range Status  12/25/2015 1.4 (H) 0.7 - 1.3 mg/dL Final   Creat  Date Value Ref Range Status  04/04/2023 1.23 0.70 - 1.28 mg/dL Final   Creatinine,U  Date Value Ref Range Status  03/17/2009  155.8 mg/dL Final   Creatinine, Urine  Date Value Ref Range Status  04/04/2023 CANCELED      Comment:    TEST NOT PERFORMED . No urine received.  Result canceled by the ancillary.          Passed - AST in normal range and within 360 days    AST  Date Value Ref Range Status  04/04/2023 18 10 - 35 U/L Final  12/25/2015 22 5 - 34 U/L Final         Passed - ALT in normal range and within 360 days    ALT  Date Value Ref Range Status  04/04/2023 22 9 - 46 U/L Final  12/25/2015 29 0 - 55 U/L Final         Passed - Last Heart Rate in normal range    Pulse Readings from Last 1 Encounters:  04/04/23 77

## 2024-03-27 NOTE — Telephone Encounter (Signed)
 Requested Prescriptions  Pending Prescriptions Disp Refills   omeprazole  (PRILOSEC) 20 MG capsule [Pharmacy Med Name: OMEPRAZOLE  20MG  CAPSULE DR] 90 capsule 0    Sig: TAKE ONE CAPSULE (20 MG TOTAL) BY MOUTH DAILY.     Gastroenterology: Proton Pump Inhibitors Passed - 03/27/2024 10:29 AM      Passed - Valid encounter within last 12 months    Recent Outpatient Visits           11 months ago Uncontrolled type 2 diabetes mellitus with hyperglycemia, with long-term current use of insulin  [facility])   [facility] Medicine Duanne Butler DASEN, MD   1 year ago Uncontrolled type 2 diabetes mellitus with hypoglycemia without coma [facility])   [place] [facility] Medicine Duanne Butler DASEN, MD   2 years ago Right leg swelling   [place] [facility] Family Medicine Duanne, Butler DASEN, MD   2 years ago Boil   [place] [facility] Family Medicine Duanne Butler DASEN, MD   2 years ago Depression, major, single episode, moderate [facility])   [place] [facility] Family Medicine Pickard, Butler DASEN, MD               furosemide  (LASIX ) 40 MG tablet [Pharmacy Med Name: FUROSEMIDE  40MG  TABLET] 180 tablet 0    Sig: TAKE ONE TABLET (40 MG TOTAL) BY MOUTH TWO (TWO) TIMES DAILY.     Cardiovascular:  Diuretics - Loop Failed - 03/27/2024 10:29 AM      Failed - K in normal range and within 180 days    Potassium  Date Value Ref Range Status  04/04/2023 4.5 3.5 - 5.3 mmol/L Final  12/25/2015 3.8 3.5 - 5.1 mEq/L Final         Failed - Ca in normal range and within 180 days    Calcium   Date Value Ref Range Status  04/04/2023 9.5 8.6 - 10.3 mg/dL Final  93/77/7982 9.0 8.4 - 10.4 mg/dL Final         Failed - Na in normal range and within 180 days    Sodium  Date Value Ref Range Status  04/04/2023 138 135 - 146 mmol/L Final  12/25/2015 141 136 - 145 mEq/L Final         Failed - Cr in normal range and within 180 days    Creatinine  Date Value Ref Range Status  12/25/2015 1.4  (H) 0.7 - 1.3 mg/dL Final   Creat  Date Value Ref Range Status  04/04/2023 1.23 0.70 - 1.28 mg/dL Final   Creatinine,U  Date Value Ref Range Status  03/17/2009 155.8 mg/dL Final   Creatinine, Urine  Date Value Ref Range Status  04/04/2023 CANCELED      Comment:    TEST NOT PERFORMED . No urine received.  Result canceled by the ancillary.          Failed - Cl in normal range and within 180 days    Chloride  Date Value Ref Range Status  04/04/2023 99 98 - 110 mmol/L Final  12/25/2015 105 98 - 109 mEq/L Final         Failed - Mg Level in normal range and within 180 days    No results found for: MG       Failed - Last BP in normal range    BP Readings from Last 1 Encounters:  04/04/23 (!) 148/84         Failed - Valid encounter within last 6  months    Recent Outpatient Visits           11 months ago Uncontrolled type 2 diabetes mellitus with hyperglycemia, with long-term current use of insulin  [facility])   [place] [facility] Family Medicine Duanne Butler DASEN, MD   1 year ago Uncontrolled type 2 diabetes mellitus with hypoglycemia without coma [facility])   [place] [facility] Duanne Butler DASEN, MD   2 years ago Right leg swelling   [facility] Family Medicine Duanne, Butler DASEN, MD   2 years ago Boil   [place] [facility] Family Medicine Duanne Butler DASEN, MD   2 years ago Depression, major, single episode, moderate [facility])    [facility] Family Medicine Pickard, Butler DASEN, MD

## 2024-04-03 ENCOUNTER — Ambulatory Visit: Admitting: Family Medicine

## 2024-04-03 ENCOUNTER — Encounter: Payer: Self-pay | Admitting: Family Medicine

## 2024-04-03 VITALS — BP 126/82 | HR 65 | Temp 97.8°F | Ht 75.0 in | Wt 328.0 lb

## 2024-04-03 DIAGNOSIS — R0600 Dyspnea, unspecified: Secondary | ICD-10-CM
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
## 2024-04-04 LAB — COMPREHENSIVE METABOLIC PANEL WITH GFR
AG Ratio: 1.9 (calc) (ref 1.0–2.5)
ALT: 22 U/L (ref 9–46)
AST: 16 U/L (ref 10–35)
Albumin: 4.5 g/dL (ref 3.6–5.1)
Alkaline phosphatase (APISO): 67 U/L (ref 35–144)
BUN: 18 mg/dL (ref 7–25)
CO2: 28 mmol/L (ref 20–32)
Calcium: 9.6 mg/dL (ref 8.6–10.3)
Chloride: 101 mmol/L (ref 98–110)
Creat: 1.25 mg/dL (ref 0.70–1.28)
Globulin: 2.4 g/dL (ref 1.9–3.7)
Glucose, Bld: 135 mg/dL — ABNORMAL HIGH (ref 65–99)
Potassium: 4.5 mmol/L (ref 3.5–5.3)
Sodium: 140 mmol/L (ref 135–146)
Total Bilirubin: 0.6 mg/dL (ref 0.2–1.2)
Total Protein: 6.9 g/dL (ref 6.1–8.1)
eGFR: 60 mL/min/1.73m2 (ref >=60)

## 2024-04-04 LAB — CBC WITH DIFFERENTIAL/PLATELET
Absolute Lymphocytes: 1825 {cells}/uL (ref 850–3900)
Absolute Monocytes: 788 {cells}/uL (ref 200–950)
Basophils Absolute: 32 {cells}/uL (ref 0–200)
Basophils Relative: 0.3 %
Eosinophils Absolute: 205 {cells}/uL (ref 15–500)
Eosinophils Relative: 1.9 %
HCT: 50.9 % — ABNORMAL HIGH (ref 38.5–50.0)
Hemoglobin: 16.9 g/dL (ref 13.2–17.1)
MCH: 30.3 pg (ref 27.0–33.0)
MCHC: 33.2 g/dL (ref 32.0–36.0)
MCV: 91.2 fL (ref 80.0–100.0)
MPV: 9.5 fL (ref 7.5–12.5)
Monocytes Relative: 7.3 %
Neutro Abs: 7949 {cells}/uL — ABNORMAL HIGH (ref 1500–7800)
Neutrophils Relative %: 73.6 %
Platelets: 304 Thousand/uL (ref 140–400)
RBC: 5.58 Million/uL (ref 4.20–5.80)
RDW: 12.7 % (ref 11.0–15.0)
Total Lymphocyte: 16.9 %
WBC: 10.8 Thousand/uL (ref 3.8–10.8)

## 2024-04-04 LAB — HEMOGLOBIN A1C
Hgb A1c MFr Bld: 8 % — ABNORMAL HIGH (ref <5.7)
Mean Plasma Glucose: 183 mg/dL
eAG (mmol/L): 10.1 mmol/L

## 2024-04-04 LAB — TSH: TSH: 1.65 m[IU]/L (ref 0.40–4.50)

## 2024-04-04 LAB — BRAIN NATRIURETIC PEPTIDE: Brain Natriuretic Peptide: 34 pg/mL (ref <100)

## 2024-04-05 ENCOUNTER — Ambulatory Visit: Payer: Self-pay | Admitting: Family Medicine

## 2024-04-06 ENCOUNTER — Telehealth: Payer: Self-pay

## 2024-04-06 ENCOUNTER — Ambulatory Visit: Admitting: Family Medicine

## 2024-04-06 ENCOUNTER — Encounter: Payer: Self-pay | Admitting: Family Medicine

## 2024-04-06 VITALS — BP 132/74 | HR 72 | Temp 97.5°F | Ht 75.0 in | Wt 328.4 lb

## 2024-04-06 DIAGNOSIS — Z794 Long term (current) use of insulin: Secondary | ICD-10-CM

## 2024-04-06 DIAGNOSIS — E1165 Type 2 diabetes mellitus with hyperglycemia: Secondary | ICD-10-CM

## 2024-04-06 DIAGNOSIS — R110 Nausea: Secondary | ICD-10-CM

## 2024-04-06 MED ORDER — METOCLOPRAMIDE HCL 10 MG PO TABS: 10.0000 mg | ORAL_TABLET | Freq: Three times a day (TID) | ORAL | 0 refills | Status: AC

## 2024-04-06 NOTE — Telephone Encounter (Signed)
 Copied from CRM #[id number]. Topic: General - Other >> Apr 06, 2024 12:25 PM Willma R wrote: Reason for CRM: Patients wife is on the line requesting to speak with Ronal Bradley. Would like to discuss a few things with her prior to patient being seen today.  Adrien can be reached at [phone] or [phone]
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
## 2024-04-10 ENCOUNTER — Other Ambulatory Visit: Payer: Self-pay | Admitting: Family Medicine

## 2024-04-10 DIAGNOSIS — K219 Gastro-esophageal reflux disease without esophagitis: Secondary | ICD-10-CM

## 2024-04-25 ENCOUNTER — Other Ambulatory Visit: Payer: Self-pay | Admitting: Family Medicine

## 2024-04-25 DIAGNOSIS — K219 Gastro-esophageal reflux disease without esophagitis: Secondary | ICD-10-CM

## 2024-05-29 ENCOUNTER — Other Ambulatory Visit: Payer: Self-pay | Admitting: Family Medicine

## 2024-05-29 DIAGNOSIS — E1165 Type 2 diabetes mellitus with hyperglycemia: Secondary | ICD-10-CM

## 2024-05-30 NOTE — Telephone Encounter (Signed)
 Requested Prescriptions  Pending Prescriptions Disp Refills   insulin  lispro (HUMALOG ) 100 UNIT/ML KwikPen [Pharmacy Med Name: INSULIN  LISPRO KWIKPEN 100/ML SOLN PEN-INJ] 150 mL 1    Sig: INJECT SUBCUTANEOUSLY 80 UNITS EVERY MORNING, 60 UNITS EVERY NOON AND 25 UNITS EVERY NIGHT AT BEDTIME PER SLIDINGSCALE     Endocrinology:  Diabetes - Insulins Failed - 05/30/2024 11:55 AM      Failed - HBA1C is between 0 and 7.9 and within 180 days    Hgb A1c MFr Bld  Date Value Ref Range Status  04/03/2024 8.0 (H) <5.7 % Final    Comment:    For someone without known diabetes, a hemoglobin A1c value of 6.5% or greater indicates that they may have  diabetes and this should be confirmed with a follow-up  test. . For someone with known diabetes, a value <7% indicates  that their diabetes is well controlled and a value  greater than or equal to 7% indicates suboptimal  control. A1c targets should be individualized based on  duration of diabetes, age, comorbid conditions, and  other considerations. . Currently, no consensus exists regarding use of hemoglobin A1c for diagnosis of diabetes for children. SABRA Amy - Valid encounter within last 6 months    Recent Outpatient Visits           1 month ago Nausea   [place] [facility] Pickard, Butler DASEN, MD   1 month ago Dyspnea, unspecified type   [place] [facility] Duanne Butler DASEN, MD   1 year ago Uncontrolled type 2 diabetes mellitus with hyperglycemia, with long-term current use of insulin  [facility])   [facility] Duanne Butler DASEN, MD   1 year ago Uncontrolled type 2 diabetes mellitus with hypoglycemia without coma [facility])   [facility] [facility] Duanne Butler DASEN, MD   2 years ago Right leg swelling    [facility] Pickard, Butler DASEN, MD

## 2024-06-01 ENCOUNTER — Other Ambulatory Visit: Payer: Self-pay | Admitting: Family Medicine

## 2024-06-15 ENCOUNTER — Other Ambulatory Visit: Payer: Self-pay | Admitting: Family Medicine

## 2024-06-15 DIAGNOSIS — I10 Essential (primary) hypertension: Secondary | ICD-10-CM

## 2024-06-18 ENCOUNTER — Other Ambulatory Visit: Payer: Self-pay

## 2024-06-18 DIAGNOSIS — I10 Essential (primary) hypertension: Secondary | ICD-10-CM

## 2024-06-18 MED ORDER — DIAZEPAM 5 MG PO TABS: ORAL_TABLET | ORAL | 4 refills | Status: AC

## 2024-06-18 MED ORDER — FUROSEMIDE 40 MG PO TABS: 40.0000 mg | ORAL_TABLET | Freq: Two times a day (BID) | ORAL | 1 refills | Status: AC

## 2024-06-18 MED ORDER — AMLODIPINE BESY-BENAZEPRIL HCL 10-40 MG PO CAPS: 1.0000 | ORAL_CAPSULE | Freq: Every day | ORAL | 0 refills | Status: DC

## 2024-06-18 MED ORDER — ZOLPIDEM TARTRATE ER 12.5 MG PO TBCR: 12.5000 mg | EXTENDED_RELEASE_TABLET | Freq: Every evening | ORAL | 4 refills | Status: AC | PRN

## 2024-06-25 ENCOUNTER — Other Ambulatory Visit: Payer: Self-pay | Admitting: Family Medicine

## 2024-06-25 DIAGNOSIS — I10 Essential (primary) hypertension: Secondary | ICD-10-CM

## 2024-06-30 ENCOUNTER — Other Ambulatory Visit: Payer: Self-pay | Admitting: Family Medicine

## 2024-06-30 DIAGNOSIS — E1165 Type 2 diabetes mellitus with hyperglycemia: Secondary | ICD-10-CM

## 2024-07-09 ENCOUNTER — Telehealth: Payer: Self-pay

## 2024-07-09 DIAGNOSIS — E1165 Type 2 diabetes mellitus with hyperglycemia: Secondary | ICD-10-CM

## 2024-07-09 MED ORDER — CONTOUR BLOOD GLUCOSE SYSTEM W/DEVICE KIT: PACK | 0 refills | Status: AC

## 2024-07-09 NOTE — Telephone Encounter (Signed)
 Copied from CRM [id number]. Topic: Clinical - Prescription Issue >> Jul 09, 2024  3:27 PM Antwanette L wrote: Reason for CRM: Adrien the patient wife , is calling b/c the patient was informed by [employer] that starting on 07/05/24 they will no longer covers OneTouch Ultra test strips. Alternatives covered options include Contour Next, Contour Plus Blue, Or Accu-Check test strips and meter.

## 2024-07-16 ENCOUNTER — Other Ambulatory Visit: Payer: Self-pay | Admitting: Family Medicine

## 2024-07-16 DIAGNOSIS — E1169 Type 2 diabetes mellitus with other specified complication: Secondary | ICD-10-CM

## 2024-07-16 NOTE — Telephone Encounter (Unsigned)
 Copied from CRM #[id number]. Topic: Clinical - Medication Refill >> Jul 16, 2024  8:45 AM Amy B wrote: Medication:  pravastatin  (PRAVACHOL ) 40 MG tablet  Has the patient contacted their pharmacy? Yes (Agent: If no, request that the patient contact the pharmacy for the refill. If patient does not wish to contact the pharmacy document the reason why and proceed with request.) (Agent: If yes, when and what did the pharmacy advise?)  This is the patient's preferred pharmacy:  [facility] - [place], [place] - [address] [place] [address] Phone: [phone] Fax: [phone]  Is this the correct pharmacy for this prescription? Yes If no, delete pharmacy and type the correct one.   Has the prescription been filled recently? No  Is the patient out of the medication? No  Has the patient been seen for an appointment in the last year OR does the patient have an upcoming appointment? Yes  Can we respond through MyChart? Yes  Agent: Please be advised that Rx refills may take up to 3 business days. We ask that you follow-up with your pharmacy.

## 2024-07-17 NOTE — Telephone Encounter (Signed)
 Duplicate request.  Requested Prescriptions  Pending Prescriptions Disp Refills   pravastatin  (PRAVACHOL ) 40 MG tablet 90 tablet 3    Sig: TAKE ONE TABLET BY MOUTH ONCE A DAY (NEED APPT FOR FUTURE REFILLS)     Cardiovascular:  Antilipid - Statins Failed - 07/17/2024 11:17 AM      Failed - Lipid Panel in normal range within the last 12 months    Cholesterol  Date Value Ref Range Status  12/01/2021 166 <200 mg/dL Final   LDL Cholesterol (Calc)  Date Value Ref Range Status  12/01/2021 82 mg/dL (calc) Final    Comment:    Reference range: <100 . Desirable range <100 mg/dL for primary prevention;   <70 mg/dL for patients with CHD or diabetic patients  with > or = 2 CHD risk factors. SABRA LDL-C is now calculated using the Martin-Hopkins  calculation, which is a validated novel method providing  better accuracy than the Friedewald equation in the  estimation of LDL-C.  Gladis APPLETHWAITE et al. SANDREA. 7986;689(80): 2061-2068  (http://education.[facility].com/faq/FAQ164)    HDL  Date Value Ref Range Status  12/01/2021 53 > OR = 40 mg/dL Final   Triglycerides  Date Value Ref Range Status  12/01/2021 213 (H) <150 mg/dL Final    Comment:    . If a non-fasting specimen was collected, consider repeat triglyceride testing on a fasting specimen if clinically indicated.  Veatrice et al. J. of Clin. Lipidol. 2015;9:129-169. SABRA          Passed - Patient is not pregnant      Passed - Valid encounter within last 12 months    Recent Outpatient Visits           3 months ago Nausea   [place] [facility] Pickard, Butler DASEN, MD   3 months ago Dyspnea, unspecified type   [place] [facility] Duanne Butler DASEN, MD   1 year ago Uncontrolled type 2 diabetes mellitus with hyperglycemia, with long-term current use of insulin  [facility])   [place] [facility] Medicine Duanne Butler DASEN, MD   2 years ago Uncontrolled type 2 diabetes mellitus with  hypoglycemia without coma [facility])   [place] [facility] Medicine Duanne Butler DASEN, MD   2 years ago Right leg swelling   [facility] Pickard, Butler DASEN, MD

## 2024-07-18 ENCOUNTER — Other Ambulatory Visit: Payer: Self-pay | Admitting: Family Medicine

## 2024-07-19 ENCOUNTER — Other Ambulatory Visit: Payer: Self-pay | Admitting: Family Medicine

## 2024-07-19 DIAGNOSIS — I10 Essential (primary) hypertension: Secondary | ICD-10-CM

## 2024-07-20 ENCOUNTER — Ambulatory Visit: Admitting: Family Medicine

## 2024-08-03 ENCOUNTER — Ambulatory Visit: Admitting: Family Medicine

## 2024-08-08 ENCOUNTER — Other Ambulatory Visit: Payer: Self-pay | Admitting: Family Medicine

## 2024-08-10 ENCOUNTER — Ambulatory Visit: Admitting: Family Medicine

## 2024-08-31 ENCOUNTER — Ambulatory Visit: Admitting: Family Medicine
